# Patient Record
Sex: Male | Born: 1965
Health system: Southern US, Community
[De-identification: ages and names within clinical notes are randomized; demographics above are authoritative.]

## PROBLEM LIST (undated history)

## (undated) DIAGNOSIS — Q249 Congenital malformation of heart, unspecified: Secondary | ICD-10-CM

## (undated) DIAGNOSIS — I10 Essential (primary) hypertension: Secondary | ICD-10-CM

## (undated) DIAGNOSIS — I2699 Other pulmonary embolism without acute cor pulmonale: Secondary | ICD-10-CM

## (undated) DIAGNOSIS — F1010 Alcohol abuse, uncomplicated: Secondary | ICD-10-CM

## (undated) DIAGNOSIS — R079 Chest pain, unspecified: Secondary | ICD-10-CM

## (undated) DIAGNOSIS — G8929 Other chronic pain: Secondary | ICD-10-CM

## (undated) DIAGNOSIS — F329 Major depressive disorder, single episode, unspecified: Secondary | ICD-10-CM

## (undated) DIAGNOSIS — D369 Benign neoplasm, unspecified site: Secondary | ICD-10-CM

## (undated) DIAGNOSIS — Q231 Congenital insufficiency of aortic valve: Secondary | ICD-10-CM

## (undated) DIAGNOSIS — I4510 Unspecified right bundle-branch block: Secondary | ICD-10-CM

## (undated) DIAGNOSIS — Z720 Tobacco use: Secondary | ICD-10-CM

## (undated) DIAGNOSIS — F32A Depression, unspecified: Secondary | ICD-10-CM

## (undated) DIAGNOSIS — I4891 Unspecified atrial fibrillation: Secondary | ICD-10-CM

## (undated) DIAGNOSIS — Q2381 Bicuspid aortic valve: Secondary | ICD-10-CM

## (undated) DIAGNOSIS — F1910 Other psychoactive substance abuse, uncomplicated: Secondary | ICD-10-CM

## (undated) DIAGNOSIS — F1410 Cocaine abuse, uncomplicated: Secondary | ICD-10-CM

## (undated) DIAGNOSIS — F419 Anxiety disorder, unspecified: Secondary | ICD-10-CM

## (undated) DIAGNOSIS — I2510 Atherosclerotic heart disease of native coronary artery without angina pectoris: Secondary | ICD-10-CM

## (undated) DIAGNOSIS — J449 Chronic obstructive pulmonary disease, unspecified: Secondary | ICD-10-CM

## (undated) HISTORY — DX: Unspecified atrial fibrillation: I48.91

## (undated) HISTORY — DX: Other chronic pain: G89.29

## (undated) HISTORY — DX: Bicuspid aortic valve: Q23.81

## (undated) HISTORY — DX: Cocaine abuse, uncomplicated: F14.10

## (undated) HISTORY — PX: CARDIAC SURGERY: SHX584

## (undated) HISTORY — DX: Unspecified right bundle-branch block: I45.10

## (undated) HISTORY — DX: Atherosclerotic heart disease of native coronary artery without angina pectoris: I25.10

## (undated) HISTORY — DX: Depression, unspecified: F32.A

## (undated) HISTORY — DX: Other pulmonary embolism without acute cor pulmonale: I26.99

## (undated) HISTORY — DX: Major depressive disorder, single episode, unspecified: F32.9

## (undated) HISTORY — DX: Benign neoplasm, unspecified site: D36.9

## (undated) HISTORY — DX: Anxiety disorder, unspecified: F41.9

## (undated) HISTORY — DX: Chest pain, unspecified: R07.9

## (undated) HISTORY — PX: TONSILLECTOMY: SUR1361

## (undated) HISTORY — DX: Congenital insufficiency of aortic valve: Q23.1

## (undated) HISTORY — PX: APPENDECTOMY: SHX54

---

## 2002-11-11 ENCOUNTER — Emergency Department (HOSPITAL_COMMUNITY): Admission: EM | Admit: 2002-11-11 | Discharge: 2002-11-11 | Payer: Self-pay | Admitting: Emergency Medicine

## 2002-11-30 ENCOUNTER — Emergency Department (HOSPITAL_COMMUNITY): Admission: EM | Admit: 2002-11-30 | Discharge: 2002-11-30 | Payer: Self-pay | Admitting: Emergency Medicine

## 2010-03-18 ENCOUNTER — Emergency Department (HOSPITAL_BASED_OUTPATIENT_CLINIC_OR_DEPARTMENT_OTHER): Admission: EM | Admit: 2010-03-18 | Discharge: 2010-03-18 | Payer: Self-pay | Admitting: Emergency Medicine

## 2010-03-18 ENCOUNTER — Ambulatory Visit: Payer: Self-pay | Admitting: Diagnostic Radiology

## 2010-07-23 LAB — POCT CARDIAC MARKERS: CKMB, poc: <1 ng/mL — ABNORMAL LOW (ref 1.0–8.0)

## 2010-07-23 LAB — DIFFERENTIAL
Eosinophils Absolute: 0.1 10*3/uL (ref 0.0–0.7)
Eosinophils Relative: 2 % (ref 0–5)
Lymphocytes Relative: 36 % (ref 12–46)
Monocytes Absolute: 0.9 10*3/uL (ref 0.1–1.0)
Neutrophils Relative %: 46 % (ref 43–77)

## 2010-07-23 LAB — BASIC METABOLIC PANEL
CO2: 27 mEq/L (ref 19–32)
Calcium: 8.8 mg/dL (ref 8.4–10.5)
Creatinine, Ser: 1.1 mg/dL (ref 0.4–1.5)
GFR calc Af Amer: >60 mL/min (ref >60)
GFR calc non Af Amer: >60 mL/min (ref >60)
Sodium: 147 mEq/L — ABNORMAL HIGH (ref 135–145)

## 2010-07-23 LAB — POCT TOXICOLOGY PANEL: Cocaine: POSITIVE

## 2010-07-23 LAB — CBC
Hemoglobin: 14.3 g/dL (ref 13.0–17.0)
MCH: 31.8 pg (ref 26.0–34.0)
Platelets: 307 10*3/uL (ref 150–400)
RBC: 4.51 MIL/uL (ref 4.22–5.81)

## 2010-07-23 LAB — POCT B-TYPE NATRIURETIC PEPTIDE (BNP): B Natriuretic Peptide, POC: <5 pg/mL (ref 0–100)

## 2012-10-16 DIAGNOSIS — K219 Gastro-esophageal reflux disease without esophagitis: Secondary | ICD-10-CM | POA: Insufficient documentation

## 2014-07-02 ENCOUNTER — Observation Stay (HOSPITAL_COMMUNITY): Payer: Self-pay

## 2014-07-02 ENCOUNTER — Inpatient Hospital Stay (HOSPITAL_COMMUNITY)
Admission: EM | Admit: 2014-07-02 | Discharge: 2014-07-04 | DRG: 176 | Disposition: A | Discharge status: Home / Self Care | Payer: Self-pay | Attending: Internal Medicine | Admitting: Internal Medicine

## 2014-07-02 ENCOUNTER — Emergency Department (HOSPITAL_COMMUNITY): Payer: Self-pay

## 2014-07-02 ENCOUNTER — Encounter (HOSPITAL_COMMUNITY): Payer: Self-pay | Admitting: Adult Health

## 2014-07-02 DIAGNOSIS — E785 Hyperlipidemia, unspecified: Secondary | ICD-10-CM | POA: Diagnosis present

## 2014-07-02 DIAGNOSIS — F1911 Other psychoactive substance abuse, in remission: Secondary | ICD-10-CM | POA: Diagnosis present

## 2014-07-02 DIAGNOSIS — Z8249 Family history of ischemic heart disease and other diseases of the circulatory system: Secondary | ICD-10-CM

## 2014-07-02 DIAGNOSIS — I4510 Unspecified right bundle-branch block: Secondary | ICD-10-CM | POA: Diagnosis present

## 2014-07-02 DIAGNOSIS — F17210 Nicotine dependence, cigarettes, uncomplicated: Secondary | ICD-10-CM | POA: Diagnosis present

## 2014-07-02 DIAGNOSIS — R079 Chest pain, unspecified: Secondary | ICD-10-CM

## 2014-07-02 DIAGNOSIS — F1010 Alcohol abuse, uncomplicated: Secondary | ICD-10-CM | POA: Diagnosis present

## 2014-07-02 DIAGNOSIS — R0789 Other chest pain: Secondary | ICD-10-CM

## 2014-07-02 DIAGNOSIS — Z720 Tobacco use: Secondary | ICD-10-CM

## 2014-07-02 DIAGNOSIS — I2699 Other pulmonary embolism without acute cor pulmonale: Principal | ICD-10-CM | POA: Diagnosis present

## 2014-07-02 DIAGNOSIS — Z87891 Personal history of nicotine dependence: Secondary | ICD-10-CM | POA: Diagnosis present

## 2014-07-02 DIAGNOSIS — J449 Chronic obstructive pulmonary disease, unspecified: Secondary | ICD-10-CM | POA: Diagnosis present

## 2014-07-02 DIAGNOSIS — Z951 Presence of aortocoronary bypass graft: Secondary | ICD-10-CM

## 2014-07-02 DIAGNOSIS — Z888 Allergy status to other drugs, medicaments and biological substances status: Secondary | ICD-10-CM

## 2014-07-02 DIAGNOSIS — F17200 Nicotine dependence, unspecified, uncomplicated: Secondary | ICD-10-CM

## 2014-07-02 DIAGNOSIS — I452 Bifascicular block: Secondary | ICD-10-CM

## 2014-07-02 DIAGNOSIS — J438 Other emphysema: Secondary | ICD-10-CM

## 2014-07-02 DIAGNOSIS — F1910 Other psychoactive substance abuse, uncomplicated: Secondary | ICD-10-CM

## 2014-07-02 DIAGNOSIS — Q249 Congenital malformation of heart, unspecified: Secondary | ICD-10-CM

## 2014-07-02 DIAGNOSIS — I10 Essential (primary) hypertension: Secondary | ICD-10-CM | POA: Diagnosis present

## 2014-07-02 DIAGNOSIS — Z7982 Long term (current) use of aspirin: Secondary | ICD-10-CM

## 2014-07-02 HISTORY — DX: Other psychoactive substance abuse, uncomplicated: F19.10

## 2014-07-02 HISTORY — DX: Congenital malformation of heart, unspecified: Q24.9

## 2014-07-02 HISTORY — DX: Chronic obstructive pulmonary disease, unspecified: J44.9

## 2014-07-02 HISTORY — DX: Tobacco use: Z72.0

## 2014-07-02 HISTORY — DX: Essential (primary) hypertension: I10

## 2014-07-02 HISTORY — DX: Chest pain, unspecified: R07.9

## 2014-07-02 HISTORY — DX: Alcohol abuse, uncomplicated: F10.10

## 2014-07-02 LAB — BASIC METABOLIC PANEL
Anion gap: 7 (ref 5–15)
BUN: 10 mg/dL (ref 6–23)
CALCIUM: 9.2 mg/dL (ref 8.4–10.5)
CO2: 25 mmol/L (ref 19–32)
CREATININE: 0.95 mg/dL (ref 0.50–1.35)
Chloride: 104 mmol/L (ref 96–112)
GLUCOSE: 91 mg/dL (ref 70–99)
POTASSIUM: 3.5 mmol/L (ref 3.5–5.1)
Sodium: 136 mmol/L (ref 135–145)

## 2014-07-02 LAB — BRAIN NATRIURETIC PEPTIDE: B NATRIURETIC PEPTIDE 5: 21.6 pg/mL (ref 0.0–100.0)

## 2014-07-02 LAB — CBC
HEMATOCRIT: 42.2 % (ref 39.0–52.0)
Hemoglobin: 14.2 g/dL (ref 13.0–17.0)
MCH: 31.5 pg (ref 26.0–34.0)
MCHC: 33.6 g/dL (ref 30.0–36.0)
MCV: 93.6 fL (ref 78.0–100.0)
PLATELETS: 275 10*3/uL (ref 150–400)
RBC: 4.51 MIL/uL (ref 4.22–5.81)
RDW: 14.1 % (ref 11.5–15.5)
WBC: 6.7 10*3/uL (ref 4.0–10.5)

## 2014-07-02 LAB — I-STAT TROPONIN, ED: TROPONIN I, POC: 0.01 ng/mL (ref 0.00–0.08)

## 2014-07-02 LAB — RAPID URINE DRUG SCREEN, HOSP PERFORMED
AMPHETAMINES: NOT DETECTED
BENZODIAZEPINES: NOT DETECTED
Barbiturates: NOT DETECTED
COCAINE: NOT DETECTED
Opiates: POSITIVE — AB
TETRAHYDROCANNABINOL: NOT DETECTED

## 2014-07-02 LAB — TROPONIN I: Troponin I: <0.03 ng/mL (ref <0.031)

## 2014-07-02 LAB — D-DIMER, QUANTITATIVE (NOT AT ARMC): D DIMER QUANT: 0.28 ug{FEU}/mL (ref 0.00–0.48)

## 2014-07-02 LAB — HEPARIN LEVEL (UNFRACTIONATED): Heparin Unfractionated: 0.71 IU/mL — ABNORMAL HIGH (ref 0.30–0.70)

## 2014-07-02 MED ORDER — PANTOPRAZOLE SODIUM 40 MG PO TBEC
40.0000 mg | DELAYED_RELEASE_TABLET | Freq: Every day | ORAL | Status: DC
Start: 2014-07-02 — End: 2014-07-03
  Administered 2014-07-02 – 2014-07-03 (×2): 40 mg via ORAL
  Filled 2014-07-02 (×2): qty 1

## 2014-07-02 MED ORDER — COLCHICINE 0.6 MG PO TABS
0.6000 mg | ORAL_TABLET | Freq: Two times a day (BID) | ORAL | Status: DC
  Administered 2014-07-02 – 2014-07-03 (×3): 0.6 mg via ORAL
  Filled 2014-07-02 (×3): qty 1

## 2014-07-02 MED ORDER — NITROGLYCERIN 0.4 MG SL SUBL
0.4000 mg | SUBLINGUAL_TABLET | SUBLINGUAL | Status: DC | PRN
  Administered 2014-07-02: 0.4 mg via SUBLINGUAL
  Filled 2014-07-02: qty 1

## 2014-07-02 MED ORDER — FENTANYL CITRATE 0.05 MG/ML IJ SOLN
12.5000 ug | INTRAMUSCULAR | Status: DC | PRN
Start: 2014-07-02 — End: 2014-07-03
  Administered 2014-07-02 (×2): 12.5 ug via INTRAVENOUS
  Filled 2014-07-02 (×3): qty 2

## 2014-07-02 MED ORDER — HEPARIN BOLUS VIA INFUSION
5000.0000 [IU] | Freq: Once | INTRAVENOUS | Status: AC
  Administered 2014-07-02: 5000 [IU] via INTRAVENOUS
  Filled 2014-07-02: qty 5000

## 2014-07-02 MED ORDER — IBUPROFEN 200 MG PO TABS
400.0000 mg | ORAL_TABLET | Freq: Three times a day (TID) | ORAL | Status: DC
  Administered 2014-07-02 – 2014-07-03 (×3): 400 mg via ORAL
  Filled 2014-07-02 (×3): qty 2

## 2014-07-02 MED ORDER — HYDROMORPHONE HCL 1 MG/ML IJ SOLN
1.0000 mg | Freq: Once | INTRAMUSCULAR | Status: AC
Start: 2014-07-02 — End: 2014-07-02
  Administered 2014-07-02: 1 mg via INTRAVENOUS
  Filled 2014-07-02: qty 1

## 2014-07-02 MED ORDER — HEPARIN (PORCINE) IN NACL 100-0.45 UNIT/ML-% IJ SOLN
1150.0000 [IU]/h | INTRAMUSCULAR | Status: DC
  Administered 2014-07-02 (×2): 1400 [IU]/h via INTRAVENOUS
  Filled 2014-07-02 (×2): qty 250

## 2014-07-02 MED ORDER — MORPHINE SULFATE 2 MG/ML IJ SOLN
2.0000 mg | INTRAMUSCULAR | Status: DC | PRN
  Filled 2014-07-02: qty 2

## 2014-07-02 MED ORDER — ASPIRIN 325 MG PO TABS
325.0000 mg | ORAL_TABLET | ORAL | Status: AC
  Administered 2014-07-02: 325 mg via ORAL
  Filled 2014-07-02: qty 1

## 2014-07-02 MED ORDER — IOHEXOL 350 MG/ML SOLN
100.0000 mL | Freq: Once | INTRAVENOUS | Status: AC | PRN
  Administered 2014-07-02: 100 mL via INTRAVENOUS

## 2014-07-02 MED ORDER — ONDANSETRON HCL 4 MG/2ML IJ SOLN
4.0000 mg | Freq: Four times a day (QID) | INTRAMUSCULAR | Status: DC | PRN
Start: 2014-07-02 — End: 2014-07-04

## 2014-07-02 MED ORDER — ACETAMINOPHEN 325 MG PO TABS: 650.0000 mg | ORAL_TABLET | ORAL | Status: DC | PRN

## 2014-07-02 MED ORDER — HEPARIN SODIUM (PORCINE) 5000 UNIT/ML IJ SOLN: 5000.0000 [IU] | Freq: Three times a day (TID) | INTRAMUSCULAR | Status: DC

## 2014-07-02 MED ORDER — DIPHENHYDRAMINE HCL 50 MG/ML IJ SOLN
12.5000 mg | Freq: Once | INTRAMUSCULAR | Status: AC
  Administered 2014-07-02: 12.5 mg via INTRAVENOUS
  Filled 2014-07-02: qty 1

## 2014-07-02 MED ORDER — MORPHINE SULFATE 4 MG/ML IJ SOLN
4.0000 mg | Freq: Once | INTRAMUSCULAR | Status: AC
  Administered 2014-07-02: 4 mg via INTRAVENOUS
  Filled 2014-07-02: qty 1

## 2014-07-02 MED ORDER — FENTANYL CITRATE 0.05 MG/ML IJ SOLN
12.5000 ug | INTRAMUSCULAR | Status: DC | PRN
  Administered 2014-07-02 (×4): 12.5 ug via INTRAVENOUS
  Filled 2014-07-02 (×4): qty 2

## 2014-07-02 NOTE — ED Notes (Signed)
Pt c/o intermittent squeezing chest pain radiating into left arm for 2 days. Hx of cardiac surgeries at an infant, sts "all I know is they plug a whole in my heart." Pain associated with increased shortness of breath, diaphoresis, nausea, and dizziness. Reports similar episodes in the past; pain tonight is worse. Pt clammy on assessment

## 2014-07-02 NOTE — Progress Notes (Signed)
PROGRESS NOTE  Jeremy Booker:[id number] DOB: 1965/06/04 DOA: 07/02/2014 PCP: No PCP Per Patient  Brief history 49 year old male with a history of hypertension, tobacco use, COPD presents with 3 day history of chest discomfort. Initially, his chest discomfort was off and on and dull in nature. However after church on the day of admission, he began having constant left-sided and substernal chest discomfort that was sharp and constant with some shortness of breath. He denied any syncope. Patient states that he has a history of cardiac surgery to fix a "hole in his heart"when he was a child. He denies any fevers, chills, coughing, hemoptysis. He continues to smoke approximately 5 cigarettes per day, but has approximately 30-pack-year history. The patient states the only Dilaudid relieved his pain yesterday. The patient previously used cocaine, last used 3 months ago. Urine drug screen at the time of admission was negative. BMP and CBC were unremarkable. Chest x-ray was negative. Initial troponins are negative. EKG showed right bundle branch block.since hospitalization, the patient continues to complain of persistent sharp chest discomfort.  Assessment/Plan: Atypical chest pain -Continue to cycle troponins -Echo -CTA chest -consulted cardiology in setting of abnormal EKG and abnormal heart exam (+S3?)--spoke with Dr. Bronson Ing -CXR--neg -continue ASA Tobacco use -Tobacco cessation discussed RBBB -Echocardiogram History of cocaine use  -repeat urine drug screen negative   Family Communication:   Pt at beside Disposition Plan:   Home when medically stable       Procedures/Studies: Dg Chest Port 1 View  07/02/2014   CLINICAL DATA:  Left-sided chest pain, duration 3 days.  EXAM: PORTABLE CHEST - 1 VIEW  COMPARISON:  03/18/2010  FINDINGS: A single AP portable view of the chest demonstrates no focal airspace consolidation or alveolar edema. The lungs are grossly clear. There is  no large effusion or pneumothorax. There is unchanged cardiomegaly. Cardiac and mediastinal contours are otherwise unremarkable.  IMPRESSION: No active disease.   Electronically Signed   By: Jeremy Booker M.D.   On: 07/02/2014 03:43         Subjective: Patient continues to complain of chest pain. He has some mild shortness of breath. Denies any headache, nausea, vomiting, diarrhea, abdominal pain, dysuria, hematuria. No rashes.   Objective: Filed Vitals:   07/02/14 0515 07/02/14 0530 07/02/14 0545 07/02/14 0624  BP: 144/77 136/74 136/80 135/96  Pulse: 70 58 63 57  Temp:    97.8 F (36.6 C)  TempSrc:    Oral  Resp: 17 24 18 18   Height:    5\' 11"  (1.803 m)  Weight:    78.563 kg (173 lb 3.2 oz)  SpO2: 100% 100% 99% 99%    Intake/Output Summary (Last 24 hours) at 07/02/14 0756 Last data filed at 07/02/14 0532  Gross per 24 hour  Intake      0 ml  Output    325 ml  Net   -325 ml   Weight change:  Exam:   General:  Pt is alert, follows commands appropriately, not in acute distress  HEENT: No icterus, No thrush,  [place]/AT  Cardiovascular: RRR, S1/S2, no rubs, no gallops  Respiratory: CTA bilaterally, no wheezing, no crackles, no rhonchi  Abdomen: Soft/+BS, non tender, non distended, no guarding  Extremities: No edema, No lymphangitis, No petechiae, No rashes, no synovitis  Data Reviewed: Basic Metabolic Panel:  Recent Labs Lab 07/02/14 0259  NA 136  K 3.5  CL 104  CO2 25  GLUCOSE  91  BUN 10  CREATININE 0.95  CALCIUM 9.2   Liver Function Tests: No results for input(s): AST, ALT, ALKPHOS, BILITOT, PROT, ALBUMIN in the last 168 hours. No results for input(s): LIPASE, AMYLASE in the last 168 hours. No results for input(s): AMMONIA in the last 168 hours. CBC:  Recent Labs Lab 07/02/14 0259  WBC 6.7  HGB 14.2  HCT 42.2  MCV 93.6  PLT 275   Cardiac Enzymes:  Recent Labs Lab 07/02/14 0551  TROPONINI <0.03   BNP: Invalid input(s):  POCBNP CBG: No results for input(s): GLUCAP in the last 168 hours.  No results found for this or any previous visit (from the past 240 hour(s)).   Scheduled Meds: . heparin  5,000 Units Subcutaneous 3 times per day   Continuous Infusions:    Jeremy Freundlich, DO  [facility] Pager [phone]  If 7PM-7AM, please contact night-coverage [email or web address] Password [facility] 07/02/2014, 7:56 AM

## 2014-07-02 NOTE — Progress Notes (Signed)
Pt had reaction with Morphine as per report from HS  RN . Pt was given Benadryl IV to counteract.

## 2014-07-02 NOTE — Progress Notes (Signed)
ANTICOAGULATION CONSULT NOTE - Follow Up Consult  Pharmacy Consult for heparin Indication: pulmonary embolus  Allergies  Allergen Reactions  . Naprosyn [Naproxen] Rash    Patient has taken ibuprofen and tolerates that just fine    Patient Measurements: Height: 5\' 11"  (180.3 cm) Weight: 173 lb 3.2 oz (78.563 kg) IBW/kg (Calculated) : 75.3 Heparin Dosing Weight: 78.6 kg  Vital Signs: Temp: 97.9 F (36.6 C) (02/21 1500) Temp Source: Oral (02/21 1500) BP: 140/89 mmHg (02/21 1500) Pulse Rate: 66 (02/21 1500)  Labs:  Recent Labs  07/02/14 0259 07/02/14 0551 07/02/14 0840 07/02/14 1320 07/02/14 1837  HGB 14.2  --   --   --   --   HCT 42.2  --   --   --   --   PLT 275  --   --   --   --   HEPARINUNFRC  --   --   --   --  0.71*  CREATININE 0.95  --   --   --   --   TROPONINI  --  <0.03 <0.03 <0.03  --     Estimated Creatinine Clearance: 102.4 mL/min (by C-G formula based on Cr of 0.95).   Medications:  Scheduled:  . colchicine  0.6 mg Oral BID  . heparin  5,000 Units Subcutaneous 3 times per day  . ibuprofen  400 mg Oral TID  . pantoprazole  40 mg Oral Q0600   Infusions:  . heparin 1,400 Units/hr (07/02/14 1359)    Assessment: 49 yo male with PE is currently on supratherapeutic heparin.  Heparin level is 0.71 Goal of Therapy:  Heparin level 0.3-0.7 units/ml Monitor platelets by anticoagulation protocol: Yes   Plan:  - Reduce heparin to 1300 units/hr. - 6hr heparin level after rate is changed  Sequoyah Ramone, Tsz-Yin 07/02/2014,7:49 PM

## 2014-07-02 NOTE — ED Notes (Signed)
Presents with 3 days of left sided sharp pain associated with SOB,being still makespain and SOB worse.pain is constant and described as sharp. HX of 2 CABG

## 2014-07-02 NOTE — ED Provider Notes (Signed)
This chart was scribed for [place], DO by Peyton Bottoms, ED Scribe. This patient was seen in room B19C/B19C and the patient's care was started at 3:09 AM.   TIME SEEN: 3:09 AM   CHIEF COMPLAINT: Chest Pain  HPI: Jeremy Booker is a 49 y.o. male with a PMHx of history of heart surgery as a child to fix a "hole in my heart", hypertension, COPD, hyperlipidemia, tobacco use, family history of CAD who presents to the emergency department with intermittent episodes of left-sided sharp and pressure-like chest pain with radiation into his left arm for the past 2-3 days that is worse with exertion. He does have shortness of breath, diaphoresis, nausea and dizziness. Has had similar symptoms in the past and has had a cardiac catheterization in 2010 in [place] which he reports is normal. States he has not had this pain "in a while". No lower extremity swelling or pain.  No prior history of PE or DVT, recent prolonged immobilization such as long flight or hospitalization, fracture, surgery, trauma. No recent stress test.  States he does not have a primary care physician here.  Patient has not had a CABG despite nursing notes.   EKG Interpretation  Date/Time:  Sunday July 02 2014 02:55:10 EST Ventricular Rate:  62 PR Interval:  205 QRS Duration: 168 QT Interval:  440 QTC Calculation: 447 R Axis:   -52 Text Interpretation:  Sinus rhythm Borderline prolonged PR interval Left atrial enlargement RBBB and LAFB Lateral leads are also involved Baseline wander in lead(s) II III aVF V5 No change since 2011 Confirmed by Li Fragoso,  DO, Ladaysha Soutar ([id number]) on 07/02/2014 2:57:56 AM       ROS: See HPI Constitutional: no fever  Eyes: no drainage  ENT: no runny nose   Cardiovascular:  Chest pain  Resp: SOB  GI: no vomiting GU: no dysuria Integumentary: no rash  Allergy: no hives  Musculoskeletal: no leg swelling  Neurological: no slurred speech ROS otherwise negative  PAST MEDICAL HISTORY/PAST  SURGICAL HISTORY:  Past Medical History  Diagnosis Date  . COPD (chronic obstructive pulmonary disease)   . Hypertension     MEDICATIONS:  Prior to Admission medications   Not on File    ALLERGIES:  Allergies  Allergen Reactions  . Naprosyn [Naproxen]     SOCIAL HISTORY:  History  Substance Use Topics  . Smoking status: Current Some Day Smoker  . Smokeless tobacco: Not on file  . Alcohol Use: No    FAMILY HISTORY: History reviewed. No pertinent family history.  EXAM: Triage Vitals: BP 132/74 mmHg  Pulse 56  Temp(Src) 97.3 F (36.3 C) (Oral)  Resp 22  SpO2 100%  CONSTITUTIONAL: Alert and oriented and responds appropriately to questions. Well-appearing; well-nourished, appears uncomfortable but is nontoxic HEAD: Normocephalic EYES: Conjunctivae clear, PERRL ENT: normal nose; no rhinorrhea; moist mucous membranes; pharynx without lesions noted NECK: Supple, no meningismus, no LAD  CARD: RRR; S1 and S2 appreciated; no murmurs, no clicks, no rubs, no gallops; No calf tenderness or swelling RESP: Normal chest excursion without splinting or tachypnea; breath sounds clear and equal bilaterally; no wheezes, no rhonchi, no rales, no hypoxia or respiratory distress, chest wall is nontender to palpation ABD/GI: Normal bowel sounds; non-distended; soft, non-tender, no rebound, no guarding BACK:  The back appears normal and is non-tender to palpation, there is no CVA tenderness EXT: Normal ROM in all joints; non-tender to palpation; no edema; normal capillary refill; no cyanosis, no lower extremity swelling or pain  SKIN: Normal color for age and race; warm NEURO: Moves all extremities equally PSYCH: The patient's mood and manner are appropriate. Grooming and personal hygiene are appropriate.  MEDICAL DECISION MAKING: Patient here with multiple resector's for ACS he presents with chest pain with shortness of breath, nausea, diaphoresis and dizziness. EKG shows bifascicular  block that is unchanged compared to prior. We'll obtain cardiac labs, chest x-ray, d-dimer. Anticipate admission.  ED PROGRESS:   Patient given aspirin. Reports some improvement in pain with nitroglycerin but states nitroglycerin made his shortness of breath worse. His labs are unremarkable including a negative troponin, normal BNP and a negative d-dimer. Chest x-ray clear. We'll give morphine for pain control.   Discussed with Dr. Alcario Drought with hospitalist service for admission to telemetry, observation for chest pain rule out. Updated patient and family.     EKG Interpretation  Date/Time:  Sunday July 02 2014 04:03:34 EST Ventricular Rate:  64 PR Interval:  207 QRS Duration: 171 QT Interval:  469 QTC Calculation: 484 R Axis:   -46 Text Interpretation:  Sinus rhythm Ventricular premature complex Borderline prolonged PR interval Probable left atrial enlargement RBBB and LAFB No significant change since last tracing Confirmed by Sunday Klos,  DO, Audrick Lamoureaux ([id number]) on 07/02/2014 4:10:13 AM         I personally performed the services described in this documentation, which was scribed in my presence. The recorded information has been reviewed and is accurate.     Nelson, DO 07/02/14 [phone]

## 2014-07-02 NOTE — Progress Notes (Signed)
ANTICOAGULATION CONSULT NOTE - Initial Consult  Pharmacy Consult for Heparin Indication: pulmonary embolus  Allergies  Allergen Reactions  . Naprosyn [Naproxen] Rash    Patient Measurements: Height: 5\' 11"  (180.3 cm) Weight: 173 lb 3.2 oz (78.563 kg) IBW/kg (Calculated) : 75.3  Vital Signs: Temp: 97.8 F (36.6 C) (02/21 0624) Temp Source: Oral (02/21 0624) BP: 135/96 mmHg (02/21 0624) Pulse Rate: 57 (02/21 0624)  Labs:  Recent Labs  07/02/14 0259 07/02/14 0551 07/02/14 0840  HGB 14.2  --   --   HCT 42.2  --   --   PLT 275  --   --   CREATININE 0.95  --   --   TROPONINI  --  <0.03 <0.03    Estimated Creatinine Clearance: 102.4 mL/min (by C-G formula based on Cr of 0.95).   Medical History: Past Medical History  Diagnosis Date  . COPD (chronic obstructive pulmonary disease)   . Hypertension     a. Dx 6 yrs ago - untreated.  . Tobacco abuse     a. 30 pack years.  Marland Kitchen ETOH abuse     a. 06/2014 currently a few bottles of wine/week.  . Drug abuse     a. 06/2014 Cocaine/Marijuana - last used a few months ago.  . Congenital heart disease     a. thinks he had "a hole in my heart" s/p surgical correction @ age 54 and then age 17.  . Chest pain     a. 2010 s/p cath in [place], [place] - reportedly nl.    Medications:  No prescriptions prior to admission    Assessment: 49 yo M admitted 07/02/2014  With CP.  Pharmacy consulted to dose heparin for a PE confirmed on CT.  Coag: PE, CT positive, CBC wnl  Goal of Therapy:  Heparin level 0.3-0.7 units/ml Monitor platelets by anticoagulation protocol: Yes   Plan:  Give 5000 units bolus x 1 Start heparin infusion at 1400 units/hr Check anti-Xa level in 6 hours and daily while on heparin Continue to monitor H&H and platelets  Thank you for allowing pharmacy to be a part of this patients care team.  Rowe Robert Pharm.D., BCPS, AQ-Cardiology Clinical Pharmacist 07/02/2014 11:58 AM Pager: [phone] Phone: [phone]

## 2014-07-02 NOTE — ED Notes (Signed)
Dr. Gardner at bedside 

## 2014-07-02 NOTE — ED Notes (Signed)
Repeat EKG given to Dr.Ward; pt becomes more short of breath with wide pvc's; placed on 2L oxygen; Pt sats remained 100%

## 2014-07-02 NOTE — Progress Notes (Signed)
Utilization Review Completed.   Sutter Ahlgren, RN, BSN Nurse Case Manager  

## 2014-07-02 NOTE — ED Notes (Signed)
Pt c/o squeezing, pressure in left side of chest associated with shortness of breath. MD aware; medicated per [facility]

## 2014-07-02 NOTE — Consult Note (Signed)
CARDIOLOGY CONSULT NOTE   Patient ID: Jeremy Booker MRN: [id number], DOB/AGE: 48-Jul-1967   Admit date: 07/02/2014 Date of Consult: 07/02/2014  Primary Physician: No PCP Per Patient Primary Cardiologist: new - seen by Court Joy, MD   Pt. Profile  49 y/o male with a h/o congenital heart disease s/p surgical correction in his youth, who presented to [facility] yesterday 2/2 a 3 day h/o intermittent chest pain.  Problem List  Past Medical History  Diagnosis Date  . COPD (chronic obstructive pulmonary disease)   . Hypertension     a. Dx 6 yrs ago - untreated.  . Tobacco abuse     a. 30 pack years.  Marland Kitchen ETOH abuse     a. 06/2014 currently a few bottles of wine/week.  . Drug abuse     a. 06/2014 Cocaine/Marijuana - last used a few months ago.  . Congenital heart disease     a. thinks he had "a hole in my heart" s/p surgical correction @ age 17 and then age 76.  . Chest pain     a. 2010 s/p cath in [place], [place] - reportedly nl.    History reviewed. No pertinent past surgical history.   Allergies  Allergies  Allergen Reactions  . Naprosyn [Naproxen] Rash    HPI   49 y/o male with the above problem list.  He has a h/o congenital heart disease and underwent corrective surgery @ [facility] @ age 44 and then again @ age 82.  He believes he had a "hole in [his] heart."  He says that all of his life, he has had issues with occasional chest pain.  In 2010, he was evaluated in [place] for c/p and underwent cath reportedly revealing nl cors (per pt).  He has a h/o heavy tobacco, etoh, and drug abuse.  Though he has cut back significantly, he continues to smoke a few cigarettes/wk, binges one or two times/wk on several bottles of wine, and uses cocaine and marijuana, though he says he last used drugs "a couple months ago."  ~ 2 wks ago, he drank 1-2 bottles of wine in one night and the following day he developed severe chest pain, which lasted several hours and then eventually eased off.  He drank  heavily again on 2/17 and awoke 2/18 with severe midsternal chest pain.  This came and went throughout Thursday and Friday but then became more constant and severe on Saturday.  He also developed left arm numbness and dyspnea.  Chest pain has been worse with position changes, palpation, and deep breathing.  He presented to the ED just before 3am this morning where troponin, CXR, and d dimer were normal.  ECG is notable for RBBB (? Chronicity). Chest pain has been constant since admission.  He denies palpitations, pnd, orthopnea, n, v, syncope, edema, weight gain, or early satiety.   Inpatient Medications  . heparin  5,000 Units Subcutaneous 3 times per day    Family History Family History  Problem Relation Age of Onset  . Hypertension Mother     alive  . Other Father     died in late 45's - ? cause     Social History History   Social History  . Marital Status: Married    Spouse Name: N/A  . Number of Children: N/A  . Years of Education: N/A   Occupational History  . Not on file.   Social History Main Topics  . Smoking status: Current Some Day Smoker  .  Smokeless tobacco: Not on file     Comment: ~ 30 pack year hx - currently smoking 5 cigarettes/wk (06/2014).  . Alcohol Use: 0.0 oz/week    0 Standard drinks or equivalent per week     Comment: Previously drank heavily on a daily basis.  Now drinks a few bottles of wine 1-2 x /wk (06/2014)  . Drug Use: Yes     Comment: uses marijuana and cocaine - last used a few mos ago (06/2014)  . Sexual Activity: Not on file   Other Topics Concern  . Not on file   Social History Narrative   Was living in [place].  Now living with girlfriend in [place].  Unemployed and possibly pending disability.     Review of Systems  General:  Has had chills this AM.  No fever, night sweats or weight changes.  Cardiovascular:  +++ chest pain, dyspnea on exertion, no edema, orthopnea, palpitations, paroxysmal nocturnal dyspnea. Dermatological: No  rash, lesions/masses Respiratory: No cough, +++ dyspnea Urologic: No hematuria, dysuria Abdominal:   +++brbpr - says he has a h/o hemorrhoids.  No nausea, vomiting, diarrhea, melena, or hematemesis Neurologic:  No visual changes, wkns, changes in mental status. All other systems reviewed and are otherwise negative except as noted above.  Physical Exam  Blood pressure 135/96, pulse 57, temperature 97.8 F (36.6 C), temperature source Oral, resp. rate 18, height 5\' 11"  (1.803 m), weight 173 lb 3.2 oz (78.563 kg), SpO2 99 %.  General: Pleasant, NAD Psych: Normal affect. Neuro: Alert and oriented X 3. Moves all extremities spontaneously. HEENT: Normal  Neck: Supple without bruits or JVD. Lungs:  Resp regular and unlabored, CTA. Heart: RRR no s3, s4, or murmurs. Chest Wall:  Chest wall is very tender to touch over llsb ~ 5th ICS extending out to [facility]. Abdomen: Soft, non-tender, non-distended, BS + x 4.  Extremities: No clubbing, cyanosis or edema. DP/PT/Radials 2+ and equal bilaterally.  Labs   Recent Labs  07/02/14 0551  TROPONINI <0.03   Lab Results  Component Value Date   WBC 6.7 07/02/2014   HGB 14.2 07/02/2014   HCT 42.2 07/02/2014   MCV 93.6 07/02/2014   PLT 275 07/02/2014     Recent Labs Lab 07/02/14 0259  NA 136  K 3.5  CL 104  CO2 25  BUN 10  CREATININE 0.95  CALCIUM 9.2  GLUCOSE 91    Lab Results  Component Value Date   DDIMER 0.28 07/02/2014    Radiology/Studies  Dg Chest Port 1 View  07/02/2014   CLINICAL DATA:  Left-sided chest pain, duration 3 days.  EXAM: PORTABLE CHEST - 1 VIEW  COMPARISON:  03/18/2010  FINDINGS: A single AP portable view of the chest demonstrates no focal airspace consolidation or alveolar edema. The lungs are grossly clear. There is no large effusion or pneumothorax. There is unchanged cardiomegaly. Cardiac and mediastinal contours are otherwise unremarkable.  IMPRESSION: No active disease.   Electronically Signed   By: [name] M.D.   On: 07/02/2014 03:43    ECG  RSR, 64, PVC, RBBB, LAE, LAD, LAFB.  ASSESSMENT AND PLAN  1.  Atypical Chest Pain:  Pt presented to [facility] overnight with a 3 day h/o progressive, mostly constant, left sided chest pain that is reproducible with palpation, deep breathing, and position changes.  ECG is non-ischemic.  Troponin and D dimer have been normal.  He reports a long history of this type of chest pain with prior evaluation by  cath in 2010 @ [facility] in [place], which he believes was nl.  Symptoms seem to be provoked by binge drinking.  He denies any recent trauma.  Provided that troponin remains normal, would not pursue additional ischemic evaluation.  Agree with echo to re-evalute congenital heart disease.  CTA pending per medicine.  Add PPI.  ? GI eval given etoh hx.  2.  Congenital heart disease:  S/p corrective surgeries @ age 25 and 32.  Echo pending.  3.  HTN:  Was prev on meds but none in several years.  Follow bp trend and consider chlorthalidone if pressures trending up.  So far he's been 130's to low 140's.  4.  Polysubstance abuse:  Long h/o tobacco, etoh, and cocaine/marijuana abuse.  Has cut back on all agents but continues to smoke a few cigarettes/week and binges on 1-2 bottles/wine at least once/wk.  Hasn't used drugs in a few months.  Complete cessation advised.  5.  COPD:  Not actively wheezing.  Signed, Murray Hodgkins, NP 07/02/2014, 8:44 AM   The patient was seen and examined, and I agree with the assessment and plan as documented above, with modifications as noted below. Pt admitted with chest pain very atypical for anginal pain. Troponins normal. Pain worse with deep inspiration and no correlation with exertion. History of heart surgery as a child. Physical exam notable for fixed split S2. Does have RBBB (QRS duration 171 ms). ECG demonstrates sinus rhythm with borderline PR prolongation with RBBB and LAFB.  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 Will obtain echocardiogram for further clarification.  This does not represent anginal pain. No rub to suggest pericarditis with minimal (if any) precordial PR depression. I will try colchicine and NSAIDS. Will f/u chest CT.

## 2014-07-02 NOTE — H&P (Signed)
[facility] History and Physical  DUTCH ING TDD:[id number] DOB: Mar 31, 1966 DOA: 07/02/2014  Referring physician: EDP PCP: No PCP Per Patient   Chief Complaint: Chest pain   HPI: Jeremy Booker is a 49 y.o. male with history of heart surgery as a child, PE in the past.  Patient presents to the ED with 3 day history of worsening chest pain and SOB.  There is associated L arm numbness.  Pain is pressure like sensation, pain has been more constant in the past 3 days than it has over past few years.  He states this isnt the first time that he has had this pain although its worse this time than it has been in the past.  He is diaphoretic at baseline he states.  No associated nausea or vomiting.  Cardiac cath in 2010 in [place], [place].  Review of Systems: Systems reviewed.  As above, otherwise negative  Past Medical History  Diagnosis Date  . COPD (chronic obstructive pulmonary disease)   . Hypertension    Past Surgical History  Procedure Laterality Date  . Coronary artery bypass graft     Social History:  reports that he has been smoking.  He does not have any smokeless tobacco history on file. He reports that he does not drink alcohol or use illicit drugs.  Allergies  Allergen Reactions  . Naprosyn [Naproxen] Rash    History reviewed. No pertinent family history.   Prior to Admission medications   Not on File   Physical Exam: Filed Vitals:   07/02/14 0530  BP: 136/74  Pulse: 58  Temp:   Resp: 24    BP 136/74 mmHg  Pulse 58  Temp(Src) 97.3 F (36.3 C) (Oral)  Resp 24  SpO2 100%  General Appearance:    Sleeping but arrousable after pain meds given, oriented, no distress, appears stated age  Head:    Normocephalic, atraumatic  Eyes:    PERRL, EOMI, sclera non-icteric        Nose:   Nares without drainage or epistaxis. Mucosa, turbinates normal  Throat:   Moist mucous membranes. Oropharynx without erythema or exudate.  Neck:   Supple. No carotid bruits.  No  thyromegaly.  No lymphadenopathy.   Back:     No CVA tenderness, no spinal tenderness  Lungs:     Clear to auscultation bilaterally, without wheezes, rhonchi or rales  Chest wall:    No tenderness to palpitation  Heart:    Regular rate and rhythm without murmurs, gallops, rubs  Abdomen:     Soft, non-tender, nondistended, normal bowel sounds, no organomegaly  Genitalia:    deferred  Rectal:    deferred  Extremities:   No clubbing, cyanosis or edema.  Pulses:   2+ and symmetric all extremities  Skin:   Skin color, texture, turgor normal, no rashes or lesions  Lymph nodes:   Cervical, supraclavicular, and axillary nodes normal  Neurologic:   CNII-XII intact. Normal strength, sensation and reflexes      throughout    Labs on Admission:  Basic Metabolic Panel:  Recent Labs Lab 07/02/14 0259  NA 136  K 3.5  CL 104  CO2 25  GLUCOSE 91  BUN 10  CREATININE 0.95  CALCIUM 9.2   Liver Function Tests: No results for input(s): AST, ALT, ALKPHOS, BILITOT, PROT, ALBUMIN in the last 168 hours. No results for input(s): LIPASE, AMYLASE in the last 168 hours. No results for input(s): AMMONIA in the last 168 hours. CBC:  Recent Labs Lab 07/02/14 0259  WBC 6.7  HGB 14.2  HCT 42.2  MCV 93.6  PLT 275   Cardiac Enzymes: No results for input(s): CKTOTAL, CKMB, CKMBINDEX, TROPONINI in the last 168 hours.  BNP (last 3 results) No results for input(s): PROBNP in the last 8760 hours. CBG: No results for input(s): GLUCAP in the last 168 hours.  Radiological Exams on Admission: Dg Chest Port 1 View  07/02/2014   CLINICAL DATA:  Left-sided chest pain, duration 3 days.  EXAM: PORTABLE CHEST - 1 VIEW  COMPARISON:  03/18/2010  FINDINGS: A single AP portable view of the chest demonstrates no focal airspace consolidation or alveolar edema. The lungs are grossly clear. There is no large effusion or pneumothorax. There is unchanged cardiomegaly. Cardiac and mediastinal contours are otherwise  unremarkable.  IMPRESSION: No active disease.   Electronically Signed   By: [name] M.D.   On: 07/02/2014 03:43    EKG: Independently reviewed. RBBB and LAFB, appears unchanged since 2011.  Assessment/Plan Active Problems:   Chest pain   1. Chest pain - unclear cause, D.Dimer negative, so PE unlikely.  Dissection also unlikely with negative D.Dimer, negative Troponin, and the history of identical symptoms in the past. 1. Chest pain obs pathway 2. Tele monitor 3. Serial troponins 4. NPO in case a stress test is desired 5. 2d echo ordered    Code Status: Full  Family Communication: Family at bedside Disposition Plan: Admit to obs   Time spent: 50 min  GARDNER, JARED M. [facility] Pager [phone]  If 7AM-7PM, please contact the day team taking care of the patient [email or web address] Password [facility] 07/02/2014, 5:42 AM

## 2014-07-03 DIAGNOSIS — R079 Chest pain, unspecified: Secondary | ICD-10-CM

## 2014-07-03 DIAGNOSIS — R0789 Other chest pain: Secondary | ICD-10-CM

## 2014-07-03 DIAGNOSIS — I2699 Other pulmonary embolism without acute cor pulmonale: Secondary | ICD-10-CM | POA: Diagnosis present

## 2014-07-03 LAB — CBC
HCT: 42.5 % (ref 39.0–52.0)
Hemoglobin: 14.2 g/dL (ref 13.0–17.0)
MCH: 31.3 pg (ref 26.0–34.0)
MCHC: 33.4 g/dL (ref 30.0–36.0)
MCV: 93.8 fL (ref 78.0–100.0)
Platelets: 288 10*3/uL (ref 150–400)
RBC: 4.53 MIL/uL (ref 4.22–5.81)
RDW: 13.9 % (ref 11.5–15.5)
WBC: 5.2 10*3/uL (ref 4.0–10.5)

## 2014-07-03 LAB — BASIC METABOLIC PANEL
ANION GAP: 6 (ref 5–15)
BUN: 7 mg/dL (ref 6–23)
CALCIUM: 9 mg/dL (ref 8.4–10.5)
CO2: 28 mmol/L (ref 19–32)
CREATININE: 1.03 mg/dL (ref 0.50–1.35)
Chloride: 105 mmol/L (ref 96–112)
GFR calc Af Amer: >90 mL/min (ref >90)
GFR calc non Af Amer: 85 mL/min — ABNORMAL LOW (ref >90)
GLUCOSE: 99 mg/dL (ref 70–99)
Potassium: 3.7 mmol/L (ref 3.5–5.1)
Sodium: 139 mmol/L (ref 135–145)

## 2014-07-03 LAB — LIPID PANEL
CHOL/HDL RATIO: 3.8 ratio
Cholesterol: 192 mg/dL (ref 0–200)
HDL: 51 mg/dL (ref >39)
LDL Cholesterol: 111 mg/dL — ABNORMAL HIGH (ref 0–99)
Triglycerides: 149 mg/dL (ref <150)
VLDL: 30 mg/dL (ref 0–40)

## 2014-07-03 LAB — HEPARIN LEVEL (UNFRACTIONATED)
HEPARIN UNFRACTIONATED: 0.62 [IU]/mL (ref 0.30–0.70)
Heparin Unfractionated: 0.83 IU/mL — ABNORMAL HIGH (ref 0.30–0.70)

## 2014-07-03 MED ORDER — RIVAROXABAN 15 MG PO TABS
15.0000 mg | ORAL_TABLET | Freq: Two times a day (BID) | ORAL | Status: DC
  Administered 2014-07-03 – 2014-07-04 (×2): 15 mg via ORAL
  Filled 2014-07-03 (×2): qty 1

## 2014-07-03 MED ORDER — HYDROCODONE-ACETAMINOPHEN 5-325 MG PO TABS
1.0000 | ORAL_TABLET | ORAL | Status: DC | PRN
  Administered 2014-07-03 – 2014-07-04 (×6): 2 via ORAL
  Filled 2014-07-03 (×6): qty 2

## 2014-07-03 MED ORDER — FENTANYL CITRATE 0.05 MG/ML IJ SOLN
12.5000 ug | INTRAMUSCULAR | Status: DC | PRN
  Administered 2014-07-03: 12.5 ug via INTRAVENOUS

## 2014-07-03 NOTE — Progress Notes (Signed)
     SUBJECTIVE: Still with mild SOB and chest pain with inspiration.   BP 113/80 mmHg  Pulse 64  Temp(Src) 97.7 F (36.5 C) (Oral)  Resp 16  Ht 5\' 11"  (1.803 m)  Wt 173 lb 3.2 oz (78.563 kg)  BMI 24.17 kg/m2  SpO2 100%  Intake/Output Summary (Last 24 hours) at 07/03/14 3299 Last data filed at 07/03/14 0600  Gross per 24 hour  Intake    623 ml  Output    300 ml  Net    323 ml    PHYSICAL EXAM General: Well developed, well nourished, in no acute distress. Alert and oriented x 3.  Psych:  Good affect, responds appropriately Neck: No JVD. No masses noted.  Lungs: Clear bilaterally with no wheezes or rhonci noted.  Heart: RRR with no murmurs noted. Abdomen: Bowel sounds are present. Soft, non-tender.  Extremities: No lower extremity edema.   LABS: Basic Metabolic Panel:  Recent Labs  07/02/14 0259 07/03/14 0130  NA 136 139  K 3.5 3.7  CL 104 105  CO2 25 28  GLUCOSE 91 99  BUN 10 7  CREATININE 0.95 1.03  CALCIUM 9.2 9.0   CBC:  Recent Labs  07/02/14 0259 07/03/14 0130  WBC 6.7 5.2  HGB 14.2 14.2  HCT 42.2 42.5  MCV 93.6 93.8  PLT 275 288   Cardiac Enzymes:  Recent Labs  07/02/14 0551 07/02/14 0840 07/02/14 1320  TROPONINI <0.03 <0.03 <0.03   Fasting Lipid Panel:  Recent Labs  07/03/14 0130  CHOL 192  HDL 51  LDLCALC 111*  TRIG 149  CHOLHDL 3.8    Current Meds: . colchicine  0.6 mg Oral BID  . ibuprofen  400 mg Oral TID  . pantoprazole  40 mg Oral Q0600   Principal Problem:   Atypical chest pain Active Problems:   RBBB (right bundle branch block with left anterior fascicular block)   Tobacco use disorder   Drug abuse   ETOH abuse   Hypertension   COPD (chronic obstructive pulmonary disease)   Congenital heart disease  ASSESSMENT AND PLAN:  1. Chest pain: Atypical for angina. Troponin negative. Reported normal coronaries by cath 2010. CTA chest with evidence of PE. The primary team is treating him with heparin. Echo is  pending today to assess LV function. No ischemic evaluation at this time. Will follow up on results on echo.   2. History of congenital heart disease with corrective surgery at age 18:   3. PE: Continue heparin with plans for long term anticoagulation per primary team.      MCALHANY,CHRISTOPHER  2/22/20167:28 AM

## 2014-07-03 NOTE — Care Management Note (Addendum)
    Page 1 of 1   07/04/2014     10:41:00 AM CARE MANAGEMENT NOTE 07/04/2014  Patient:  Jeremy Booker, Jeremy Booker   Account Number:  [id number]  Date Initiated:  07/03/2014  Documentation initiated by:  GRAVES-BIGELOW,Sincere Berlanga  Subjective/Objective Assessment:   Pt admitted for chest pain. Positive for PE. Pt is without insurance and PCP. CM did call to the [facility] for hospital f/u and appointment made for Wed 07-05-14. Pt is aware and placed on AVS. Pharmacy on site at [facility].     Action/Plan:   FC did speak with pt in regards to billing and possible disability. Unsure of po anticoagulant therapy at this time. CM will provide a 30 day free card. Will f/u.   Anticipated DC Date:  07/04/2014   Anticipated DC Plan:  HOME/SELF CARE  In-house referral  Financial Counselor      DC Planning Services  CM consult  Medication [facility]  Follow-up appt scheduled      Choice offered to / List presented to:             Status of service:  Completed, signed off Medicare Important Message given?  NO (If response is "NO", the following Medicare IM given date fields will be blank) Date Medicare IM given:   Medicare IM given by:   Date Additional Medicare IM given:   Additional Medicare IM given by:    Discharge Disposition:  HOME/SELF CARE  Per UR Regulation:  Reviewed for med. necessity/level of care/duration of stay  If discussed at [place] of Stay Meetings, dates discussed:    Comments:  07-04-14 0955 Jacqlyn Krauss, RN,BSN [phone] CM did  provide pt with the 30 day free xarelto card and the patient assistance form. Pt has f/u @ the  [facility]- they can assist with the patient assistance forms. No further needs from CM at this time.

## 2014-07-03 NOTE — Progress Notes (Signed)
ANTICOAGULATION CONSULT NOTE - Follow Up Consult  Pharmacy Consult for heparin Indication: pulmonary embolus   Medications:  Scheduled:  . colchicine  0.6 mg Oral BID  . ibuprofen  400 mg Oral TID  . pantoprazole  40 mg Oral Q0600   Infusions:  . heparin 1,300 Units/hr (07/02/14 2006)    Assessment: 49 yo male with PE is currently therapeutic on heparin.  Heparin level is 0.66 Goal of Therapy:  Heparin level 0.3-0.7 units/ml Monitor platelets by anticoagulation protocol: Yes   Plan:  - Cont heparin at 1300 units/hr. - 6hr heparin level to confirm  Thanks for allowing pharmacy to be a part of this patient's care.  Excell Seltzer, PharmD Clinical Pharmacist, [phone] 07/03/2014,2:44 AM

## 2014-07-03 NOTE — Progress Notes (Signed)
PROGRESS NOTE  Jeremy Booker DPO:[id number] DOB: 07-20-1965 DOA: 07/02/2014 PCP: No PCP Per Patient  Brief history 49 year old male with a history of hypertension, tobacco use, COPD presents with 3 day history of chest discomfort. Initially, his chest discomfort was off and on and dull in nature. However after church on the day of admission, he began having constant left-sided and substernal chest discomfort that was sharp and constant with some shortness of breath. He denied any syncope. Patient states that he has a history of cardiac surgery to fix a "hole in his heart"when he was a child. He denies any fevers, chills, coughing, hemoptysis. He continues to smoke approximately 5 cigarettes per day, but has approximately 30-pack-year history. The patient states the only Dilaudid relieved his pain yesterday. The patient previously used cocaine, last used 3 months ago. Urine drug screen at the time of admission was negative. BMP and CBC were unremarkable. Chest x-ray was negative. Initial troponins are negative. EKG showed right bundle branch block.since hospitalization, the patient continues to complain of persistent sharp chest discomfort.  Assessment/Plan: RLL Pulmonary Embolus -heparin drip started at the time of admission -I discussed the risks, benefits, and alternatives of warfarin versus factor Xa inhibitors-->pt chose rivaroxaban -echo--EF 55-60%, no WMA, normal RV -Discontinue heparin drip--> start rivaroxaban Atypical chest pain -troponins--neg x 3 -Echo--results pending -CTA chest--+RLL PE -appreciate cardiology input -continue ASA Tobacco use -Tobacco cessation discussed RBBB -Echocardiogram--as discussed above History of cocaine use  -repeat urine drug screen negative  Family Communication:   Pt at beside Disposition Plan:   Home 07/04/14 if stable       Procedures/Studies: Ct Angio Chest Pe W/cm &/or Wo Cm  07/02/2014   CLINICAL DATA:  Chest pain and  shortness of breath since 06/29/2014. Chest pressure. Smoker.  EXAM: CT ANGIOGRAPHY CHEST WITH CONTRAST  TECHNIQUE: Multidetector CT imaging of the chest was performed using the standard protocol prior to and during during bolus administration of intravenous contrast. Multiplanar CT image reconstructions and MIPs were obtained to evaluate the vascular anatomy.  CONTRAST:  163mL OMNIPAQUE IOHEXOL 350 MG/ML SOLN  COMPARISON:  Portable chest obtained earlier today.  FINDINGS: There is a filling defect and a right lower lobe pulmonary artery, occluding the distal portion of the artery. On axial image number 76, there is a suggestion of a small filling defect within a left lower lobe pulmonary artery. However, this appears to be artifactual due to 8 be and in the artery, best seen on the sagittal images. No other pulmonary arterial filling defects are seen.  There is a 1.3 x 0.7 cm irregular density in the right upper lobe on image number 33, measuring 1.3 cm in length on sagittal image number 54. Also noted are bullous changes and areas of hyper expansion in both lungs. There is some linear atelectasis or scarring in the right lower lobe and, to a lesser degree, in the anterior portions of the left upper lobe.  No enlarged lymph nodes. The examination was tailored for evaluation of the pulmonary arteries, with an adequate opacification of the aorta to evaluate for dissection. Mild thoracic spine degenerative changes and mild scoliosis. Median sternotomy wires. Unremarkable upper abdomen.  Review of the MIP images confirms the above findings.  IMPRESSION: 1. Right lower lobe pulmonary embolism. 2. 1.3 x 1.3 x 0.7 cm probable irregular scar in the right upper lobe. A small lung carcinoma cannot be excluded. Therefore, a followup chest CT is  recommended in 6 months. 3. COPD. Critical Value/emergent results were called by telephone at the time of interpretation on 07/02/2014 at 11:42 am to [place], the patient's nurse, who  verbally acknowledged these results. He stated that he would inform Dr. Carles Collet.   Electronically Signed   By: Claudie Revering M.D.   On: 07/02/2014 11:52   Dg Chest Port 1 View  07/02/2014   CLINICAL DATA:  Left-sided chest pain, duration 3 days.  EXAM: PORTABLE CHEST - 1 VIEW  COMPARISON:  03/18/2010  FINDINGS: A single AP portable view of the chest demonstrates no focal airspace consolidation or alveolar edema. The lungs are grossly clear. There is no large effusion or pneumothorax. There is unchanged cardiomegaly. Cardiac and mediastinal contours are otherwise unremarkable.  IMPRESSION: No active disease.   Electronically Signed   By: [name] M.D.   On: 07/02/2014 03:43         Subjective: Patient denies fevers, chills, headache,  dyspnea, nausea, vomiting, diarrhea, abdominal pain, dysuria, hematuria. He still has occasional chest discomfort but it is better than it was at the time of admission.   Objective: Filed Vitals:   07/02/14 1500 07/02/14 2100 07/03/14 0500 07/03/14 1300  BP: 140/89 129/74 113/80 114/60  Pulse: 66 65 64 65  Temp: 97.9 F (36.6 C) 97.7 F (36.5 C) 97.7 F (36.5 C) 98.8 F (37.1 C)  TempSrc: Oral   Oral  Resp: 18 18 16 20   Height:      Weight:      SpO2: 98% 100% 100% 99%    Intake/Output Summary (Last 24 hours) at 07/03/14 1407 Last data filed at 07/03/14 0931  Gross per 24 hour  Intake    923 ml  Output    200 ml  Net    723 ml   Weight change:  Exam:   General:  Pt is alert, follows commands appropriately, not in acute distress  HEENT: No icterus, No thrush,  [place]/AT  Cardiovascular: RRR, S1/S2, no rubs, no gallops  Respiratory: CTA bilaterally, no wheezing, no crackles, no rhonchi  Abdomen: Soft/+BS, non tender, non distended, no guarding  Extremities: No edema, No lymphangitis, No petechiae, No rashes, no synovitis  Data Reviewed: Basic Metabolic Panel:  Recent Labs Lab 07/02/14 0259 07/03/14 0130  NA 136 139  K 3.5 3.7    CL 104 105  CO2 25 28  GLUCOSE 91 99  BUN 10 7  CREATININE 0.95 1.03  CALCIUM 9.2 9.0   Liver Function Tests: No results for input(s): AST, ALT, ALKPHOS, BILITOT, PROT, ALBUMIN in the last 168 hours. No results for input(s): LIPASE, AMYLASE in the last 168 hours. No results for input(s): AMMONIA in the last 168 hours. CBC:  Recent Labs Lab 07/02/14 0259 07/03/14 0130  WBC 6.7 5.2  HGB 14.2 14.2  HCT 42.2 42.5  MCV 93.6 93.8  PLT 275 288   Cardiac Enzymes:  Recent Labs Lab 07/02/14 0551 07/02/14 0840 07/02/14 1320  TROPONINI <0.03 <0.03 <0.03   BNP: Invalid input(s): POCBNP CBG: No results for input(s): GLUCAP in the last 168 hours.  No results found for this or any previous visit (from the past 240 hour(s)).   Scheduled Meds: . pantoprazole  40 mg Oral Q0600   Continuous Infusions: . heparin 1,150 Units/hr (07/03/14 1006)     Erleen Egner, DO  [facility] Pager [phone]  If 7PM-7AM, please contact night-coverage [email or web address] Password [facility] 07/03/2014, 2:07 PM

## 2014-07-03 NOTE — Progress Notes (Signed)
Echocardiogram 2D Echocardiogram has been performed.  Jeremy Booker 07/03/2014, 12:31 PM

## 2014-07-03 NOTE — Progress Notes (Signed)
UR completed 

## 2014-07-03 NOTE — Progress Notes (Signed)
ANTICOAGULATION CONSULT NOTE - Follow Up Consult  Pharmacy Consult for Heparin Indication: pulmonary embolus  Allergies  Allergen Reactions  . Dilaudid [Hydromorphone Hcl] Itching  . Naprosyn [Naproxen] Rash    Patient has taken ibuprofen and tolerates that just fine    Patient Measurements: Height: 5\' 11"  (180.3 cm) Weight: 173 lb 3.2 oz (78.563 kg) IBW/kg (Calculated) : 75.3 Heparin Dosing Weight: 78 kg  Vital Signs: Temp: 97.7 F (36.5 C) (02/22 0500) BP: 113/80 mmHg (02/22 0500) Pulse Rate: 64 (02/22 0500)  Labs:  Recent Labs  07/02/14 0259 07/02/14 0551 07/02/14 0840 07/02/14 1320 07/02/14 1837 07/03/14 0130  HGB 14.2  --   --   --   --  14.2  HCT 42.2  --   --   --   --  42.5  PLT 275  --   --   --   --  288  HEPARINUNFRC  --   --   --   --  0.71* 0.62  CREATININE 0.95  --   --   --   --  1.03  TROPONINI  --  <0.03 <0.03 <0.03  --   --     Estimated Creatinine Clearance: 94.4 mL/min (by C-G formula based on Cr of 1.03).   Medications:  Heparin @ 1300 units/hr  Assessment: 32 YOM admitted on 2/21 with CP and found via CTA to have new RLL PE (also with hx of prior PE). Heparin level this AM is SUPRAtherapeutic (HL 0.83 << 0.62, goal of 0.3-0.7). CBC wnl. No overt s/sx of bleeding noted.   Goal of Therapy:  Heparin level 0.3-0.7 units/ml Monitor platelets by anticoagulation protocol: Yes   Plan:  1. Reduce heparin drip rate to 1150 units/hr (11.5 ml/hr) 2. Will continue to monitor for any signs/symptoms of bleeding and will follow up with heparin level in 6 hours   Alycia Rossetti, PharmD, BCPS Clinical Pharmacist Pager: [phone] 07/03/2014 8:24 AM

## 2014-07-04 LAB — CBC
HCT: 41.2 % (ref 39.0–52.0)
Hemoglobin: 13.7 g/dL (ref 13.0–17.0)
MCH: 31.4 pg (ref 26.0–34.0)
MCHC: 33.3 g/dL (ref 30.0–36.0)
MCV: 94.3 fL (ref 78.0–100.0)
PLATELETS: 277 10*3/uL (ref 150–400)
RBC: 4.37 MIL/uL (ref 4.22–5.81)
RDW: 13.7 % (ref 11.5–15.5)
WBC: 4.8 10*3/uL (ref 4.0–10.5)

## 2014-07-04 MED ORDER — RIVAROXABAN 15 MG PO TABS
15.0000 mg | ORAL_TABLET | Freq: Two times a day (BID) | ORAL | Status: DC
Start: 2014-07-04 — End: 2014-10-12

## 2014-07-04 MED ORDER — RIVAROXABAN 20 MG PO TABS: 20.0000 mg | ORAL_TABLET | Freq: Every day | ORAL | Status: DC

## 2014-07-04 MED ORDER — HYDROCODONE-ACETAMINOPHEN 5-325 MG PO TABS: 1.0000 | ORAL_TABLET | ORAL | Status: DC | PRN

## 2014-07-04 NOTE — Discharge Instructions (Addendum)
Information on my medicine - XARELTO (rivaroxaban)  This medication education was reviewed with me or my healthcare representative as part of my discharge preparation.  The pharmacist that spoke with me during my hospital stay was:  [employer], Pharm.D.  WHY WAS XARELTO PRESCRIBED FOR YOU? Xarelto was prescribed to treat blood clots that may have been found in the veins of your legs (deep vein thrombosis) or in your lungs (pulmonary embolism) and to reduce the risk of them occurring again.  What do you need to know about Xarelto? The starting dose is one 15 mg tablet taken TWICE daily with food for the FIRST 21 DAYS then on (enter date)  07/25/14  the dose is changed to one 20 mg tablet taken ONCE A DAY with your evening meal.  DO NOT stop taking Xarelto without talking to the health care provider who prescribed the medication.  Refill your prescription for 20 mg tablets before you run out.  After discharge, you should have regular check-up appointments with your healthcare provider that is prescribing your Xarelto.  In the future your dose may need to be changed if your kidney function changes by a significant amount.  What do you do if you miss a dose? If you are taking Xarelto TWICE DAILY and you miss a dose, take it as soon as you remember. You may take two 15 mg tablets (total 30 mg) at the same time then resume your regularly scheduled 15 mg twice daily the next day.  If you are taking Xarelto ONCE DAILY and you miss a dose, take it as soon as you remember on the same day then continue your regularly scheduled once daily regimen the next day. Do not take two doses of Xarelto at the same time.   Important Safety Information Xarelto is a blood thinner medicine that can cause bleeding. You should call your healthcare provider right away if you experience any of the following: ? Bleeding from an injury or your nose that does not stop. ? Unusual colored urine (red or dark brown)  or unusual colored stools (red or black). ? Unusual bruising for unknown reasons. ? A serious fall or if you hit your head (even if there is no bleeding).  Some medicines may interact with Xarelto and might increase your risk of bleeding while on Xarelto. To help avoid this, consult your healthcare provider or pharmacist prior to using any new prescription or non-prescription medications, including herbals, vitamins, non-steroidal anti-inflammatory drugs (NSAIDs) and supplements.  This website has more information on Xarelto: [email or web address]

## 2014-07-04 NOTE — Progress Notes (Signed)
Patient Profile: 49 y/o male with a h/o congenital heart disease s/p surgical correction in his youth, who was admitted for CP. Cardiac enzymes negative x 3. CT of chest positive for RLL PE. Now on Xarelto. 2D echo shows normal EF with mildly sclerotic aortic valve with trace AI and a mildly dilated aortic root;   Subjective:  Feels better. Still with slight dyspnea but improving.  Objective: Vital signs in last 24 hours: Temp:  [98.2 F (36.8 C)-98.8 F (37.1 C)] 98.2 F (36.8 C) (02/23 0500) Pulse Rate:  [51-66] 66 (02/23 0500) Resp:  [16-20] 16 (02/23 0500) BP: (114-138)/(60-85) 125/76 mmHg (02/23 0500) SpO2:  [97 %-99 %] 98 % (02/23 0500) Weight:  [173 lb 3.2 oz (78.563 kg)] 173 lb 3.2 oz (78.563 kg) (02/23 0500) Last BM Date: 07/03/14  Intake/Output from previous day: 02/22 0701 - 02/23 0700 In: 2330 [P.O.:2330] Out: 200 [Urine:200] Intake/Output this shift:    Medications Current Facility-Administered Medications  Medication Dose Route Frequency Provider Last Rate Last Dose  . fentaNYL (SUBLIMAZE) injection 12.5 mcg  12.5 mcg Intravenous Q3H PRN Rhetta Mura Schorr, NP   12.5 mcg at 07/03/14 6144  . HYDROcodone-acetaminophen (NORCO/VICODIN) 5-325 MG per tablet 1-2 tablet  1-2 tablet Oral Q4H PRN Orson Eva, MD   2 tablet at 07/04/14 1038  . nitroGLYCERIN (NITROSTAT) SL tablet 0.4 mg  0.4 mg Sublingual Q5 min PRN Kristen N Ward, DO   0.4 mg at 07/02/14 0316  . ondansetron (ZOFRAN) injection 4 mg  4 mg Intravenous Q6H PRN Etta Quill, DO      . Rivaroxaban (XARELTO) tablet 15 mg  15 mg Oral BID WC Orson Eva, MD   15 mg at 07/04/14 0831    PE: General appearance: alert, cooperative and no distress Neck: no carotid bruit and no JVD Lungs: clear to auscultation bilaterally Heart: regular rate and rhythm, S1, S2 normal, no murmur, click, rub or gallop Extremities: no LEE Pulses: 2+ and symmetric Skin: warm and dry Neurologic: Grossly normal  Lab Results:   Recent  Labs  07/02/14 0259 07/03/14 0130 07/04/14 0338  WBC 6.7 5.2 4.8  HGB 14.2 14.2 13.7  HCT 42.2 42.5 41.2  PLT 275 288 277   BMET  Recent Labs  07/02/14 0259 07/03/14 0130  NA 136 139  K 3.5 3.7  CL 104 105  CO2 25 28  GLUCOSE 91 99  BUN 10 7  CREATININE 0.95 1.03  CALCIUM 9.2 9.0   PT/INR No results for input(s): LABPROT, INR in the last 72 hours. Cholesterol  Recent Labs  07/03/14 0130  CHOL 192   Cardiac Enzymes Invalid input(s): TROPONIN,  CKMB  Studies/Results: 2D echo 07/03/14 Study Conclusions  - Left ventricle: The cavity size was normal. Wall thickness was increased in a pattern of mild LVH. Systolic function was normal. The estimated ejection fraction was in the range of 55% to 60%. Wall motion was normal; there were no regional wall motion abnormalities. Left ventricular diastolic function parameters were normal. - Aortic valve: There was trivial regurgitation. - Aortic root: The aortic root was mildly dilated.  Impressions:  - Normal LV function; mildly sclerotic aortic valve with trace AI; mildly dilated aortic root; suggest CTA to further assess.  Assessment/Plan  Principal Problem:   Atypical chest pain Active Problems:   RBBB (right bundle branch block with left anterior fascicular block)   Tobacco use disorder   Drug abuse   ETOH abuse   Tobacco abuse   Hypertension  COPD (chronic obstructive pulmonary disease)   Congenital heart disease   Pulmonary embolus  1. PE: RLL. On Xarelto. Management per IM  2. Mildly Dilated Aortic Root with Trace AI: noticed on 2D echo. CTA recommended for further assessment. I've reviewed radiology report on CT Angio obtained 07/02/14 to assess for PE. Per report "The examination was tailored for evaluationof the pulmonary arteries, with an adequate opacification of the aorta to evaluate for dissection". There was no mention of root dilatation.  MD to review echo findings and will provide  recs.       LOS: 1 day    Brittainy M. Ladoris Gene 07/04/2014 10:39 AM  I have personally seen and examined this patient with Lyda Jester, PA-C I agree with the assessment and plan as outlined above. Echo is overall normal. No evidence of dilated aortic root on CTA chest. LV function is normal. Chest pain and dyspnea likely due to PE. Continue Xarelto per primary team. No further cardiac workup. No cardiac follow necessary. Will sign off. Please call with questions.   MCALHANY,CHRISTOPHER 07/04/2014 12:18 PM

## 2014-07-05 ENCOUNTER — Ambulatory Visit: Payer: Self-pay | Attending: Internal Medicine | Admitting: Internal Medicine

## 2014-07-05 ENCOUNTER — Encounter: Payer: Self-pay | Admitting: Internal Medicine

## 2014-07-05 VITALS — BP 150/97 | HR 61 | Temp 98.1°F | Resp 15 | Wt 179.0 lb

## 2014-07-05 DIAGNOSIS — J449 Chronic obstructive pulmonary disease, unspecified: Secondary | ICD-10-CM | POA: Insufficient documentation

## 2014-07-05 DIAGNOSIS — J984 Other disorders of lung: Secondary | ICD-10-CM

## 2014-07-05 DIAGNOSIS — Z720 Tobacco use: Secondary | ICD-10-CM

## 2014-07-05 DIAGNOSIS — F1490 Cocaine use, unspecified, uncomplicated: Secondary | ICD-10-CM | POA: Insufficient documentation

## 2014-07-05 DIAGNOSIS — J439 Emphysema, unspecified: Secondary | ICD-10-CM | POA: Insufficient documentation

## 2014-07-05 DIAGNOSIS — F1099 Alcohol use, unspecified with unspecified alcohol-induced disorder: Secondary | ICD-10-CM | POA: Insufficient documentation

## 2014-07-05 DIAGNOSIS — F17210 Nicotine dependence, cigarettes, uncomplicated: Secondary | ICD-10-CM | POA: Insufficient documentation

## 2014-07-05 DIAGNOSIS — G8929 Other chronic pain: Secondary | ICD-10-CM

## 2014-07-05 DIAGNOSIS — F1290 Cannabis use, unspecified, uncomplicated: Secondary | ICD-10-CM | POA: Insufficient documentation

## 2014-07-05 DIAGNOSIS — F17200 Nicotine dependence, unspecified, uncomplicated: Secondary | ICD-10-CM

## 2014-07-05 DIAGNOSIS — J438 Other emphysema: Secondary | ICD-10-CM

## 2014-07-05 DIAGNOSIS — M545 Low back pain: Secondary | ICD-10-CM | POA: Insufficient documentation

## 2014-07-05 DIAGNOSIS — I10 Essential (primary) hypertension: Secondary | ICD-10-CM

## 2014-07-05 DIAGNOSIS — Z7901 Long term (current) use of anticoagulants: Secondary | ICD-10-CM | POA: Insufficient documentation

## 2014-07-05 DIAGNOSIS — Z139 Encounter for screening, unspecified: Secondary | ICD-10-CM

## 2014-07-05 DIAGNOSIS — I2699 Other pulmonary embolism without acute cor pulmonale: Secondary | ICD-10-CM

## 2014-07-05 LAB — TSH: TSH: 0.636 u[IU]/mL (ref 0.350–4.500)

## 2014-07-05 LAB — COMPLETE METABOLIC PANEL WITH GFR
ALK PHOS: 82 U/L (ref 39–117)
ALT: 37 U/L (ref 0–53)
AST: 43 U/L — ABNORMAL HIGH (ref 0–37)
Albumin: 4.5 g/dL (ref 3.5–5.2)
BUN: 10 mg/dL (ref 6–23)
CO2: 27 mEq/L (ref 19–32)
CREATININE: 1.06 mg/dL (ref 0.50–1.35)
Calcium: 9.8 mg/dL (ref 8.4–10.5)
Chloride: 102 mEq/L (ref 96–112)
GFR, EST NON AFRICAN AMERICAN: 83 mL/min
GFR, Est African American: >89 mL/min
Glucose, Bld: 72 mg/dL (ref 70–99)
Potassium: 4.6 mEq/L (ref 3.5–5.3)
SODIUM: 139 meq/L (ref 135–145)
TOTAL PROTEIN: 8.1 g/dL (ref 6.0–8.3)
Total Bilirubin: 0.5 mg/dL (ref 0.2–1.2)

## 2014-07-05 MED ORDER — LISINOPRIL 5 MG PO TABS: 5.0000 mg | ORAL_TABLET | Freq: Every day | ORAL | Status: DC

## 2014-07-05 MED ORDER — ALBUTEROL SULFATE HFA 108 (90 BASE) MCG/ACT IN AERS: 2.0000 | INHALATION_SPRAY | Freq: Four times a day (QID) | RESPIRATORY_TRACT | Status: DC | PRN

## 2014-07-05 MED ORDER — GABAPENTIN 300 MG PO CAPS: 300.0000 mg | ORAL_CAPSULE | Freq: Three times a day (TID) | ORAL | Status: DC

## 2014-07-05 NOTE — Progress Notes (Signed)
Patient here for follow up from hospital Patient has a history of COPD, HTN,DDD Patient still having SOB Complains of feeling dizzy and having discomfort to his lungs

## 2014-07-05 NOTE — Patient Instructions (Signed)
DASH Eating Plan °DASH stands for "Dietary Approaches to Stop Hypertension." The DASH eating plan is a healthy eating plan that has been shown to reduce high blood pressure (hypertension). Additional health benefits may include reducing the risk of type 2 diabetes mellitus, heart disease, and stroke. The DASH eating plan may also help with weight loss. °WHAT DO I NEED TO KNOW ABOUT THE DASH EATING PLAN? °For the DASH eating plan, you will follow these general guidelines: °· Choose foods with a percent daily value for sodium of less than 5% (as listed on the food label). °· Use salt-free seasonings or herbs instead of table salt or sea salt. °· Check with your health care provider or pharmacist before using salt substitutes. °· Eat lower-sodium products, often labeled as "lower sodium" or "no salt added." °· Eat fresh foods. °· Eat more vegetables, fruits, and low-fat dairy products. °· Choose whole grains. Look for the word "whole" as the first word in the ingredient list. °· Choose fish and skinless chicken or turkey more often than red meat. Limit fish, poultry, and meat to 6 oz (170 g) each day. °· Limit sweets, desserts, sugars, and sugary drinks. °· Choose heart-healthy fats. °· Limit cheese to 1 oz (28 g) per day. °· Eat more home-cooked food and less restaurant, buffet, and fast food. °· Limit fried foods. °· Cook foods using methods other than frying. °· Limit canned vegetables. If you do use them, rinse them well to decrease the sodium. °· When eating at a restaurant, ask that your food be prepared with less salt, or no salt if possible. °WHAT FOODS CAN I EAT? °Seek help from a dietitian for individual calorie needs. °Grains °Whole grain or whole wheat bread. Brown rice. Whole grain or whole wheat pasta. Quinoa, bulgur, and whole grain cereals. Low-sodium cereals. Corn or whole wheat flour tortillas. Whole grain cornbread. Whole grain crackers. Low-sodium crackers. °Vegetables °Fresh or frozen vegetables  (raw, steamed, roasted, or grilled). Low-sodium or reduced-sodium tomato and vegetable juices. Low-sodium or reduced-sodium tomato sauce and paste. Low-sodium or reduced-sodium canned vegetables.  °Fruits °All fresh, canned (in natural juice), or frozen fruits. °Meat and Other Protein Products °Ground beef (85% or leaner), grass-fed beef, or beef trimmed of fat. Skinless chicken or turkey. Ground chicken or turkey. Pork trimmed of fat. All fish and seafood. Eggs. Dried beans, peas, or lentils. Unsalted nuts and seeds. Unsalted canned beans. °Dairy °Low-fat dairy products, such as skim or 1% milk, 2% or reduced-fat cheeses, low-fat ricotta or cottage cheese, or plain low-fat yogurt. Low-sodium or reduced-sodium cheeses. °Fats and Oils °Tub margarines without trans fats. Light or reduced-fat mayonnaise and salad dressings (reduced sodium). Avocado. Safflower, olive, or canola oils. Natural peanut or almond butter. °Other °Unsalted popcorn and pretzels. °The items listed above may not be a complete list of recommended foods or beverages. Contact your dietitian for more options. °WHAT FOODS ARE NOT RECOMMENDED? °Grains °White bread. White pasta. White rice. Refined cornbread. Bagels and croissants. Crackers that contain trans fat. °Vegetables °Creamed or fried vegetables. Vegetables in a cheese sauce. Regular canned vegetables. Regular canned tomato sauce and paste. Regular tomato and vegetable juices. °Fruits °Dried fruits. Canned fruit in light or heavy syrup. Fruit juice. °Meat and Other Protein Products °Fatty cuts of meat. Ribs, chicken wings, bacon, sausage, bologna, salami, chitterlings, fatback, hot dogs, bratwurst, and packaged luncheon meats. Salted nuts and seeds. Canned beans with salt. °Dairy °Whole or 2% milk, cream, half-and-half, and cream cheese. Whole-fat or sweetened yogurt. Full-fat   cheeses or blue cheese. Nondairy creamers and whipped toppings. Processed cheese, cheese spreads, or cheese  curds. °Condiments °Onion and garlic salt, seasoned salt, table salt, and sea salt. Canned and packaged gravies. Worcestershire sauce. Tartar sauce. Barbecue sauce. Teriyaki sauce. Soy sauce, including reduced sodium. Steak sauce. Fish sauce. Oyster sauce. Cocktail sauce. Horseradish. Ketchup and mustard. Meat flavorings and tenderizers. Bouillon cubes. Hot sauce. Tabasco sauce. Marinades. Taco seasonings. Relishes. °Fats and Oils °Butter, stick margarine, lard, shortening, ghee, and bacon fat. Coconut, palm kernel, or palm oils. Regular salad dressings. °Other °Pickles and olives. Salted popcorn and pretzels. °The items listed above may not be a complete list of foods and beverages to avoid. Contact your dietitian for more information. °WHERE CAN I FIND MORE INFORMATION? °National [employer], [employer], [employer] Institute: www.nhlbi.nih.gov/health/health-topics/topics/dash/ °Document Released: 04/17/2011 Document Revised: 09/12/2013 Document Reviewed: 03/02/2013 °ExitCare® Patient Information ©2015 [employer]. This information is not intended to replace advice given to you by your health care provider. Make sure you discuss any questions you have with your health care provider. ° °

## 2014-07-05 NOTE — Progress Notes (Signed)
Patient Demographics  Jeremy Booker, is a 49 y.o. male  UGQ:[id number]  UEK:[id number]  DOB - May 06, 1966  CC:  Chief Complaint  Patient presents with  . Hospitalization Follow-up       HPI: Jeremy Booker is a 49 y.o. male here today to establish medical care.patient has history of hypertension, congenital heart disease as per patient he had a cardiac surgery done when he was child, history of COPD, polysubstance abuse, tobacco abuse, recently hospitalized with symptoms of chest pain shortness of breath, EMR reviewed patient had a CT angiogram done reported pulmonary embolism, COPD and lung scarring, patient was started on Xarelto as per patient is taking the medication,as per patient he was told her to continue with this medication at least for 6 months. Patient denies any family history of blood clots, currently denies any more smoking cigarettes after he got discharge, patient also complains of occasional chest pain and chronic lower back pain and is requesting some pain medication as per patient he used to see an eye specialist in the past and was told he has degenerative disc disease. Patient has No headache, No chest pain, No abdominal pain - No Nausea, No new weakness tingling or numbness, No Cough - SOB.  Allergies  Allergen Reactions  . Morphine And Related Shortness Of Breath  . Dilaudid [Hydromorphone Hcl] Itching  . Naprosyn [Naproxen] Rash    Patient has taken ibuprofen and tolerates that just fine   Past Medical History  Diagnosis Date  . COPD (chronic obstructive pulmonary disease)   . Hypertension     a. Dx 6 yrs ago - untreated.  . Tobacco abuse     a. 30 pack years.  Marland Kitchen ETOH abuse     a. 06/2014 currently a few bottles of wine/week.  . Drug abuse     a. 06/2014 Cocaine/Marijuana - last used a few months ago.  . Congenital heart disease     a. thinks he had "a hole in my heart" s/p surgical correction @ age 7 and then age 2.  . Chest pain     a. 2010 s/p cath  in [place], [place] - reportedly nl.   Current Outpatient Prescriptions on File Prior to Visit  Medication Sig Dispense Refill  . HYDROcodone-acetaminophen (NORCO/VICODIN) 5-325 MG per tablet Take 1-2 tablets by mouth every 4 (four) hours as needed for moderate pain. 30 tablet 0  . Rivaroxaban (XARELTO) 15 MG TABS tablet Take 1 tablet (15 mg total) by mouth 2 (two) times daily with a meal. 40 tablet 0  . rivaroxaban (XARELTO) 20 MG TABS tablet Take 1 tablet (20 mg total) by mouth daily with supper. Start AFTER finished with 15mg  pills 30 tablet 4   No current facility-administered medications on file prior to visit.   Family History  Problem Relation Age of Onset  . Hypertension Mother     alive  . Other Father     died in late 26's - ? cause  . Hypertension Maternal Uncle   . Heart disease Maternal Uncle    History   Social History  . Marital Status: Single    Spouse Name: N/A  . Number of Children: N/A  . Years of Education: N/A   Occupational History  . Not on file.   Social History Main Topics  . Smoking status: Current Some Day Smoker -- 30 years  . Smokeless tobacco: Not on file     Comment: ~ 30 pack year hx - currently  smoking 5 cigarettes/wk (06/2014).  . Alcohol Use: 0.0 oz/week    0 Standard drinks or equivalent per week     Comment: Previously drank heavily on a daily basis.  Now drinks a few bottles of wine 1-2 x /wk (06/2014)  . Drug Use: Yes     Comment: uses marijuana and cocaine - last used a few mos ago (06/2014)  . Sexual Activity: Not on file   Other Topics Concern  . Not on file   Social History Narrative   Was living in [place].  Now living with girlfriend in [place].  Unemployed and possibly pending disability.    Review of Systems: Constitutional: Negative for fever, chills, diaphoresis, activity change, appetite change and fatigue. HENT: Negative for ear pain, nosebleeds, congestion, facial swelling, rhinorrhea, neck pain, neck stiffness and ear  discharge.  Eyes: Negative for pain, discharge, redness, itching and visual disturbance. Respiratory: Negative for cough, choking, chest tightness, shortness of breath, wheezing and stridor.  Cardiovascular: Negative for chest pain, palpitations and leg swelling. Gastrointestinal: Negative for abdominal distention. Genitourinary: Negative for dysuria, urgency, frequency, hematuria, flank pain, decreased urine volume, difficulty urinating and dyspareunia.  Musculoskeletal: Negative for back pain, joint swelling, arthralgia and gait problem. Neurological: Negative for dizziness, tremors, seizures, syncope, facial asymmetry, speech difficulty, weakness, light-headedness, numbness and headaches.  Hematological: Negative for adenopathy. Does not bruise/bleed easily. Psychiatric/Behavioral: Negative for hallucinations, behavioral problems, confusion, dysphoric mood, decreased concentration and agitation.    Objective:   Filed Vitals:   07/05/14 1042  BP: 150/97  Pulse: 61  Temp: 98.1 F (36.7 C)  Resp: 15    Physical Exam: Constitutional: Patient appears well-developed and well-nourished. No distress. HENT: Normocephalic, atraumatic, External right and left ear normal. Oropharynx is clear and moist.  Eyes: Conjunctivae and EOM are normal. PERRLA, no scleral icterus. Neck: Normal ROM. Neck supple. No JVD. No tracheal deviation. No thyromegaly. CVS: RRR, S1/S2 +, no murmurs, no gallops, no carotid bruit.  Pulmonary: Effort and breath sounds normal, no stridor, rhonchi, wheezes, rales.  Abdominal: Soft. BS +, no distension, tenderness, rebound or guarding.  Musculoskeletal: Normal range of motion. No edema and no tenderness.  Neuro: Alert. Normal reflexes, muscle tone coordination. No cranial nerve deficit. Skin: Skin is warm and dry. No rash noted. Not diaphoretic. No erythema. No pallor. Psychiatric: Normal mood and affect. Behavior, judgment, thought content normal.  Lab Results    Component Value Date   WBC 4.8 07/04/2014   HGB 13.7 07/04/2014   HCT 41.2 07/04/2014   MCV 94.3 07/04/2014   PLT 277 07/04/2014   Lab Results  Component Value Date   CREATININE 1.03 07/03/2014   BUN 7 07/03/2014   NA 139 07/03/2014   K 3.7 07/03/2014   CL 105 07/03/2014   CO2 28 07/03/2014    No results found for: HGBA1C Lipid Panel     Component Value Date/Time   CHOL 192 07/03/2014 0130   TRIG 149 07/03/2014 0130   HDL 51 07/03/2014 0130   CHOLHDL 3.8 07/03/2014 0130   VLDL 30 07/03/2014 0130   LDLCALC 111* 07/03/2014 0130       Assessment and plan:   1. Pulmonary embolus/2. Other emphysema/5. Apical lung   - Ambulatory referral to Pulmonology - albuterol (PROVENTIL HFA;VENTOLIN HFA) 108 (90 BASE) MCG/ACT inhaler; Inhale 2 puffs into the lungs every 6 (six) hours as needed for wheezing or shortness of breath.  Dispense: 1 Inhaler; Refill: 0  I have started patient on albuterol, patient  has quit smoking, also referred to pulmonology for further evaluation currently patient is on Xarelto.  3. Essential hypertension Advised patient for DASH diet, also started on lisinopril. - lisinopril (PRINIVIL,ZESTRIL) 5 MG tablet; Take 1 tablet (5 mg total) by mouth daily.  Dispense: 90 tablet; Refill: 3 - COMPLETE METABOLIC PANEL WITH GFR  4. Tobacco use disorder As per patient since the discharge from the hospital he has quit smoking.  6. Chronic lower back pain  - DG Lumbar Spine Complete; Future - gabapentin (NEURONTIN) 300 MG capsule; Take 1 capsule (300 mg total) by mouth 3 (three) times daily.  Dispense: 90 capsule; Refill: 3  7. Screening  - Hemoglobin A1c - Vit D  25 hydroxy (rtn osteoporosis monitoring) - TSH      Health Maintenance  -Vaccinations:  Patient declined flu shot, Pneumovax, offered HIV test patient declines.  No Follow-up on file.    The patient was given clear instructions to go to ER or return to medical center if symptoms don't  improve, worsen or new problems develop. The patient verbalized understanding. The patient was told to call to get lab results if they haven't heard anything in the next week.    This note has been created with Surveyor, quantity. Any transcriptional errors are unintentional.   Lorayne Marek, MD

## 2014-07-06 ENCOUNTER — Telehealth: Payer: Self-pay

## 2014-07-06 LAB — HEMOGLOBIN A1C
Hgb A1c MFr Bld: 5.3 % (ref <5.7)
Mean Plasma Glucose: 105 mg/dL (ref <117)

## 2014-07-06 LAB — VITAMIN D 25 HYDROXY (VIT D DEFICIENCY, FRACTURES): Vit D, 25-Hydroxy: 8 ng/mL — ABNORMAL LOW (ref 30–100)

## 2014-07-06 MED ORDER — VITAMIN D (ERGOCALCIFEROL) 1.25 MG (50000 UNIT) PO CAPS: 50000.0000 [IU] | ORAL_CAPSULE | ORAL | Status: DC

## 2014-07-06 NOTE — Discharge Summary (Signed)
Physician Discharge Summary  Jeremy Booker HWE:[id number] DOB: December 23, 1965 DOA: 07/02/2014  PCP: No PCP Per Patient  Admit date: 07/02/2014 Discharge date: 07/04/2014 Recommendations for Outpatient Follow-up:  1. Pt will need to follow up with PCP in 2 weeks post discharge   Discharge Diagnoses:  RLL Pulmonary Embolus -heparin drip started at the time of admission -I discussed the risks, benefits, and alternatives of warfarin versus factor Xa inhibitors-->pt chose rivaroxaban -echo--EF 55-60%, no WMA, normal RV -Discontinue heparin drip--> start rivaroxaban 07/03/14 -pt tolerated Xarelto without complications--continue 15mg  bid x 20 days then 20mg  daily -followup appt set up at [facility] and [place] Atypical chest pain -troponins--neg x 3 -Echo--EF 55-60%, no RV strain, no WMA -CTA chest--+RLL PE, no dissection -appreciate cardiology input -Norco 5/325, #30 given at time of d/c Tobacco use -Tobacco cessation discussed RBBB -Echocardiogram--as discussed above History of cocaine use  -repeat urine drug screen negative  Discharge Condition: stable  Disposition:home  Follow-up Information    Follow up with [facility]     On 07/05/2014.   Why:  Hospital follow up  @ 9:00am. Co pay 20.00   Contact information:   [address] [place] [address] [phone]      Diet:regular Wt Readings from Last 3 Encounters:  07/05/14 81.194 kg (179 lb)  07/04/14 78.563 kg (173 lb 3.2 oz)    History of present illness:  49 year old male with a history of hypertension, tobacco use, COPD presents with 3 day history of chest discomfort. Initially, his chest discomfort was off and on and dull in nature. However after church on the day of admission, he began having constant left-sided and substernal chest discomfort that was sharp and constant with some shortness of breath. He denied any syncope. Patient states that he has  a history of cardiac surgery to fix a "hole in his heart"when he was a child. He denies any fevers, chills, coughing, hemoptysis. He continues to smoke approximately 5 cigarettes per day, but has approximately 30-pack-year history. The patient states the only Dilaudid relieved his pain yesterday. The patient previously used cocaine, last used 3 months ago. Urine drug screen at the time of admission was negative. BMP and CBC were unremarkable. Chest x-ray was negative. Initial troponins are negative. EKG showed right bundle branch block.since hospitalization, the patient continues to complain of persistent sharp chest discomfort.     Consultants: cardiology  Discharge Exam: Filed Vitals:   07/04/14 0500  BP: 125/76  Pulse: 66  Temp: 98.2 F (36.8 C)  Resp: 16   Filed Vitals:   07/03/14 1300 07/03/14 2100 07/03/14 2300 07/04/14 0500  BP: 114/60 138/85 126/69 125/76  Pulse: 65 51  66  Temp: 98.8 F (37.1 C) 98.2 F (36.8 C)  98.2 F (36.8 C)  TempSrc: Oral     Resp: 20 18  16   Height:      Weight:    78.563 kg (173 lb 3.2 oz)  SpO2: 99% 97%  98%   General: A&O x 3, NAD, pleasant, cooperative Cardiovascular: RRR, no rub, no gallop, no S3 Respiratory: CTAB, no wheeze, no rhonchi Abdomen:soft, nontender, nondistended, positive bowel sounds Extremities: No edema, No lymphangitis, no petechiae  Discharge Instructions  Discharge Instructions    Diet - low sodium heart healthy    Complete by:  As directed      Discharge instructions    Complete by:  As directed   Take Xarelto 15mg  two times a  day x 20 days.  On day #21, take Xarelto 20mg  once daily     Increase activity slowly    Complete by:  As directed             Medication List    TAKE these medications        HYDROcodone-acetaminophen 5-325 MG per tablet  Commonly known as:  NORCO/VICODIN  Take 1-2 tablets by mouth every 4 (four) hours as needed for moderate pain.     Rivaroxaban 15 MG Tabs tablet  Commonly  known as:  XARELTO  Take 1 tablet (15 mg total) by mouth 2 (two) times daily with a meal.     rivaroxaban 20 MG Tabs tablet  Commonly known as:  XARELTO  Take 1 tablet (20 mg total) by mouth daily with supper. Start AFTER finished with 15mg  pills  Notes to Patient:  +++ DO NOT TAKE UNTIL YOU HAVE FINISHED TAKING ALL OF THE 15MG  TABLETS +++         The results of significant diagnostics from this hospitalization (including imaging, microbiology, ancillary and laboratory) are listed below for reference.    Significant Diagnostic Studies: Ct Angio Chest Pe W/cm &/or Wo Cm  07/02/2014   CLINICAL DATA:  Chest pain and shortness of breath since 06/29/2014. Chest pressure. Smoker.  EXAM: CT ANGIOGRAPHY CHEST WITH CONTRAST  TECHNIQUE: Multidetector CT imaging of the chest was performed using the standard protocol prior to and during during bolus administration of intravenous contrast. Multiplanar CT image reconstructions and MIPs were obtained to evaluate the vascular anatomy.  CONTRAST:  149mL OMNIPAQUE IOHEXOL 350 MG/ML SOLN  COMPARISON:  Portable chest obtained earlier today.  FINDINGS: There is a filling defect and a right lower lobe pulmonary artery, occluding the distal portion of the artery. On axial image number 76, there is a suggestion of a small filling defect within a left lower lobe pulmonary artery. However, this appears to be artifactual due to 8 be and in the artery, best seen on the sagittal images. No other pulmonary arterial filling defects are seen.  There is a 1.3 x 0.7 cm irregular density in the right upper lobe on image number 33, measuring 1.3 cm in length on sagittal image number 54. Also noted are bullous changes and areas of hyper expansion in both lungs. There is some linear atelectasis or scarring in the right lower lobe and, to a lesser degree, in the anterior portions of the left upper lobe.  No enlarged lymph nodes. The examination was tailored for evaluation of the pulmonary  arteries, with an adequate opacification of the aorta to evaluate for dissection. Mild thoracic spine degenerative changes and mild scoliosis. Median sternotomy wires. Unremarkable upper abdomen.  Review of the MIP images confirms the above findings.  IMPRESSION: 1. Right lower lobe pulmonary embolism. 2. 1.3 x 1.3 x 0.7 cm probable irregular scar in the right upper lobe. A small lung carcinoma cannot be excluded. Therefore, a followup chest CT is recommended in 6 months. 3. COPD. Critical Value/emergent results were called by telephone at the time of interpretation on 07/02/2014 at 11:42 am to Murphy, the patient's nurse, who verbally acknowledged these results. He stated that he would inform Dr. Carles Collet.   Electronically Signed   By: Claudie Revering M.D.   On: 07/02/2014 11:52   Dg Chest Port 1 View  07/02/2014   CLINICAL DATA:  Left-sided chest pain, duration 3 days.  EXAM: PORTABLE CHEST - 1 VIEW  COMPARISON:  03/18/2010  FINDINGS: A single AP portable view of the chest demonstrates no focal airspace consolidation or alveolar edema. The lungs are grossly clear. There is no large effusion or pneumothorax. There is unchanged cardiomegaly. Cardiac and mediastinal contours are otherwise unremarkable.  IMPRESSION: No active disease.   Electronically Signed   By: [name] M.D.   On: 07/02/2014 03:43     Microbiology: No results found for this or any previous visit (from the past 240 hour(s)).   Labs: Basic Metabolic Panel:  Recent Labs Lab 07/02/14 0259 07/03/14 0130 07/05/14 1125  NA 136 139 139  K 3.5 3.7 4.6  CL 104 105 102  CO2 25 28 27   GLUCOSE 91 99 72  BUN 10 7 10   CREATININE 0.95 1.03 1.06  CALCIUM 9.2 9.0 9.8   Liver Function Tests:  Recent Labs Lab 07/05/14 1125  AST 43*  ALT 37  ALKPHOS 82  BILITOT 0.5  PROT 8.1  ALBUMIN 4.5   No results for input(s): LIPASE, AMYLASE in the last 168 hours. No results for input(s): AMMONIA in the last 168 hours. CBC:  Recent Labs Lab  07/02/14 0259 07/03/14 0130 07/04/14 0338  WBC 6.7 5.2 4.8  HGB 14.2 14.2 13.7  HCT 42.2 42.5 41.2  MCV 93.6 93.8 94.3  PLT 275 288 277   Cardiac Enzymes:  Recent Labs Lab 07/02/14 0551 07/02/14 0840 07/02/14 1320  TROPONINI <0.03 <0.03 <0.03   BNP: Invalid input(s): POCBNP CBG: No results for input(s): GLUCAP in the last 168 hours.  Time coordinating discharge:  Greater than 30 minutes  Signed:  Naomii Kreger, DO [facility] Pager: [phone] 07/06/2014, 7:33 PM

## 2014-07-06 NOTE — Telephone Encounter (Signed)
-----   Message from Lorayne Marek, MD sent at 07/06/2014  9:16 AM EST ----- Blood work reviewed, noticed low vitamin D, call patient advise to start ergocalciferol 50,000 units once a week for the duration of  12 weeks.

## 2014-07-06 NOTE — Telephone Encounter (Signed)
Patient is aware of his lab results [facility] D [facility]

## 2014-07-10 ENCOUNTER — Ambulatory Visit (HOSPITAL_COMMUNITY)
Admission: RE | Admit: 2014-07-10 | Discharge: 2014-07-10 | Disposition: A | Discharge status: Home / Self Care | Payer: Self-pay | Source: Ambulatory Visit | Attending: Internal Medicine | Admitting: Internal Medicine

## 2014-07-10 DIAGNOSIS — R200 Anesthesia of skin: Secondary | ICD-10-CM | POA: Insufficient documentation

## 2014-07-10 DIAGNOSIS — M545 Low back pain, unspecified: Secondary | ICD-10-CM

## 2014-07-10 DIAGNOSIS — M4800 Spinal stenosis, site unspecified: Secondary | ICD-10-CM | POA: Insufficient documentation

## 2014-07-10 DIAGNOSIS — G8929 Other chronic pain: Secondary | ICD-10-CM | POA: Insufficient documentation

## 2014-07-11 ENCOUNTER — Telehealth: Payer: Self-pay | Admitting: *Deleted

## 2014-07-11 NOTE — Telephone Encounter (Signed)
-----   Message from Minerva Ends, MD sent at 07/10/2014  1:48 PM EST ----- Disc narrowing in low back No significant change from last x-ray in 12/2011 Continue current treatment plan

## 2014-07-11 NOTE — Telephone Encounter (Signed)
Unable to contact pt. No answer.

## 2014-07-20 ENCOUNTER — Encounter: Payer: Self-pay | Admitting: Internal Medicine

## 2014-07-20 ENCOUNTER — Ambulatory Visit (INDEPENDENT_AMBULATORY_CARE_PROVIDER_SITE_OTHER): Payer: Self-pay | Admitting: Internal Medicine

## 2014-07-20 VITALS — BP 134/86 | HR 70 | Ht 71.0 in | Wt 178.8 lb

## 2014-07-20 DIAGNOSIS — I10 Essential (primary) hypertension: Secondary | ICD-10-CM

## 2014-07-20 DIAGNOSIS — I2699 Other pulmonary embolism without acute cor pulmonale: Secondary | ICD-10-CM

## 2014-07-20 DIAGNOSIS — R911 Solitary pulmonary nodule: Secondary | ICD-10-CM

## 2014-07-20 MED ORDER — CLONIDINE HCL 0.1 MG PO TABS: ORAL_TABLET | ORAL | Status: DC

## 2014-07-20 NOTE — Assessment & Plan Note (Signed)
No evidence at all that his ant L cp, which preceded the PE, has anything to do with the pe which is already being addressed and is not a large clot burden, on the R (opposite his pain) with plans to f/u with Dr Annitta Needs.  I would rec at least 6 months to a year then consider referral back here or to hematology for eval before permanently d/cing it.

## 2014-07-20 NOTE — Progress Notes (Signed)
Subjective:    Patient ID: Jeremy Booker, male    DOB: January 31, 1966       MRN: [id number]  HPI  11 yobm quit smoking 06/2014 ? When last doing well A 2006 with  baseline doe x half block stopped by back pain >  breathing now referred 07/20/2014 by Dr Annitta Needs for eval of cp after pe   Admit date: 07/02/2014 Discharge date: 07/04/2014    Discharge Diagnoses:  RLL Pulmonary Embolus -heparin drip started at the time of admission -I discussed the risks, benefits, and alternatives of warfarin versus factor Xa inhibitors-->pt chose rivaroxaban -echo--EF 55-60%, no WMA, normal RV -Discontinue heparin drip--> start rivaroxaban 07/03/14 -pt tolerated Xarelto without complications--continue 15mg  bid x 20 days then 20mg  daily -followup appt set up at [place] Atypical chest pain -troponins--neg x 3 -Echo--EF 55-60%, no RV strain, no WMA -CTA chest--+RLL PE, no dissection -appreciate cardiology input -Norco 5/325, #30 given at time of d/c   07/20/2014 1st [facility] office visit/ Eloy Fehl  / on ACEi  Chief Complaint  Patient presents with  . Pulmonary Consult    Referred by Dr Retta Diones for eval of PE. Pt states that he has had problems with his breathing "for all my life"- worse in Feb 2016 with dx of "scar tissue in lungs" and PE.  Pt states has "constant CP"-worse when he feels SOB.  He states that he is SOB all of the time- with or without any exertion.   cp is acute on chronic (present for years) ant on the left and worse when lies down but no pleuritic. Assoc with chronic raspy dry day > noct cough   No obvious other patterns in day to day or daytime variabilty or assoc  chest tightness, subjective wheeze overt sinus or hb symptoms. No unusual exp hx or h/o childhood pna/ asthma or knowledge of premature birth.  Sleeping ok without nocturnal  or early am exacerbation  of respiratory  c/o's or need for noct saba. Also denies any obvious fluctuation of  symptoms with weather or environmental changes or other aggravating or alleviating factors except as outlined above   Current Medications, Allergies, Complete Past Medical History, Past Surgical History, Family History, and Social History were reviewed in [employer] record.              Review of Systems  Constitutional: Positive for appetite change. Negative for fever, chills, activity change and unexpected weight change.  HENT: Positive for congestion. Negative for dental problem, postnasal drip, rhinorrhea, sneezing, sore throat, trouble swallowing and voice change.   Eyes: Negative for visual disturbance.  Respiratory: Positive for cough and shortness of breath. Negative for choking.   Cardiovascular: Positive for chest pain. Negative for leg swelling.  Gastrointestinal: Positive for abdominal pain. Negative for nausea and vomiting.  Genitourinary: Negative for difficulty urinating.  Musculoskeletal: Positive for arthralgias.  Skin: Positive for rash.  Psychiatric/Behavioral: Negative for behavioral problems and confusion.       Objective:   Physical Exam  Animated black male ? Belle indifference affect/ classic pseudowheeze heard across the room   Wt Readings from Last 3 Encounters:  07/20/14 178 lb 12.8 oz (81.103 kg)  07/05/14 179 lb (81.194 kg)  07/04/14 173 lb 3.2 oz (78.563 kg)    Vital signs reviewed  HEENT: nl dentition, turbinates, and orophanx. Nl external ear canals without cough reflex   NECK :  without JVD/Nodes/TM/ nl carotid upstrokes bilaterally  LUNGS: no acc muscle use, clear to A and P bilaterally without cough on insp or exp maneuvers   CV:  RRR  no s3 or murmur or increase in P2, no edema   ABD:  soft and nontender with nl excursion in the supine position. No bruits or organomegaly, bowel sounds nl  MS:  warm without deformities, calf tenderness, cyanosis or clubbing  SKIN: warm and dry without lesions    NEURO:   alert, approp, no deficits    I personally reviewed images and agree with radiology impression as follows:  CTa Chest  07/02/14  1. Right lower lobe pulmonary embolism. 2. 1.3 x 1.3 x 0.7 cm probable irregular scar in the right upper lobe. A small lung carcinoma cannot be excluded. Therefore, a followup chest CT is recommended in 6 months. 3. COPD.      Assessment & Plan:

## 2014-07-20 NOTE — Assessment & Plan Note (Signed)
D/c lisinopril 07/20/2014 due to very prominent pseudowheeze > rec clonidine  ACE inhibitors are problematic in  pts with airway complaints because  even experienced pulmonologists can't always distinguish ace effects from copd/asthma.  By themselves they don't actually cause a problem, much like oxygen can't by itself start a fire, but they certainly serve as a powerful catalyst or enhancer for any "fire"  or inflammatory process in the upper airway, be it caused by an ET  tube or more commonly reflux (especially in the obese or pts with known GERD or who are on biphoshonates).   In a case like his with all kinds of non specific complaints it just makes sense to avoid ACEI  entirely  For now   Clonidine best choice here given anxiety and probable chronic narc use with some of his pains nothing more than narc w/d which should be eliminated with use of clonidine

## 2014-07-20 NOTE — Patient Instructions (Signed)
Clonidine 0.1 mg three times daily   Stop lisinopril and your breathing should gradually improve to where it was   Congratulations on not smoking -  It's the most important aspect of your care  You will need a CT chest 09/30/2014 and will call to make sure you get it (placed in tickle file for recall)

## 2014-07-20 NOTE — Assessment & Plan Note (Signed)
Quit smoking 06/2014 so moderate risk CT Chest  07/02/14 > RUL nodule > 09/30/14 repeat placed in tickle file   Discussed in detail all the  indications, usual  risks and alternatives  relative to the benefits with patient who agrees to repeat study 09/30/14  - rx of PE has priority now anyway so no intervention contemplated in the next 3 months

## 2014-07-21 ENCOUNTER — Other Ambulatory Visit: Payer: Self-pay | Admitting: Internal Medicine

## 2014-07-21 DIAGNOSIS — I2699 Other pulmonary embolism without acute cor pulmonale: Secondary | ICD-10-CM

## 2014-07-24 ENCOUNTER — Other Ambulatory Visit: Payer: Self-pay

## 2014-07-24 DIAGNOSIS — J438 Other emphysema: Secondary | ICD-10-CM

## 2014-07-24 MED ORDER — ALBUTEROL SULFATE HFA 108 (90 BASE) MCG/ACT IN AERS: 2.0000 | INHALATION_SPRAY | Freq: Four times a day (QID) | RESPIRATORY_TRACT | Status: DC | PRN

## 2014-07-24 MED ORDER — RIVAROXABAN 20 MG PO TABS: 20.0000 mg | ORAL_TABLET | Freq: Every day | ORAL | Status: DC

## 2014-07-25 ENCOUNTER — Encounter (HOSPITAL_COMMUNITY): Payer: Self-pay | Admitting: Emergency Medicine

## 2014-07-25 ENCOUNTER — Emergency Department (HOSPITAL_COMMUNITY)
Admission: EM | Admit: 2014-07-25 | Discharge: 2014-07-26 | Disposition: A | Discharge status: Home / Self Care | Payer: Self-pay | Attending: Emergency Medicine | Admitting: Emergency Medicine

## 2014-07-25 DIAGNOSIS — Z87891 Personal history of nicotine dependence: Secondary | ICD-10-CM | POA: Insufficient documentation

## 2014-07-25 DIAGNOSIS — G5701 Lesion of sciatic nerve, right lower limb: Secondary | ICD-10-CM

## 2014-07-25 DIAGNOSIS — Z7901 Long term (current) use of anticoagulants: Secondary | ICD-10-CM | POA: Insufficient documentation

## 2014-07-25 DIAGNOSIS — G4701 Insomnia due to medical condition: Secondary | ICD-10-CM | POA: Insufficient documentation

## 2014-07-25 DIAGNOSIS — J449 Chronic obstructive pulmonary disease, unspecified: Secondary | ICD-10-CM | POA: Insufficient documentation

## 2014-07-25 DIAGNOSIS — Q249 Congenital malformation of heart, unspecified: Secondary | ICD-10-CM | POA: Insufficient documentation

## 2014-07-25 DIAGNOSIS — Z9889 Other specified postprocedural states: Secondary | ICD-10-CM | POA: Insufficient documentation

## 2014-07-25 DIAGNOSIS — Z79899 Other long term (current) drug therapy: Secondary | ICD-10-CM | POA: Insufficient documentation

## 2014-07-25 DIAGNOSIS — I10 Essential (primary) hypertension: Secondary | ICD-10-CM | POA: Insufficient documentation

## 2014-07-25 DIAGNOSIS — Z86711 Personal history of pulmonary embolism: Secondary | ICD-10-CM | POA: Insufficient documentation

## 2014-07-25 HISTORY — DX: Other pulmonary embolism without acute cor pulmonale: I26.99

## 2014-07-25 MED ORDER — DIAZEPAM 2 MG PO TABS
2.0000 mg | ORAL_TABLET | Freq: Once | ORAL | Status: AC
  Administered 2014-07-25: 2 mg via ORAL
  Filled 2014-07-25: qty 1

## 2014-07-25 MED ORDER — RIVAROXABAN 15 MG PO TABS
15.0000 mg | ORAL_TABLET | Freq: Once | ORAL | Status: AC
  Administered 2014-07-25: 15 mg via ORAL
  Filled 2014-07-25: qty 1

## 2014-07-25 MED ORDER — OXYCODONE-ACETAMINOPHEN 5-325 MG PO TABS
1.0000 | ORAL_TABLET | Freq: Once | ORAL | Status: AC
  Administered 2014-07-25: 1 via ORAL
  Filled 2014-07-25: qty 1

## 2014-07-25 MED ORDER — DIAZEPAM 2 MG PO TABS: 2.0000 mg | ORAL_TABLET | Freq: Two times a day (BID) | ORAL | Status: DC | PRN

## 2014-07-25 MED ORDER — OXYCODONE-ACETAMINOPHEN 5-325 MG PO TABS: 1.0000 | ORAL_TABLET | Freq: Two times a day (BID) | ORAL | Status: DC | PRN

## 2014-07-25 NOTE — ED Notes (Signed)
Pt c/o back pain for "years" worse in past couple days-- states is unable to put weight on both feet. Hx Pulmonary Emboli last month -- admitted to the hospital.
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
## 2014-07-25 NOTE — Discharge Instructions (Signed)
Sciatica Jeremy Booker, follow-up with neurosurgery for continued evaluation of your sciatica. Take medication as prescribed, this medicine does make you drowsy. If any symptoms worsen come back to emergency department immediately. Thank you. Sciatica is pain, weakness, numbness, or tingling along your sciatic nerve. The nerve starts in the lower back and runs down the back of each leg. Nerve damage or certain conditions pinch or put pressure on the sciatic nerve. This causes the pain, weakness, and other discomforts of sciatica. HOME CARE   Only take medicine as told by your doctor.  Apply ice to the affected area for 20 minutes. Do this 3-4 times a day for the first 48-72 hours. Then try heat in the same way.  Exercise, stretch, or do your usual activities if these do not make your pain worse.  Go to physical therapy as told by your doctor.  Keep all doctor visits as told.  Do not wear high heels or shoes that are not supportive.  Get a firm mattress if your mattress is too soft to lessen pain and discomfort. GET HELP RIGHT AWAY IF:   You cannot control when you poop (bowel movement) or pee (urinate).  You have more weakness in your lower back, lower belly (pelvis), butt (buttocks), or legs.  You have redness or puffiness (swelling) of your back.  You have a burning feeling when you pee.  You have pain that gets worse when you lie down.  You have pain that wakes you from your sleep.  Your pain is worse than past pain.  Your pain lasts longer than 4 weeks.  You are suddenly losing weight without reason. MAKE SURE YOU:   Understand these instructions.  Will watch this condition.  Will get help right away if you are not doing well or get worse. Document Released: 02/05/2008 Document Revised: 10/28/2011 Document Reviewed: 09/07/2011 [facility] Patient Information 2015 [place], [place]. This information is not intended to replace advice given to you by your health care provider.  Make sure you discuss any questions you have with your health care provider.

## 2014-07-31 ENCOUNTER — Encounter (HOSPITAL_COMMUNITY): Payer: Self-pay | Admitting: *Deleted

## 2014-07-31 ENCOUNTER — Emergency Department (HOSPITAL_COMMUNITY)
Admission: EM | Admit: 2014-07-31 | Discharge: 2014-07-31 | Disposition: A | Discharge status: Home / Self Care | Payer: Self-pay | Attending: Emergency Medicine | Admitting: Emergency Medicine

## 2014-07-31 DIAGNOSIS — I10 Essential (primary) hypertension: Secondary | ICD-10-CM | POA: Insufficient documentation

## 2014-07-31 DIAGNOSIS — Z8774 Personal history of (corrected) congenital malformations of heart and circulatory system: Secondary | ICD-10-CM | POA: Insufficient documentation

## 2014-07-31 DIAGNOSIS — Z79899 Other long term (current) drug therapy: Secondary | ICD-10-CM | POA: Insufficient documentation

## 2014-07-31 DIAGNOSIS — M545 Low back pain, unspecified: Secondary | ICD-10-CM

## 2014-07-31 DIAGNOSIS — Z86711 Personal history of pulmonary embolism: Secondary | ICD-10-CM | POA: Insufficient documentation

## 2014-07-31 DIAGNOSIS — J449 Chronic obstructive pulmonary disease, unspecified: Secondary | ICD-10-CM | POA: Insufficient documentation

## 2014-07-31 DIAGNOSIS — Z87891 Personal history of nicotine dependence: Secondary | ICD-10-CM | POA: Insufficient documentation

## 2014-07-31 DIAGNOSIS — G8929 Other chronic pain: Secondary | ICD-10-CM | POA: Insufficient documentation

## 2014-07-31 MED ORDER — DEXAMETHASONE SODIUM PHOSPHATE 10 MG/ML IJ SOLN
10.0000 mg | Freq: Once | INTRAMUSCULAR | Status: AC
  Administered 2014-07-31: 10 mg via INTRAMUSCULAR

## 2014-07-31 MED ORDER — HYDROMORPHONE HCL 1 MG/ML IJ SOLN
1.0000 mg | Freq: Once | INTRAMUSCULAR | Status: AC
  Administered 2014-07-31: 1 mg via INTRAMUSCULAR
  Filled 2014-07-31: qty 1

## 2014-07-31 MED ORDER — DIAZEPAM 5 MG PO TABS
5.0000 mg | ORAL_TABLET | Freq: Once | ORAL | Status: AC
  Administered 2014-07-31: 5 mg via ORAL
  Filled 2014-07-31: qty 1

## 2014-07-31 MED ORDER — TRAMADOL HCL 50 MG PO TABS: 50.0000 mg | ORAL_TABLET | Freq: Four times a day (QID) | ORAL | Status: DC | PRN

## 2014-07-31 MED ORDER — DEXAMETHASONE SODIUM PHOSPHATE 10 MG/ML IJ SOLN
10.0000 mg | Freq: Once | INTRAMUSCULAR | Status: DC
  Filled 2014-07-31: qty 1

## 2014-07-31 MED ORDER — METHOCARBAMOL 500 MG PO TABS: 500.0000 mg | ORAL_TABLET | Freq: Two times a day (BID) | ORAL | Status: DC

## 2014-07-31 MED ORDER — OXYCODONE-ACETAMINOPHEN 5-325 MG PO TABS
1.0000 | ORAL_TABLET | Freq: Once | ORAL | Status: AC
  Administered 2014-07-31: 1 via ORAL
  Filled 2014-07-31: qty 1

## 2014-07-31 NOTE — ED Notes (Signed)
Pt's gait unsteady with the use of a cane, provider informed. Pt reports he uses a walker at home to assist with ambulation .

## 2014-07-31 NOTE — Discharge Instructions (Signed)
[facility] [address] [place] [address] [place], [place] [phone]  [facility] [address] [place], [place] [address] [phone]  [facility] [address], [address] [place] [address] [phone]  [facility] [place] [address] [phone]

## 2014-07-31 NOTE — ED Notes (Signed)
Pt made aware to return if symptoms worsen or if any life threatening symptoms occur.   

## 2014-07-31 NOTE — ED Provider Notes (Signed)
CSN: [id number]     Arrival date & time 07/31/14  1810 History  This chart was scribed for a non-physician practitioner, Noland Fordyce, working with Malvin Johns, MD by Martinique Peace, ED Scribe. The patient was seen in TR11C/TR11C. The patient's care was started at 7:32 PM.     Chief Complaint  Patient presents with  . Back Pain      Patient is a 49 y.o. male presenting with back pain. The history is provided by the patient. No language interpreter was used.  Back Pain HPI Comments: Jeremy Booker is a 49 y.o. male who presents to the Emergency Department complaining of chronic lower back pain. Pain worst on the right than left. Pt states he does have a PCP but has not been able to get in contact with them. Pt further states that he was referred to a back surgeon about current pain but explains he has a pending medicaid disability case so he is currently not able to afford it.  Pain has gradually worsened over last several weeks.  Pain is 10/10, worse with movement and ambulation.  States he uses a cane or a walker at home to help him ambulate.  Denies new falls or injuries. No hx of back surgeries.  No complaints of bladder or bowel incontinence. No saddle anesthesia, numbness or weakness in arms or legs, fever, chills, or any other symptoms besides pain.  Pt states he has been incarcerated and was released 7-8 months ago. History of hypertension.    Past Medical History  Diagnosis Date  . COPD (chronic obstructive pulmonary disease)   . Hypertension     a. Dx 6 yrs ago - untreated.  . Tobacco abuse     a. 30 pack years.  Marland Kitchen ETOH abuse     a. 06/2014 currently a few bottles of wine/week.  . Drug abuse     a. 06/2014 Cocaine/Marijuana - last used a few months ago.  . Congenital heart disease     a. thinks he had "a hole in my heart" s/p surgical correction @ age 75 and then age 8.  . Chest pain     a. 2010 s/p cath in [place], [place] - reportedly nl.  . Pulmonary embolism    Past Surgical  History  Procedure Laterality Date  . Cardiac surgery      congenital    Family History  Problem Relation Age of Onset  . Hypertension Mother     alive  . Other Father     died in late 74's - ? cause  . Hypertension Maternal Uncle   . Heart disease Maternal Uncle    History  Substance Use Topics  . Smoking status: Former Smoker -- 0.25 packs/day for 30 years    Types: Cigarettes    Quit date: 06/12/2014  . Smokeless tobacco: Never Used  . Alcohol Use: No     Comment: Previously drank heavily on a daily basis.  Now drinks a few bottles of wine 1-2 x /wk (06/2014)    Review of Systems  Gastrointestinal: Negative for diarrhea and constipation.  Genitourinary: Negative for urgency, frequency, decreased urine volume and difficulty urinating.  Musculoskeletal: Positive for back pain.  All other systems reviewed and are negative.     Allergies  Morphine and related; Dilaudid; and Naprosyn  Home Medications   Prior to Admission medications   Medication Sig Start Date End Date Taking? Authorizing Provider  albuterol (PROVENTIL HFA;VENTOLIN HFA) 108 (90 BASE) MCG/ACT inhaler  Inhale 2 puffs into the lungs every 6 (six) hours as needed for wheezing or shortness of breath. 07/24/14   Lorayne Marek, MD  cloNIDine (CATAPRES) 0.1 MG tablet One three times daily 07/20/14   Tanda Rockers, MD  diazepam (VALIUM) 2 MG tablet Take 1 tablet (2 mg total) by mouth every 12 (twelve) hours as needed for muscle spasms. 07/25/14   Everlene Balls, MD  gabapentin (NEURONTIN) 300 MG capsule Take 1 capsule (300 mg total) by mouth 3 (three) times daily. 07/05/14   Lorayne Marek, MD  HYDROcodone-acetaminophen (NORCO/VICODIN) 5-325 MG per tablet Take 1-2 tablets by mouth every 4 (four) hours as needed for moderate pain. Patient not taking: Reported on 07/20/2014 07/04/14   Orson Eva, MD  methocarbamol (ROBAXIN) 500 MG tablet Take 1 tablet (500 mg total) by mouth 2 (two) times daily. 07/31/14   Noland Fordyce, PA-C   oxyCODONE-acetaminophen (PERCOCET/ROXICET) 5-325 MG per tablet Take 1 tablet by mouth 2 (two) times daily as needed for severe pain. 07/25/14   Everlene Balls, MD  Rivaroxaban (XARELTO) 15 MG TABS tablet Take 1 tablet (15 mg total) by mouth 2 (two) times daily with a meal. 07/04/14   Orson Eva, MD  rivaroxaban (XARELTO) 20 MG TABS tablet Take 1 tablet (20 mg total) by mouth daily with supper. Start AFTER finished with 15mg  pills Patient not taking: Reported on 07/25/2014 07/24/14   Lorayne Marek, MD  traMADol (ULTRAM) 50 MG tablet Take 1 tablet (50 mg total) by mouth every 6 (six) hours as needed. 07/31/14   Noland Fordyce, PA-C  Vitamin D, Ergocalciferol, (DRISDOL) 50000 UNITS CAPS capsule Take 1 capsule (50,000 Units total) by mouth every 7 (seven) days. 07/06/14   Lorayne Marek, MD   BP 127/70 mmHg  Pulse 67  Temp(Src) 98.4 F (36.9 C) (Oral)  Resp 18  Ht 5\' 11"  (1.803 m)  Wt 179 lb (81.194 kg)  BMI 24.98 kg/m2  SpO2 99% Physical Exam  Constitutional: He is oriented to person, place, and time. He appears well-developed and well-nourished.  HENT:  Head: Normocephalic and atraumatic.  Eyes: EOM are normal.  Neck: Normal range of motion.  Cardiovascular: Normal rate.   Pulses:      Dorsalis pedis pulses are 2+ on the right side, and 2+ on the left side.  Pulmonary/Chest: Effort normal.  Musculoskeletal: Normal range of motion. He exhibits tenderness.  Back- Tenderness along right lumbar muscles. No midline spinal tenderness. Full ROM of UE's. Increased pain with flexion of the hip bilaterally. Increased pain with right straight leg raise. Antalgic gait, uses cane for assistance.   Neurological: He is alert and oriented to person, place, and time. No sensory deficit.  Skin: Skin is warm and dry.  Psychiatric: He has a normal mood and affect. His behavior is normal.  Nursing note and vitals reviewed.   ED Course  Procedures (including critical care time) Labs Review Labs Reviewed - No data  to display  Imaging Review No results found.   EKG Interpretation None     Medications  diazepam (VALIUM) tablet 5 mg (5 mg Oral Given 07/31/14 2000)  oxyCODONE-acetaminophen (PERCOCET/ROXICET) 5-325 MG per tablet 1 tablet (1 tablet Oral Given 07/31/14 2000)  HYDROmorphone (DILAUDID) injection 1 mg (1 mg Intramuscular Given 07/31/14 2057)  dexamethasone (DECADRON) injection 10 mg (10 mg Intramuscular Given 07/31/14 2055)    7:38 PM- Treatment plan was discussed with patient who verbalizes understanding and agrees.   MDM   Final diagnoses:  Chronic lower back  pain    Pt is a 49yo male with hx of chronic back pain presenting to ED for second time in 2 weeks for worsening chronic back pain.  Denies new falls or injuries. Denies fever, n/v/d. No change in bowel or bladder habits. No saddle anesthesia.  Not concerned for cauda equina at this time.  Pt given pain medication in ED which did help his gait minimally as pt still had to ambulate with cane due to severe pain.  Discussed pt with Dr. Tamera Punt, although pt has exacerbation of his chronic back pain, emergent MRI is not indicated at this time as pt has no new neurologic symptoms and no recent hx of trauma.  Will discharge pt home with resources for pain management. Advised to use walk-in clinic at [facility] as pt states he has not been able to get a hold of his PCP on the phone. Home care instructions provided. Return precautions provided. Pt verbalized understanding and agreement with tx plan.   I personally performed the services described in this documentation, which was scribed in my presence. The recorded information has been reviewed and is accurate.    Noland Fordyce, PA-C 07/31/14 2141  Malvin Johns, MD 08/01/14 0005

## 2014-07-31 NOTE — ED Notes (Signed)
Pt reports lower back pain. Pt states his sciatica is acting up and is not getting any relief at home.

## 2014-08-14 ENCOUNTER — Encounter: Payer: Self-pay | Admitting: Internal Medicine

## 2014-08-14 ENCOUNTER — Ambulatory Visit: Payer: Self-pay | Attending: Internal Medicine | Admitting: Internal Medicine

## 2014-08-14 VITALS — BP 144/83 | HR 70 | Temp 98.0°F | Resp 16 | Wt 181.4 lb

## 2014-08-14 DIAGNOSIS — M545 Low back pain: Secondary | ICD-10-CM | POA: Insufficient documentation

## 2014-08-14 DIAGNOSIS — F1010 Alcohol abuse, uncomplicated: Secondary | ICD-10-CM | POA: Insufficient documentation

## 2014-08-14 DIAGNOSIS — I10 Essential (primary) hypertension: Secondary | ICD-10-CM | POA: Insufficient documentation

## 2014-08-14 DIAGNOSIS — F1410 Cocaine abuse, uncomplicated: Secondary | ICD-10-CM | POA: Insufficient documentation

## 2014-08-14 DIAGNOSIS — F1210 Cannabis abuse, uncomplicated: Secondary | ICD-10-CM | POA: Insufficient documentation

## 2014-08-14 DIAGNOSIS — G8929 Other chronic pain: Secondary | ICD-10-CM | POA: Insufficient documentation

## 2014-08-14 DIAGNOSIS — I2699 Other pulmonary embolism without acute cor pulmonale: Secondary | ICD-10-CM

## 2014-08-14 DIAGNOSIS — J449 Chronic obstructive pulmonary disease, unspecified: Secondary | ICD-10-CM | POA: Insufficient documentation

## 2014-08-14 DIAGNOSIS — M4806 Spinal stenosis, lumbar region: Secondary | ICD-10-CM | POA: Insufficient documentation

## 2014-08-14 DIAGNOSIS — M9979 Connective tissue and disc stenosis of intervertebral foramina of abdomen and other regions: Secondary | ICD-10-CM

## 2014-08-14 DIAGNOSIS — Z87891 Personal history of nicotine dependence: Secondary | ICD-10-CM | POA: Insufficient documentation

## 2014-08-14 DIAGNOSIS — Z86711 Personal history of pulmonary embolism: Secondary | ICD-10-CM | POA: Insufficient documentation

## 2014-08-14 DIAGNOSIS — Z7951 Long term (current) use of inhaled steroids: Secondary | ICD-10-CM | POA: Insufficient documentation

## 2014-08-14 DIAGNOSIS — Z7901 Long term (current) use of anticoagulants: Secondary | ICD-10-CM | POA: Insufficient documentation

## 2014-08-14 DIAGNOSIS — J438 Other emphysema: Secondary | ICD-10-CM | POA: Insufficient documentation

## 2014-08-14 MED ORDER — RIVAROXABAN 20 MG PO TABS: 20.0000 mg | ORAL_TABLET | Freq: Every day | ORAL | Status: DC

## 2014-08-14 MED ORDER — ALBUTEROL SULFATE HFA 108 (90 BASE) MCG/ACT IN AERS: 2.0000 | INHALATION_SPRAY | Freq: Four times a day (QID) | RESPIRATORY_TRACT | Status: DC | PRN

## 2014-08-14 NOTE — Progress Notes (Signed)
MRN: [id number] Name: Jeremy Booker  Sex: male Age: 49 y.o. DOB: 05-05-1966  Allergies: Morphine and related; Dilaudid; and Naprosyn  Chief Complaint  Patient presents with  . Follow-up    HPI: Patient is 49 y.o. male who history of pulmonary embolism, COPD, chronic low back pain recently went to the emergency room with worsening lower back pain, EMR reviewed patient was given pain medication and was discharged, patient had x-ray done in February which reported to have disc space narrowing at L4/5 with no acute findings, patient denies any recent fall or trauma, is requesting referral to see a specialist, patient also has seen a pulmonologist today is requesting refill on Xarelto and albuterol.  Past Medical History  Diagnosis Date  . COPD (chronic obstructive pulmonary disease)   . Hypertension     a. Dx 6 yrs ago - untreated.  . Tobacco abuse     a. 30 pack years.  Marland Kitchen ETOH abuse     a. 06/2014 currently a few bottles of wine/week.  . Drug abuse     a. 06/2014 Cocaine/Marijuana - last used a few months ago.  . Congenital heart disease     a. thinks he had "a hole in my heart" s/p surgical correction @ age 49 and then age 73.  . Chest pain     a. 2010 s/p cath in [place], [place] - reportedly nl.  . Pulmonary embolism     Past Surgical History  Procedure Laterality Date  . Cardiac surgery      congenital       Medication List       This list is accurate as of: 08/14/14  1:07 PM.  Always use your most recent med list.               albuterol 108 (90 BASE) MCG/ACT inhaler  Commonly known as:  PROVENTIL HFA;VENTOLIN HFA  Inhale 2 puffs into the lungs every 6 (six) hours as needed for wheezing or shortness of breath.     cloNIDine 0.1 MG tablet  Commonly known as:  CATAPRES  One three times daily     diazepam 2 MG tablet  Commonly known as:  VALIUM  Take 1 tablet (2 mg total) by mouth every 12 (twelve) hours as needed for muscle spasms.     gabapentin 300 MG  capsule  Commonly known as:  NEURONTIN  Take 1 capsule (300 mg total) by mouth 3 (three) times daily.     HYDROcodone-acetaminophen 5-325 MG per tablet  Commonly known as:  NORCO/VICODIN  Take 1-2 tablets by mouth every 4 (four) hours as needed for moderate pain.     methocarbamol 500 MG tablet  Commonly known as:  ROBAXIN  Take 1 tablet (500 mg total) by mouth 2 (two) times daily.     oxyCODONE-acetaminophen 5-325 MG per tablet  Commonly known as:  PERCOCET/ROXICET  Take 1 tablet by mouth 2 (two) times daily as needed for severe pain.     Rivaroxaban 15 MG Tabs tablet  Commonly known as:  XARELTO  Take 1 tablet (15 mg total) by mouth 2 (two) times daily with a meal.     rivaroxaban 20 MG Tabs tablet  Commonly known as:  XARELTO  Take 1 tablet (20 mg total) by mouth daily with supper. Start AFTER finished with 15mg  pills     traMADol 50 MG tablet  Commonly known as:  ULTRAM  Take 1 tablet (50 mg total) by mouth every  6 (six) hours as needed.     Vitamin D (Ergocalciferol) 50000 UNITS Caps capsule  Commonly known as:  DRISDOL  Take 1 capsule (50,000 Units total) by mouth every 7 (seven) days.        Meds ordered this encounter  Medications  . rivaroxaban (XARELTO) 20 MG TABS tablet    Sig: Take 1 tablet (20 mg total) by mouth daily with supper. Start AFTER finished with 15mg  pills    Dispense:  90 tablet    Refill:  3  . albuterol (PROVENTIL HFA;VENTOLIN HFA) 108 (90 BASE) MCG/ACT inhaler    Sig: Inhale 2 puffs into the lungs every 6 (six) hours as needed for wheezing or shortness of breath.    Dispense:  3 Inhaler    Refill:  3     There is no immunization history on file for this patient.  Family History  Problem Relation Age of Onset  . Hypertension Mother     alive  . Other Father     died in late 62's - ? cause  . Hypertension Maternal Uncle   . Heart disease Maternal Uncle     History  Substance Use Topics  . Smoking status: Former Smoker -- 0.25  packs/day for 30 years    Types: Cigarettes    Quit date: 06/12/2014  . Smokeless tobacco: Never Used  . Alcohol Use: No     Comment: Previously drank heavily on a daily basis.  Now drinks a few bottles of wine 1-2 x /wk (06/2014)    Review of Systems   As noted in HPI  Filed Vitals:   08/14/14 1218  BP: 144/83  Pulse: 70  Temp: 98 F (36.7 C)  Resp: 16    Physical Exam  Physical Exam  Constitutional: No distress.  Eyes: EOM are normal. Pupils are equal, round, and reactive to light.  Cardiovascular: Normal rate and regular rhythm.   Pulmonary/Chest: Breath sounds normal. No respiratory distress. He has no wheezes. He has no rales.  Musculoskeletal:  Lower lumbar right paraspinal tenderness, equal strength both lower extremities.    CBC    Component Value Date/Time   WBC 4.8 07/04/2014 0338   RBC 4.37 07/04/2014 0338   HGB 13.7 07/04/2014 0338   HCT 41.2 07/04/2014 0338   PLT 277 07/04/2014 0338   MCV 94.3 07/04/2014 0338   LYMPHSABS 2.1 03/18/2010 1358   MONOABS 0.9 03/18/2010 1358   EOSABS 0.1 03/18/2010 1358   BASOSABS 0.1 03/18/2010 1358    CMP     Component Value Date/Time   NA 139 07/05/2014 1125   K 4.6 07/05/2014 1125   CL 102 07/05/2014 1125   CO2 27 07/05/2014 1125   GLUCOSE 72 07/05/2014 1125   BUN 10 07/05/2014 1125   CREATININE 1.06 07/05/2014 1125   CREATININE 1.03 07/03/2014 0130   CALCIUM 9.8 07/05/2014 1125   PROT 8.1 07/05/2014 1125   ALBUMIN 4.5 07/05/2014 1125   AST 43* 07/05/2014 1125   ALT 37 07/05/2014 1125   ALKPHOS 82 07/05/2014 1125   BILITOT 0.5 07/05/2014 1125   GFRNONAA 83 07/05/2014 1125   GFRNONAA 85* 07/03/2014 0130   GFRAA >89 07/05/2014 1125   GFRAA >90 07/03/2014 0130    Lab Results  Component Value Date/Time   CHOL 192 07/03/2014 01:30 AM    No components found for: HGA1C  Lab Results  Component Value Date/Time   AST 43* 07/05/2014 11:25 AM    Assessment and Plan  Other  emphysema - Plan: albuterol  (PROVENTIL HFA;VENTOLIN HFA) 108 (90 BASE) MCG/ACT inhaler  Chronic lower back pain - Plan: pain medications when necessary ,AMB referral to orthopedics, Ambulatory referral to Pain Clinic  Pulmonary embolus - Plan: rivaroxaban (XARELTO) 20 MG TABS tablet  Narrowing of intervertebral disc space - Plan: AMB referral to orthopedics    Return in about 3 months (around 11/13/2014), or if symptoms worsen or fail to improve.   This note has been created with Surveyor, quantity. Any transcriptional errors are unintentional.    Lorayne Marek, MD

## 2014-08-14 NOTE — Progress Notes (Signed)
Patient here for follow up on his back pain Requesting referral to pain management and neuro surgeon Has been having some chest pain and was seen in the ED recently for that Was told it could be caused by the clots in his lungs-stated ekg was normal Patient did not receive his xarelto 20mg  so had been taking double of his 15mg  xarelto and  Started not to feel well

## 2014-09-11 ENCOUNTER — Ambulatory Visit: Payer: Self-pay

## 2014-09-20 ENCOUNTER — Ambulatory Visit: Payer: Self-pay | Attending: Internal Medicine | Admitting: Internal Medicine

## 2014-09-20 ENCOUNTER — Encounter: Payer: Self-pay | Admitting: Internal Medicine

## 2014-09-20 VITALS — BP 132/74 | HR 76 | Temp 98.0°F | Resp 15

## 2014-09-20 DIAGNOSIS — I10 Essential (primary) hypertension: Secondary | ICD-10-CM

## 2014-09-20 DIAGNOSIS — I2699 Other pulmonary embolism without acute cor pulmonale: Secondary | ICD-10-CM | POA: Insufficient documentation

## 2014-09-20 DIAGNOSIS — J438 Other emphysema: Secondary | ICD-10-CM | POA: Insufficient documentation

## 2014-09-20 DIAGNOSIS — M545 Low back pain, unspecified: Secondary | ICD-10-CM

## 2014-09-20 DIAGNOSIS — G8929 Other chronic pain: Secondary | ICD-10-CM | POA: Insufficient documentation

## 2014-09-20 DIAGNOSIS — R21 Rash and other nonspecific skin eruption: Secondary | ICD-10-CM

## 2014-09-20 DIAGNOSIS — M9979 Connective tissue and disc stenosis of intervertebral foramina of abdomen and other regions: Secondary | ICD-10-CM | POA: Insufficient documentation

## 2014-09-20 MED ORDER — METHOCARBAMOL 500 MG PO TABS: 500.0000 mg | ORAL_TABLET | Freq: Two times a day (BID) | ORAL | Status: DC

## 2014-09-20 MED ORDER — HYDROCORTISONE 1 % EX CREA: 1.0000 "application " | TOPICAL_CREAM | Freq: Two times a day (BID) | CUTANEOUS | Status: DC

## 2014-09-20 MED ORDER — GABAPENTIN 400 MG PO CAPS: 400.0000 mg | ORAL_CAPSULE | Freq: Three times a day (TID) | ORAL | Status: DC

## 2014-09-20 NOTE — Patient Instructions (Signed)
DASH Eating Plan °DASH stands for "Dietary Approaches to Stop Hypertension." The DASH eating plan is a healthy eating plan that has been shown to reduce high blood pressure (hypertension). Additional health benefits may include reducing the risk of type 2 diabetes mellitus, heart disease, and stroke. The DASH eating plan may also help with weight loss. °WHAT DO I NEED TO KNOW ABOUT THE DASH EATING PLAN? °For the DASH eating plan, you will follow these general guidelines: °· Choose foods with a percent daily value for sodium of less than 5% (as listed on the food label). °· Use salt-free seasonings or herbs instead of table salt or sea salt. °· Check with your health care provider or pharmacist before using salt substitutes. °· Eat lower-sodium products, often labeled as "lower sodium" or "no salt added." °· Eat fresh foods. °· Eat more vegetables, fruits, and low-fat dairy products. °· Choose whole grains. Look for the word "whole" as the first word in the ingredient list. °· Choose fish and skinless chicken or turkey more often than red meat. Limit fish, poultry, and meat to 6 oz (170 g) each day. °· Limit sweets, desserts, sugars, and sugary drinks. °· Choose heart-healthy fats. °· Limit cheese to 1 oz (28 g) per day. °· Eat more home-cooked food and less restaurant, buffet, and fast food. °· Limit fried foods. °· Cook foods using methods other than frying. °· Limit canned vegetables. If you do use them, rinse them well to decrease the sodium. °· When eating at a restaurant, ask that your food be prepared with less salt, or no salt if possible. °WHAT FOODS CAN I EAT? °Seek help from a dietitian for individual calorie needs. °Grains °Whole grain or whole wheat bread. Brown rice. Whole grain or whole wheat pasta. Quinoa, bulgur, and whole grain cereals. Low-sodium cereals. Corn or whole wheat flour tortillas. Whole grain cornbread. Whole grain crackers. Low-sodium crackers. °Vegetables °Fresh or frozen vegetables  (raw, steamed, roasted, or grilled). Low-sodium or reduced-sodium tomato and vegetable juices. Low-sodium or reduced-sodium tomato sauce and paste. Low-sodium or reduced-sodium canned vegetables.  °Fruits °All fresh, canned (in natural juice), or frozen fruits. °Meat and Other Protein Products °Ground beef (85% or leaner), grass-fed beef, or beef trimmed of fat. Skinless chicken or turkey. Ground chicken or turkey. Pork trimmed of fat. All fish and seafood. Eggs. Dried beans, peas, or lentils. Unsalted nuts and seeds. Unsalted canned beans. °Dairy °Low-fat dairy products, such as skim or 1% milk, 2% or reduced-fat cheeses, low-fat ricotta or cottage cheese, or plain low-fat yogurt. Low-sodium or reduced-sodium cheeses. °Fats and Oils °Tub margarines without trans fats. Light or reduced-fat mayonnaise and salad dressings (reduced sodium). Avocado. Safflower, olive, or canola oils. Natural peanut or almond butter. °Other °Unsalted popcorn and pretzels. °The items listed above may not be a complete list of recommended foods or beverages. Contact your dietitian for more options. °WHAT FOODS ARE NOT RECOMMENDED? °Grains °White bread. White pasta. White rice. Refined cornbread. Bagels and croissants. Crackers that contain trans fat. °Vegetables °Creamed or fried vegetables. Vegetables in a cheese sauce. Regular canned vegetables. Regular canned tomato sauce and paste. Regular tomato and vegetable juices. °Fruits °Dried fruits. Canned fruit in light or heavy syrup. Fruit juice. °Meat and Other Protein Products °Fatty cuts of meat. Ribs, chicken wings, bacon, sausage, bologna, salami, chitterlings, fatback, hot dogs, bratwurst, and packaged luncheon meats. Salted nuts and seeds. Canned beans with salt. °Dairy °Whole or 2% milk, cream, half-and-half, and cream cheese. Whole-fat or sweetened yogurt. Full-fat   cheeses or blue cheese. Nondairy creamers and whipped toppings. Processed cheese, cheese spreads, or cheese  curds. °Condiments °Onion and garlic salt, seasoned salt, table salt, and sea salt. Canned and packaged gravies. Worcestershire sauce. Tartar sauce. Barbecue sauce. Teriyaki sauce. Soy sauce, including reduced sodium. Steak sauce. Fish sauce. Oyster sauce. Cocktail sauce. Horseradish. Ketchup and mustard. Meat flavorings and tenderizers. Bouillon cubes. Hot sauce. Tabasco sauce. Marinades. Taco seasonings. Relishes. °Fats and Oils °Butter, stick margarine, lard, shortening, ghee, and bacon fat. Coconut, palm kernel, or palm oils. Regular salad dressings. °Other °Pickles and olives. Salted popcorn and pretzels. °The items listed above may not be a complete list of foods and beverages to avoid. Contact your dietitian for more information. °WHERE CAN I FIND MORE INFORMATION? °National [employer], [employer], [employer] Institute: www.nhlbi.nih.gov/health/health-topics/topics/dash/ °Document Released: 04/17/2011 Document Revised: 09/12/2013 Document Reviewed: 03/02/2013 °ExitCare® Patient Information ©2015 [employer]. This information is not intended to replace advice given to you by your health care provider. Make sure you discuss any questions you have with your health care provider. ° °

## 2014-09-20 NOTE — Progress Notes (Signed)
MRN: [id number] Name: Jeremy Booker  Sex: male Age: 49 y.o. DOB: 09/07/1965  Allergies: Morphine and related; Dilaudid; and Naprosyn  Chief Complaint  Patient presents with  . Follow-up    HPI: Patient is 49 y.o. male who has history of COPD, pulmonary bowel is a, currently following up with pulmonologist and has scheduled followup CT scan ordered, patient ports chronic lower back pain, as per patient in the past he had MRI done and recently in the he had x-ray done which reported some disc space narrowing at L4 and 5, patient does report worsening of the back pain and leg pain and has difficulty in walking, denies any incontinence, Patient is also complaining of having itchy rash on his neck denies any change in soap detergent any New foods or medications.  Past Medical History  Diagnosis Date  . COPD (chronic obstructive pulmonary disease)   . Hypertension     a. Dx 6 yrs ago - untreated.  . Tobacco abuse     a. 30 pack years.  Marland Kitchen ETOH abuse     a. 06/2014 currently a few bottles of wine/week.  . Drug abuse     a. 06/2014 Cocaine/Marijuana - last used a few months ago.  . Congenital heart disease     a. thinks he had "a hole in my heart" s/p surgical correction @ age 11 and then age 49.  . Chest pain     a. 2010 s/p cath in [place], [place] - reportedly nl.  . Pulmonary embolism     Past Surgical History  Procedure Laterality Date  . Cardiac surgery      congenital       Medication List       This list is accurate as of: 09/20/14  1:05 PM.  Always use your most recent med list.               albuterol 108 (90 BASE) MCG/ACT inhaler  Commonly known as:  PROVENTIL HFA;VENTOLIN HFA  Inhale 2 puffs into the lungs every 6 (six) hours as needed for wheezing or shortness of breath.     cloNIDine 0.1 MG tablet  Commonly known as:  CATAPRES  One three times daily     diazepam 2 MG tablet  Commonly known as:  VALIUM  Take 1 tablet (2 mg total) by mouth every 12  (twelve) hours as needed for muscle spasms.     gabapentin 400 MG capsule  Commonly known as:  NEURONTIN  Take 1 capsule (400 mg total) by mouth 3 (three) times daily.     HYDROcodone-acetaminophen 5-325 MG per tablet  Commonly known as:  NORCO/VICODIN  Take 1-2 tablets by mouth every 4 (four) hours as needed for moderate pain.     hydrocortisone cream 1 %  Apply 1 application topically 2 (two) times daily.     methocarbamol 500 MG tablet  Commonly known as:  ROBAXIN  Take 1 tablet (500 mg total) by mouth 2 (two) times daily.     oxyCODONE-acetaminophen 5-325 MG per tablet  Commonly known as:  PERCOCET/ROXICET  Take 1 tablet by mouth 2 (two) times daily as needed for severe pain.     Rivaroxaban 15 MG Tabs tablet  Commonly known as:  XARELTO  Take 1 tablet (15 mg total) by mouth 2 (two) times daily with a meal.     rivaroxaban 20 MG Tabs tablet  Commonly known as:  XARELTO  Take 1 tablet (20 mg total)  by mouth daily with supper. Start AFTER finished with 15mg  pills     traMADol 50 MG tablet  Commonly known as:  ULTRAM  Take 1 tablet (50 mg total) by mouth every 6 (six) hours as needed.     Vitamin D (Ergocalciferol) 50000 UNITS Caps capsule  Commonly known as:  DRISDOL  Take 1 capsule (50,000 Units total) by mouth every 7 (seven) days.        Meds ordered this encounter  Medications  . gabapentin (NEURONTIN) 400 MG capsule    Sig: Take 1 capsule (400 mg total) by mouth 3 (three) times daily.    Dispense:  90 capsule    Refill:  3  . hydrocortisone cream 1 %    Sig: Apply 1 application topically 2 (two) times daily.    Dispense:  30 g    Refill:  1     There is no immunization history on file for this patient.  Family History  Problem Relation Age of Onset  . Hypertension Mother     alive  . Other Father     died in late 26's - ? cause  . Hypertension Maternal Uncle   . Heart disease Maternal Uncle     History  Substance Use Topics  . Smoking status:  Former Smoker -- 0.25 packs/day for 30 years    Types: Cigarettes    Quit date: 06/12/2014  . Smokeless tobacco: Never Used  . Alcohol Use: No     Comment: Previously drank heavily on a daily basis.  Now drinks a few bottles of wine 1-2 x /wk (06/2014)    Review of Systems   As noted in HPI  Filed Vitals:   09/20/14 1203  BP: 132/74  Pulse: 76  Temp: 98 F (36.7 C)  Resp: 15    Physical Exam  Physical Exam  Constitutional:  Patient is complaining of back pain  Cardiovascular: Normal rate and regular rhythm.   Pulmonary/Chest: Breath sounds normal. No respiratory distress. He has no wheezes. He has no rales.  Musculoskeletal:  Lower lumbar paraspinal tenderness, patient cannot perform SLR test due to the pain.  Skin:  Rash noticed on the neck    CBC    Component Value Date/Time   WBC 4.8 07/04/2014 0338   RBC 4.37 07/04/2014 0338   HGB 13.7 07/04/2014 0338   HCT 41.2 07/04/2014 0338   PLT 277 07/04/2014 0338   MCV 94.3 07/04/2014 0338   LYMPHSABS 2.1 03/18/2010 1358   MONOABS 0.9 03/18/2010 1358   EOSABS 0.1 03/18/2010 1358   BASOSABS 0.1 03/18/2010 1358    CMP     Component Value Date/Time   NA 139 07/05/2014 1125   K 4.6 07/05/2014 1125   CL 102 07/05/2014 1125   CO2 27 07/05/2014 1125   GLUCOSE 72 07/05/2014 1125   BUN 10 07/05/2014 1125   CREATININE 1.06 07/05/2014 1125   CREATININE 1.03 07/03/2014 0130   CALCIUM 9.8 07/05/2014 1125   PROT 8.1 07/05/2014 1125   ALBUMIN 4.5 07/05/2014 1125   AST 43* 07/05/2014 1125   ALT 37 07/05/2014 1125   ALKPHOS 82 07/05/2014 1125   BILITOT 0.5 07/05/2014 1125   GFRNONAA 83 07/05/2014 1125   GFRNONAA 85* 07/03/2014 0130   GFRAA >89 07/05/2014 1125   GFRAA >90 07/03/2014 0130    Lab Results  Component Value Date/Time   CHOL 192 07/03/2014 01:30 AM    Lab Results  Component Value Date/Time   HGBA1C 5.3  07/05/2014 11:25 AM    Lab Results  Component Value Date/Time   AST 43* 07/05/2014 11:25 AM     Assessment and Plan  Essential hypertension Blood pressure is borderline elevated, advise patient for DASH diet continue with current meds  Pulmonary embolus/COPD  Currently patient is on Xarelto., albuterol when necessary following up with pulmonology.  Chronic lower back pain - Plan: MR Lumbar Spine Wo Contrast, Increase the dose of Neurontin gabapentin (NEURONTIN) 400 MG capsule  Rash and nonspecific skin eruption - Plan: hydrocortisone cream 1 %  Return in about 3 months (around 12/21/2014), or if symptoms worsen or fail to improve.   This note has been created with Surveyor, quantity. Any transcriptional errors are unintentional.    Lorayne Marek, MD

## 2014-09-20 NOTE — Progress Notes (Signed)
Patient here for follow up on his lung and breathing issues Patient also has some paperwork that needs to be filled out for disability

## 2014-09-20 NOTE — Addendum Note (Signed)
Addended by: Dorothe Pea on: 09/20/2014 05:06 PM   Modules accepted: Orders

## 2014-10-02 ENCOUNTER — Ambulatory Visit (INDEPENDENT_AMBULATORY_CARE_PROVIDER_SITE_OTHER)
Admission: RE | Admit: 2014-10-02 | Discharge: 2014-10-02 | Disposition: A | Discharge status: Home / Self Care | Payer: Self-pay | Source: Ambulatory Visit | Attending: Internal Medicine | Admitting: Internal Medicine

## 2014-10-02 DIAGNOSIS — I2699 Other pulmonary embolism without acute cor pulmonale: Secondary | ICD-10-CM

## 2014-10-02 MED ORDER — IOHEXOL 350 MG/ML SOLN
80.0000 mL | Freq: Once | INTRAVENOUS | Status: AC | PRN
  Administered 2014-10-02: 80 mL via INTRAVENOUS

## 2014-10-03 NOTE — Progress Notes (Signed)
Quick Note:  Spoke with pt and notified of results per Dr. Melvyn Novas. Pt verbalized understanding and denied any questions. Pt states breathing still not back to normal baseline  I have scheduled ov with Dr Melvyn Novas for 10/12/14  He c/o shaking in his right arm- I advised him to call PCP for appt for eval of this and he verbalized understanding ______

## 2014-10-04 ENCOUNTER — Ambulatory Visit (HOSPITAL_COMMUNITY)
Admission: RE | Admit: 2014-10-04 | Discharge: 2014-10-04 | Disposition: A | Discharge status: Home / Self Care | Payer: Self-pay | Source: Ambulatory Visit | Attending: Internal Medicine | Admitting: Internal Medicine

## 2014-10-04 ENCOUNTER — Encounter (INDEPENDENT_AMBULATORY_CARE_PROVIDER_SITE_OTHER): Payer: Self-pay

## 2014-10-04 DIAGNOSIS — M79606 Pain in leg, unspecified: Secondary | ICD-10-CM | POA: Insufficient documentation

## 2014-10-04 DIAGNOSIS — G8929 Other chronic pain: Secondary | ICD-10-CM

## 2014-10-04 DIAGNOSIS — M5126 Other intervertebral disc displacement, lumbar region: Secondary | ICD-10-CM | POA: Insufficient documentation

## 2014-10-04 DIAGNOSIS — M5136 Other intervertebral disc degeneration, lumbar region: Secondary | ICD-10-CM | POA: Insufficient documentation

## 2014-10-04 DIAGNOSIS — M545 Low back pain: Secondary | ICD-10-CM

## 2014-10-10 ENCOUNTER — Telehealth: Payer: Self-pay | Admitting: Internal Medicine

## 2014-10-10 NOTE — Telephone Encounter (Signed)
Patient called requesting to speak to nurse, please f/u with patient

## 2014-10-12 ENCOUNTER — Emergency Department (HOSPITAL_COMMUNITY): Payer: Self-pay

## 2014-10-12 ENCOUNTER — Ambulatory Visit (INDEPENDENT_AMBULATORY_CARE_PROVIDER_SITE_OTHER): Payer: Self-pay | Admitting: Internal Medicine

## 2014-10-12 ENCOUNTER — Encounter: Payer: Self-pay | Admitting: Internal Medicine

## 2014-10-12 ENCOUNTER — Emergency Department (HOSPITAL_COMMUNITY)
Admission: EM | Admit: 2014-10-12 | Discharge: 2014-10-12 | Disposition: A | Discharge status: Home / Self Care | Payer: Self-pay | Attending: Emergency Medicine | Admitting: Emergency Medicine

## 2014-10-12 ENCOUNTER — Encounter (HOSPITAL_COMMUNITY): Payer: Self-pay | Admitting: *Deleted

## 2014-10-12 VITALS — BP 100/62 | HR 66 | Ht 71.0 in | Wt 181.0 lb

## 2014-10-12 DIAGNOSIS — Z79899 Other long term (current) drug therapy: Secondary | ICD-10-CM | POA: Insufficient documentation

## 2014-10-12 DIAGNOSIS — J441 Chronic obstructive pulmonary disease with (acute) exacerbation: Secondary | ICD-10-CM | POA: Insufficient documentation

## 2014-10-12 DIAGNOSIS — Z9889 Other specified postprocedural states: Secondary | ICD-10-CM | POA: Insufficient documentation

## 2014-10-12 DIAGNOSIS — I2699 Other pulmonary embolism without acute cor pulmonale: Secondary | ICD-10-CM

## 2014-10-12 DIAGNOSIS — I10 Essential (primary) hypertension: Secondary | ICD-10-CM | POA: Insufficient documentation

## 2014-10-12 DIAGNOSIS — Z8774 Personal history of (corrected) congenital malformations of heart and circulatory system: Secondary | ICD-10-CM | POA: Insufficient documentation

## 2014-10-12 DIAGNOSIS — Z7901 Long term (current) use of anticoagulants: Secondary | ICD-10-CM | POA: Insufficient documentation

## 2014-10-12 DIAGNOSIS — R911 Solitary pulmonary nodule: Secondary | ICD-10-CM

## 2014-10-12 DIAGNOSIS — R918 Other nonspecific abnormal finding of lung field: Secondary | ICD-10-CM

## 2014-10-12 DIAGNOSIS — F419 Anxiety disorder, unspecified: Secondary | ICD-10-CM | POA: Insufficient documentation

## 2014-10-12 DIAGNOSIS — M549 Dorsalgia, unspecified: Secondary | ICD-10-CM | POA: Insufficient documentation

## 2014-10-12 DIAGNOSIS — Z86711 Personal history of pulmonary embolism: Secondary | ICD-10-CM | POA: Insufficient documentation

## 2014-10-12 DIAGNOSIS — Z7952 Long term (current) use of systemic steroids: Secondary | ICD-10-CM | POA: Insufficient documentation

## 2014-10-12 DIAGNOSIS — R0789 Other chest pain: Secondary | ICD-10-CM | POA: Insufficient documentation

## 2014-10-12 DIAGNOSIS — Z87891 Personal history of nicotine dependence: Secondary | ICD-10-CM | POA: Insufficient documentation

## 2014-10-12 LAB — BASIC METABOLIC PANEL
Anion gap: 12 (ref 5–15)
BUN: 10 mg/dL (ref 6–20)
CO2: 23 mmol/L (ref 22–32)
CREATININE: 1.23 mg/dL (ref 0.61–1.24)
Calcium: 9.1 mg/dL (ref 8.9–10.3)
Chloride: 103 mmol/L (ref 101–111)
GFR calc Af Amer: >60 mL/min (ref >60)
Glucose, Bld: 83 mg/dL (ref 65–99)
POTASSIUM: 3.7 mmol/L (ref 3.5–5.1)
Sodium: 138 mmol/L (ref 135–145)

## 2014-10-12 LAB — I-STAT TROPONIN, ED
TROPONIN I, POC: 0 ng/mL (ref 0.00–0.08)
Troponin i, poc: 0.01 ng/mL (ref 0.00–0.08)

## 2014-10-12 LAB — CBC
HCT: 42.3 % (ref 39.0–52.0)
HEMOGLOBIN: 14.6 g/dL (ref 13.0–17.0)
MCH: 31.3 pg (ref 26.0–34.0)
MCHC: 34.5 g/dL (ref 30.0–36.0)
MCV: 90.8 fL (ref 78.0–100.0)
PLATELETS: 302 10*3/uL (ref 150–400)
RBC: 4.66 MIL/uL (ref 4.22–5.81)
RDW: 14.1 % (ref 11.5–15.5)
WBC: 7.3 10*3/uL (ref 4.0–10.5)

## 2014-10-12 LAB — BRAIN NATRIURETIC PEPTIDE: B NATRIURETIC PEPTIDE 5: 12.1 pg/mL (ref 0.0–100.0)

## 2014-10-12 MED ORDER — SODIUM CHLORIDE 0.9 % IV BOLUS (SEPSIS)
1000.0000 mL | Freq: Once | INTRAVENOUS | Status: AC
  Administered 2014-10-12: 1000 mL via INTRAVENOUS

## 2014-10-12 MED ORDER — LORAZEPAM 1 MG PO TABS
1.0000 mg | ORAL_TABLET | Freq: Once | ORAL | Status: AC
  Administered 2014-10-12: 1 mg via ORAL
  Filled 2014-10-12: qty 1

## 2014-10-12 NOTE — ED Notes (Signed)
Patient is resting comfortably. 

## 2014-10-12 NOTE — Patient Instructions (Signed)
Dr Annitta Needs will need to look to look into your previous records and consider sending you to a pain clinic but there is no evidence that  Your pain on the L chest wall has anything at all to do with the blood clots on the right  or the nodule or need to referred back to [facility] where your original surgery was done   Return here for cxr in 4 weeks  - if pain noted in other areas of chest would repeat the CT angiogram in meantime  Please see patient coordinator before you leave today  to schedule venous dopplers

## 2014-10-12 NOTE — Discharge Instructions (Signed)

## 2014-10-12 NOTE — ED Notes (Signed)
Dr Yelverton at bedside.  

## 2014-10-12 NOTE — Progress Notes (Signed)
Subjective:    Patient ID: Jeremy Booker, male    DOB: 03-15-1966       MRN: [id number]    Brief patient profile:  48 yobm quit smoking 06/2014 ? When last doing well  2006 with  baseline doe x half block stopped by back pain >  breathing now referred 07/20/2014 by Dr Annitta Needs for eval of cp after pe   Admit date: 07/02/2014 Discharge date: 07/04/2014    Discharge Diagnoses:  RLL Pulmonary Embolus -heparin drip started at the time of admission -I discussed the risks, benefits, and alternatives of warfarin versus factor Xa inhibitors-->pt chose rivaroxaban -echo--EF 55-60%, no WMA, normal RV -Discontinue heparin drip--> start rivaroxaban 07/03/14 -pt tolerated Xarelto without complications--continue 15mg  bid x 20 days then 20mg  daily -followup appt set up at [place] Atypical chest pain -troponins--neg x 3 -Echo--EF 55-60%, no RV strain, no WMA -CTA chest--+RLL PE, no dissection -appreciate cardiology input -Norco 5/325, #30 given at time of d/c   07/20/2014 1st [place] [facility] office visit/ Jeremy Booker  / on ACEi  Chief Complaint  Patient presents with  . Pulmonary Consult    Referred by Dr Retta Diones for eval of PE. Pt states that he has had problems with his breathing "for all my life"- worse in Feb 2016 with dx of "scar tissue in lungs" and PE.  Pt states has "constant CP"-worse when he feels SOB.  He states that he is SOB all of the time- with or without any exertion.   cp is acute on chronic (present for years) ant on the left and worse when lies down but not pleuritic. Assoc with chronic raspy dry day > noct cough  rec Clonidine 0.1 mg three times daily  Stop lisinopril and your breathing should gradually improve back  to where it was  Congratulations on not smoking -  It's the most important aspect of your care       10/12/2014 f/u ov/Jeremy Booker re: new pattern sob/ old pattern cp  Chief Complaint  Patient presents with  . Follow-up    Pt c/o  increased SOB and CP- "for a while"- went to ED this am for these symptoms.     Onset was insidious sob not necessarily related to ex/ "pleuritic" cp on the L ever since PE was dx'd   No obvious day to day or daytime variabilty or assoc chronic cough or cp or chest tightness, subjective wheeze overt sinus or hb symptoms. No unusual exp hx or h/o childhood pna/ asthma or knowledge of premature birth.  Sleeping ok without nocturnal  or early am exacerbation  of respiratory  c/o's or need for noct saba. Also denies any obvious fluctuation of symptoms with weather or environmental changes or other aggravating or alleviating factors except as outlined above   Current Medications, Allergies, Complete Past Medical History, Past Surgical History, Family History, and Social History were reviewed in [employer] record.  ROS  The following are not active complaints unless bolded sore throat, dysphagia, dental problems, itching, sneezing,  nasal congestion or excess/ purulent secretions, ear ache,   fever, chills, sweats, unintended wt loss, pleuritic or exertional cp, hemoptysis,  orthopnea pnd or leg swelling, presyncope, palpitations, heartburn, abdominal pain, anorexia, nausea, vomiting, diarrhea  or change in bowel or urinary habits, change in stools or urine, dysuria,hematuria,  rash, arthralgias, visual complaints, headache, numbness weakness or ataxia or problems with walking or coordination,  change in mood/affect or memory.  Objective:   Physical Exam  amb  black male very unusual affect/ no longer any audible  pseudowheeze  But freq grunting now unrelated to breathing/ grabbing L pect muscle  10/12/14             181  Wt Readings from Last 3 Encounters:  07/20/14 178 lb 12.8 oz (81.103 kg)  07/05/14 179 lb (81.194 kg)  07/04/14 173 lb 3.2 oz (78.563 kg)    Vital signs reviewed  HEENT: nl dentition, turbinates, and orophanx. Nl external ear canals  without cough reflex   NECK :  without JVD/Nodes/TM/ nl carotid upstrokes bilaterally   LUNGS: no acc muscle use, clear to A and P bilaterally without cough on insp or exp maneuvers   CV:  RRR  no s3 or murmur or increase in P2, no edema   ABD:  soft and nontender with nl excursion in the supine position. No bruits or organomegaly, bowel sounds nl  MS:  warm without deformities, calf tenderness, cyanosis or clubbing  SKIN: warm and dry without lesions    NEURO:  alert, approp, no deficits    I personally reviewed images and agree with radiology impression as follows:  CTa Chest 10/02/14 No new foci of pulmonary embolus. Currently there is a focal area of pulmonary embolus involving a portion of the superior segment of the right lower lobe pulmonary artery. Suspect chronic change given prior studies.  Underlying areas of parenchymal lung scarring, stable. Nodular opacities in the right upper lobe remain. Largest nodular opacity measures 1.2 x 0.7 cm. No new opacities are identified. No frank airspace consolidation. No adenopathy.  10/12/14 cxr Vascular congestion noted. Patchy bilateral airspace opacities may reflect multifocal pneumonia or pulmonary edema. My review: not as deep a breath on today's film as comparison cxr from 07/02/14       Assessment & Plan:

## 2014-10-12 NOTE — ED Notes (Signed)
Pt c/o chest pain to left chest x 1 hour. Also c/o SOB.

## 2014-10-12 NOTE — ED Provider Notes (Signed)
CSN: [id number]     Arrival date & time 10/12/14  0126 History  This chart was scribed for Julianne Rice, MD by Chester Holstein, ED Scribe. This patient was seen in room A01C/A01C and the patient's care was started at 2:10 AM.     Chief Complaint  Patient presents with  . Chest Pain     The history is provided by the patient. No language interpreter was used.   HPI Comments: Jeremy Booker is a 49 y.o. male with PMHx of COPD, HTN, congenital heart disease, and PE who presents to the Emergency Department complaining of waxing and waning sharp chest pain with intermittent feeling of heaviness with onset for several months. Pain is worsened in intensity over the last hour. Pt reports chronic SOB and chest pain for 4-5 months. Pt reports deep inspiration aggravates the pain. Pt is currently on Xarelto. Pt reports recent PMHx of PE and tumor. Pt denies h/o MI. He has not taken any medication for relief. Pt has a scar from childhood heart surgery. Pt denies leg swelling.   Past Medical History  Diagnosis Date  . COPD (chronic obstructive pulmonary disease)   . Hypertension     a. Dx 6 yrs ago - untreated.  . Tobacco abuse     a. 30 pack years.  Marland Kitchen ETOH abuse     a. 06/2014 currently a few bottles of wine/week.  . Drug abuse     a. 06/2014 Cocaine/Marijuana - last used a few months ago.  . Congenital heart disease     a. thinks he had "a hole in my heart" s/p surgical correction @ age 74 and then age 50.  . Chest pain     a. 2010 s/p cath in [place], [place] - reportedly nl.  . Pulmonary embolism    Past Surgical History  Procedure Laterality Date  . Cardiac surgery      congenital    Family History  Problem Relation Age of Onset  . Hypertension Mother     alive  . Other Father     died in late 52's - ? cause  . Hypertension Maternal Uncle   . Heart disease Maternal Uncle    History  Substance Use Topics  . Smoking status: Former Smoker -- 0.25 packs/day for 30 years    Types:  Cigarettes    Quit date: 06/12/2014  . Smokeless tobacco: Never Used  . Alcohol Use: No     Comment: Previously drank heavily on a daily basis.  Now drinks a few bottles of wine 1-2 x /wk (06/2014)    Review of Systems  Constitutional: Negative for fever and chills.  Respiratory: Positive for shortness of breath.   Cardiovascular: Positive for chest pain. Negative for leg swelling.  Gastrointestinal: Negative for nausea, vomiting, abdominal pain and diarrhea.  Musculoskeletal: Positive for back pain (baseline). Negative for neck pain and neck stiffness.  Skin: Negative for rash and wound.  Neurological: Negative for dizziness, weakness, light-headedness, numbness and headaches.  All other systems reviewed and are negative.     Allergies  Morphine and related; Dilaudid; and Naprosyn  Home Medications   Prior to Admission medications   Medication Sig Start Date End Date Taking? Authorizing Provider  albuterol (PROVENTIL HFA;VENTOLIN HFA) 108 (90 BASE) MCG/ACT inhaler Inhale 2 puffs into the lungs every 6 (six) hours as needed for wheezing or shortness of breath. 08/14/14  Yes Lorayne Marek, MD  cloNIDine (CATAPRES) 0.1 MG tablet One three times daily 07/20/14  Yes Tanda Rockers, MD  diazepam (VALIUM) 2 MG tablet Take 1 tablet (2 mg total) by mouth every 12 (twelve) hours as needed for muscle spasms. 07/25/14  Yes Everlene Balls, MD  gabapentin (NEURONTIN) 400 MG capsule Take 1 capsule (400 mg total) by mouth 3 (three) times daily. 09/20/14  Yes Lorayne Marek, MD  hydrocortisone cream 1 % Apply 1 application topically 2 (two) times daily. 09/20/14  Yes Lorayne Marek, MD  methocarbamol (ROBAXIN) 500 MG tablet Take 1 tablet (500 mg total) by mouth 2 (two) times daily. 09/20/14  Yes Lorayne Marek, MD  rivaroxaban (XARELTO) 20 MG TABS tablet Take 1 tablet (20 mg total) by mouth daily with supper. Start AFTER finished with 15mg  pills 08/14/14  Yes Lorayne Marek, MD  traMADol (ULTRAM) 50 MG tablet Take  1 tablet (50 mg total) by mouth every 6 (six) hours as needed. 07/31/14  Yes Noland Fordyce, PA-C  HYDROcodone-acetaminophen (NORCO/VICODIN) 5-325 MG per tablet Take 1-2 tablets by mouth every 4 (four) hours as needed for moderate pain. Patient not taking: Reported on 10/12/2014 07/04/14   Orson Eva, MD  oxyCODONE-acetaminophen (PERCOCET/ROXICET) 5-325 MG per tablet Take 1 tablet by mouth 2 (two) times daily as needed for severe pain. Patient not taking: Reported on 10/12/2014 07/25/14   Everlene Balls, MD  Rivaroxaban (XARELTO) 15 MG TABS tablet Take 1 tablet (15 mg total) by mouth 2 (two) times daily with a meal. Patient not taking: Reported on 10/12/2014 07/04/14   Orson Eva, MD  Vitamin D, Ergocalciferol, (DRISDOL) 50000 UNITS CAPS capsule Take 1 capsule (50,000 Units total) by mouth every 7 (seven) days. 07/06/14   Lorayne Marek, MD   BP 126/75 mmHg  Pulse 65  Temp(Src) 97.5 F (36.4 C) (Oral)  Resp 17  SpO2 98% Physical Exam  Constitutional: He is oriented to person, place, and time. He appears well-developed and well-nourished. No distress.  Anxious appearing  HENT:  Head: Normocephalic and atraumatic.  Mouth/Throat: Oropharynx is clear and moist. No oropharyngeal exudate.  Eyes: EOM are normal. Pupils are equal, round, and reactive to light.  Neck: Normal range of motion. Neck supple.  Cardiovascular: Normal rate and regular rhythm.   Pulmonary/Chest: Effort normal and breath sounds normal. No respiratory distress. He has no wheezes. He has no rales. He exhibits tenderness (chest tenderness is reproduced with palpation of the left lower chest. There is no crepitance or deformity.).  Abdominal: Soft. Bowel sounds are normal. He exhibits no distension and no mass. There is no tenderness. There is no rebound and no guarding.  Musculoskeletal: Normal range of motion. He exhibits no edema or tenderness.  No lower extremity swelling or pain.  Neurological: He is alert and oriented to person, place,  and time.  Involuntary facial twitching. Moves all extremities without deficit. Sensation is grossly intact.  Skin: Skin is warm and dry. No rash noted. No erythema.  Psychiatric: His behavior is normal.  Nursing note and vitals reviewed.   ED Course  Procedures (including critical care time) DIAGNOSTIC STUDIES: Oxygen Saturation is 95% on room air, normal by my interpretation.    COORDINATION OF CARE: 2:15 AM Discussed treatment plan with patient at beside, the patient agrees with the plan and has no further questions at this time.   Labs Review Labs Reviewed  CBC  BASIC METABOLIC PANEL  BRAIN NATRIURETIC PEPTIDE  I-STAT [place], ED  Randolm Idol, ED    Imaging Review Dg Chest 2 View  10/12/2014   CLINICAL DATA:  Acute  onset of generalized chest pain and shortness of breath. Initial encounter.  EXAM: CHEST  2 VIEW  COMPARISON:  Chest radiograph performed 07/02/2014, and CTA of the chest performed 10/02/2014  FINDINGS: The lungs are well-aerated. Vascular congestion is noted. Patchy bilateral airspace opacities may reflect multifocal pneumonia or pulmonary edema. No pleural effusion or pneumothorax is seen. There is no evidence of focal opacification, pleural effusion or pneumothorax.  The heart is borderline normal in size. The patient is status post median sternotomy. No acute osseous abnormalities are seen.  IMPRESSION: Vascular congestion noted. Patchy bilateral airspace opacities may reflect multifocal pneumonia or pulmonary edema.   Electronically Signed   By: Garald Balding M.D.   On: 10/12/2014 03:13     EKG Interpretation   Date/Time:  Thursday October 12 2014 01:31:01 EDT Ventricular Rate:  91 PR Interval:  182 QRS Duration: 144 QT Interval:  400 QTC Calculation: 492 R Axis:   -74 Text Interpretation:  Normal sinus rhythm Biatrial enlargement Left axis  deviation Right bundle branch block Anterior infarct , age undetermined  Abnormal ECG No significant change  since last tracing Confirmed by OTTER   MD, OLGA ([id number]) on 10/12/2014 1:33:55 AM      MDM   Final diagnoses:  Atypical chest pain    I personally performed the services described in this documentation, which was scribed in my presence. The recorded information has been reviewed and is accurate.    Patient is resting comfortably. Maintaining high 90s saturation on room air. Low suspicion for pneumonia. Normal white blood cell count and normal BNP. Troponin 2 is normal as well. EKG is unchanged from previous. The patient has a safe to be discharged home to follow-up with his primary physician. Return precautions have been given. Patient has been counseled to avoid cocaine.  Julianne Rice, MD 10/12/14 [phone]

## 2014-10-12 NOTE — ED Notes (Signed)
Dr. Lita Mains shown EKG

## 2014-10-13 ENCOUNTER — Ambulatory Visit: Payer: Self-pay | Attending: Internal Medicine | Admitting: Internal Medicine

## 2014-10-13 ENCOUNTER — Encounter: Payer: Self-pay | Admitting: Internal Medicine

## 2014-10-13 VITALS — BP 141/100 | HR 69 | Temp 98.0°F | Resp 16 | Wt 180.0 lb

## 2014-10-13 DIAGNOSIS — M545 Low back pain: Secondary | ICD-10-CM

## 2014-10-13 DIAGNOSIS — G8929 Other chronic pain: Secondary | ICD-10-CM

## 2014-10-13 DIAGNOSIS — M5126 Other intervertebral disc displacement, lumbar region: Secondary | ICD-10-CM

## 2014-10-13 DIAGNOSIS — I2699 Other pulmonary embolism without acute cor pulmonale: Secondary | ICD-10-CM

## 2014-10-13 DIAGNOSIS — I10 Essential (primary) hypertension: Secondary | ICD-10-CM

## 2014-10-13 NOTE — Progress Notes (Signed)
Patient here for follow up from the ED Was seen in the ED for chest pain Patient has a history of HTN and COPD

## 2014-10-13 NOTE — Progress Notes (Signed)
MRN: [id number] Name: Jeremy Booker  Sex: male Age: 49 y.o. DOB: 08-18-1965  Allergies: Morphine and related; Dilaudid; and Naprosyn  Chief Complaint  Patient presents with  . Follow-up    HPI: Patient is 49 y.o. male who Patient has history of hypertension COPD, congenital heart disease, PE, recently went to the emergency room with symptoms of on and off chest pain for several months, EMR reviewed patient had her EKG done which was not significantly change patient had a negative troponins. Patient has already followed up with pulmonology and is rescheduled to get chest x-ray done in 4 weeks, he is also complaining of chronic lower back pain he had an MRI done which reported disc protrusion.  Past Medical History  Diagnosis Date  . COPD (chronic obstructive pulmonary disease)   . Hypertension     a. Dx 6 yrs ago - untreated.  . Tobacco abuse     a. 30 pack years.  Marland Kitchen ETOH abuse     a. 06/2014 currently a few bottles of wine/week.  . Drug abuse     a. 06/2014 Cocaine/Marijuana - last used a few months ago.  . Congenital heart disease     a. thinks he had "a hole in my heart" s/p surgical correction @ age 22 and then age 73.  . Chest pain     a. 2010 s/p cath in [place], [place] - reportedly nl.  . Pulmonary embolism     Past Surgical History  Procedure Laterality Date  . Cardiac surgery      congenital       Medication List       This list is accurate as of: 10/13/14  6:11 PM.  Always use your most recent med list.               albuterol 108 (90 BASE) MCG/ACT inhaler  Commonly known as:  PROVENTIL HFA;VENTOLIN HFA  Inhale 2 puffs into the lungs every 6 (six) hours as needed for wheezing or shortness of breath.     cloNIDine 0.1 MG tablet  Commonly known as:  CATAPRES  One three times daily     diazepam 2 MG tablet  Commonly known as:  VALIUM  Take 1 tablet (2 mg total) by mouth every 12 (twelve) hours as needed for muscle spasms.     gabapentin 400 MG capsule    Commonly known as:  NEURONTIN  Take 1 capsule (400 mg total) by mouth 3 (three) times daily.     HYDROcodone-acetaminophen 5-325 MG per tablet  Commonly known as:  NORCO/VICODIN  Take 1-2 tablets by mouth every 4 (four) hours as needed for moderate pain.     hydrocortisone cream 1 %  Apply 1 application topically 2 (two) times daily.     methocarbamol 500 MG tablet  Commonly known as:  ROBAXIN  Take 1 tablet (500 mg total) by mouth 2 (two) times daily.     oxyCODONE-acetaminophen 5-325 MG per tablet  Commonly known as:  PERCOCET/ROXICET  Take 1 tablet by mouth 2 (two) times daily as needed for severe pain.     rivaroxaban 20 MG Tabs tablet  Commonly known as:  XARELTO  Take 1 tablet (20 mg total) by mouth daily with supper. Start AFTER finished with 15mg  pills     traMADol 50 MG tablet  Commonly known as:  ULTRAM  Take 1 tablet (50 mg total) by mouth every 6 (six) hours as needed.     Vitamin  D (Ergocalciferol) 50000 UNITS Caps capsule  Commonly known as:  DRISDOL  Take 1 capsule (50,000 Units total) by mouth every 7 (seven) days.        No orders of the defined types were placed in this encounter.     There is no immunization history on file for this patient.  Family History  Problem Relation Age of Onset  . Hypertension Mother     alive  . Other Father     died in late 81's - ? cause  . Hypertension Maternal Uncle   . Heart disease Maternal Uncle     History  Substance Use Topics  . Smoking status: Former Smoker -- 0.25 packs/day for 30 years    Types: Cigarettes    Quit date: 06/12/2014  . Smokeless tobacco: Never Used  . Alcohol Use: No     Comment: Previously drank heavily on a daily basis.  Now drinks a few bottles of wine 1-2 x /wk (06/2014)    Review of Systems   As noted in HPI  Filed Vitals:   10/13/14 1700  BP: 141/100  Pulse: 69  Temp: 98 F (36.7 C)  Resp: 16    Physical Exam  Physical Exam  Eyes: EOM are normal. Pupils are  equal, round, and reactive to light.  Cardiovascular: Normal rate and regular rhythm.   Pulmonary/Chest: No respiratory distress. He has no wheezes. He has no rales.  Musculoskeletal:  Lumbar paraspinal tenderness    CBC    Component Value Date/Time   WBC 7.3 10/12/2014 0134   RBC 4.66 10/12/2014 0134   HGB 14.6 10/12/2014 0134   HCT 42.3 10/12/2014 0134   PLT 302 10/12/2014 0134   MCV 90.8 10/12/2014 0134   LYMPHSABS 2.1 03/18/2010 1358   MONOABS 0.9 03/18/2010 1358   EOSABS 0.1 03/18/2010 1358   BASOSABS 0.1 03/18/2010 1358    CMP     Component Value Date/Time   NA 138 10/12/2014 0134   K 3.7 10/12/2014 0134   CL 103 10/12/2014 0134   CO2 23 10/12/2014 0134   GLUCOSE 83 10/12/2014 0134   BUN 10 10/12/2014 0134   CREATININE 1.23 10/12/2014 0134   CREATININE 1.06 07/05/2014 1125   CALCIUM 9.1 10/12/2014 0134   PROT 8.1 07/05/2014 1125   ALBUMIN 4.5 07/05/2014 1125   AST 43* 07/05/2014 1125   ALT 37 07/05/2014 1125   ALKPHOS 82 07/05/2014 1125   BILITOT 0.5 07/05/2014 1125   GFRNONAA >60 10/12/2014 0134   GFRNONAA 83 07/05/2014 1125   GFRAA >60 10/12/2014 0134   GFRAA >89 07/05/2014 1125    Lab Results  Component Value Date/Time   CHOL 192 07/03/2014 01:30 AM    Lab Results  Component Value Date/Time   HGBA1C 5.3 07/05/2014 11:25 AM    Lab Results  Component Value Date/Time   AST 43* 07/05/2014 11:25 AM    Assessment and Plan  Essential hypertension Pressure is borderline elevated, advise patient for DASH diet, continue with clonidine as prescribed  Pulmonary embolus Currently on Xarelto following up with pulmonology  Protruded lumbar disc/Chronic lower back pain - Plan: Ambulatory referral to Neurosurgery    Return in about 3 months (around 01/13/2015), or if symptoms worsen or fail to improve.   This note has been created with Surveyor, quantity. Any transcriptional errors are unintentional.     Lorayne Marek, MD

## 2014-10-14 ENCOUNTER — Encounter: Payer: Self-pay | Admitting: Internal Medicine

## 2014-10-14 DIAGNOSIS — R918 Other nonspecific abnormal finding of lung field: Secondary | ICD-10-CM | POA: Insufficient documentation

## 2014-10-14 NOTE — Assessment & Plan Note (Signed)
D/c lisinopril 07/20/2014 due to very prominent pseudowheeze > rec clonidine > resolved   Would avoid acei here and since he apparently has drug abuse hx rx increase clonidine if needed as can offset narcotic w/d symptoms due to its effect of the locus cerelius > defer to primary care

## 2014-10-14 NOTE — Assessment & Plan Note (Addendum)
10/13/2014   Walked RA x one lap @ 185 stopped due to  untsteady on feet/ no desats at nl pace, min sob     Note h/o cocaine use strongly suspect this or other elicit drugs contributing to atypical symptoms/ infiltrates  Though pt vehemently denies any h/o drug use (note    pos cocaine and MJ documented in EPIC as recently as 06/2014 ) . rec avoid all street drugs/ f/u as planned

## 2014-10-14 NOTE — Assessment & Plan Note (Signed)
Quit smoking 06/2014 so moderate risk CT Chest  07/02/14 > RUL nodule > 10/02/14 repeat> No new foci of pulmonary embolus. Currently there is a focal area of pulmonary embolus involving a portion of the superior segment of the right lower lobe pulmonary artery. Suspect chronic change given prior studies.  Underlying areas of parenchymal lung scarring, stable. Nodular opacities in the right upper lobe remain. Largest nodular opacity measures 1.2 x 0.7 cm. No new opacities are identified. No frank airspace consolidation. No adenopathy.> rec f/u 6 m (placed in tickle)  Explained this is not the cause of his L chest wall pain and f/u as planned already in computer for recall 03/2015

## 2014-10-16 ENCOUNTER — Ambulatory Visit: Payer: Self-pay | Admitting: Internal Medicine

## 2014-10-16 ENCOUNTER — Ambulatory Visit: Payer: Self-pay

## 2014-10-17 ENCOUNTER — Ambulatory Visit (HOSPITAL_COMMUNITY): Payer: Self-pay | Attending: Internal Medicine

## 2014-10-17 ENCOUNTER — Telehealth: Payer: Self-pay | Admitting: Internal Medicine

## 2014-10-17 DIAGNOSIS — I2699 Other pulmonary embolism without acute cor pulmonale: Secondary | ICD-10-CM | POA: Insufficient documentation

## 2014-10-17 NOTE — Telephone Encounter (Signed)
lmtcb x1 for pt. 

## 2014-10-17 NOTE — Telephone Encounter (Signed)
Nothing else to offer > suggest he return to [facility] (can go to ER there if condition worsens and they will get him plugged back in to appropriate service as he's already been to ER and office here)

## 2014-10-17 NOTE — Telephone Encounter (Signed)
Pt calling requesting an order be sent for him to be started on O2 States that he has been having increased SOB, body shakes and chest tightness.  Pt feels like he is not getting enough O2.  Please advise Dr Melvyn Novas. Thanks.

## 2014-10-18 NOTE — Telephone Encounter (Signed)
Pt informed of Dr Gustavus Bryant recommendations.  Pt verbalized understanding .  Nothing further is needed.

## 2014-11-09 ENCOUNTER — Ambulatory Visit: Payer: Self-pay | Admitting: Internal Medicine

## 2014-11-20 ENCOUNTER — Telehealth: Payer: Self-pay | Admitting: Internal Medicine

## 2014-11-20 NOTE — Telephone Encounter (Signed)
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

## 2014-11-20 NOTE — Telephone Encounter (Signed)
Pt requesting letter for food stamps and Medicaid application stating that he is unable to work due to medical conditions. Pt requests to speak to Dr.

## 2014-11-21 NOTE — Telephone Encounter (Signed)
Patient called to request a letter for his food stamp worker stating that he can not work due to his medical condition. Please f/u with pt.

## 2014-12-01 ENCOUNTER — Ambulatory Visit: Payer: Self-pay | Attending: Internal Medicine | Admitting: Internal Medicine

## 2014-12-01 ENCOUNTER — Encounter: Payer: Self-pay | Admitting: Internal Medicine

## 2014-12-01 VITALS — BP 140/74 | HR 64 | Temp 98.0°F | Resp 15 | Wt 177.8 lb

## 2014-12-01 DIAGNOSIS — I2699 Other pulmonary embolism without acute cor pulmonale: Secondary | ICD-10-CM | POA: Insufficient documentation

## 2014-12-01 DIAGNOSIS — M545 Low back pain: Secondary | ICD-10-CM | POA: Insufficient documentation

## 2014-12-01 DIAGNOSIS — G8929 Other chronic pain: Secondary | ICD-10-CM | POA: Insufficient documentation

## 2014-12-01 DIAGNOSIS — M5126 Other intervertebral disc displacement, lumbar region: Secondary | ICD-10-CM | POA: Insufficient documentation

## 2014-12-01 DIAGNOSIS — I10 Essential (primary) hypertension: Secondary | ICD-10-CM | POA: Insufficient documentation

## 2014-12-01 NOTE — Patient Instructions (Signed)
DASH Eating Plan °DASH stands for "Dietary Approaches to Stop Hypertension." The DASH eating plan is a healthy eating plan that has been shown to reduce high blood pressure (hypertension). Additional health benefits may include reducing the risk of type 2 diabetes mellitus, heart disease, and stroke. The DASH eating plan may also help with weight loss. °WHAT DO I NEED TO KNOW ABOUT THE DASH EATING PLAN? °For the DASH eating plan, you will follow these general guidelines: °· Choose foods with a percent daily value for sodium of less than 5% (as listed on the food label). °· Use salt-free seasonings or herbs instead of table salt or sea salt. °· Check with your health care provider or pharmacist before using salt substitutes. °· Eat lower-sodium products, often labeled as "lower sodium" or "no salt added." °· Eat fresh foods. °· Eat more vegetables, fruits, and low-fat dairy products. °· Choose whole grains. Look for the word "whole" as the first word in the ingredient list. °· Choose fish and skinless chicken or turkey more often than red meat. Limit fish, poultry, and meat to 6 oz (170 g) each day. °· Limit sweets, desserts, sugars, and sugary drinks. °· Choose heart-healthy fats. °· Limit cheese to 1 oz (28 g) per day. °· Eat more home-cooked food and less restaurant, buffet, and fast food. °· Limit fried foods. °· Cook foods using methods other than frying. °· Limit canned vegetables. If you do use them, rinse them well to decrease the sodium. °· When eating at a restaurant, ask that your food be prepared with less salt, or no salt if possible. °WHAT FOODS CAN I EAT? °Seek help from a dietitian for individual calorie needs. °Grains °Whole grain or whole wheat bread. Brown rice. Whole grain or whole wheat pasta. Quinoa, bulgur, and whole grain cereals. Low-sodium cereals. Corn or whole wheat flour tortillas. Whole grain cornbread. Whole grain crackers. Low-sodium crackers. °Vegetables °Fresh or frozen vegetables  (raw, steamed, roasted, or grilled). Low-sodium or reduced-sodium tomato and vegetable juices. Low-sodium or reduced-sodium tomato sauce and paste. Low-sodium or reduced-sodium canned vegetables.  °Fruits °All fresh, canned (in natural juice), or frozen fruits. °Meat and Other Protein Products °Ground beef (85% or leaner), grass-fed beef, or beef trimmed of fat. Skinless chicken or turkey. Ground chicken or turkey. Pork trimmed of fat. All fish and seafood. Eggs. Dried beans, peas, or lentils. Unsalted nuts and seeds. Unsalted canned beans. °Dairy °Low-fat dairy products, such as skim or 1% milk, 2% or reduced-fat cheeses, low-fat ricotta or cottage cheese, or plain low-fat yogurt. Low-sodium or reduced-sodium cheeses. °Fats and Oils °Tub margarines without trans fats. Light or reduced-fat mayonnaise and salad dressings (reduced sodium). Avocado. Safflower, olive, or canola oils. Natural peanut or almond butter. °Other °Unsalted popcorn and pretzels. °The items listed above may not be a complete list of recommended foods or beverages. Contact your dietitian for more options. °WHAT FOODS ARE NOT RECOMMENDED? °Grains °White bread. White pasta. White rice. Refined cornbread. Bagels and croissants. Crackers that contain trans fat. °Vegetables °Creamed or fried vegetables. Vegetables in a cheese sauce. Regular canned vegetables. Regular canned tomato sauce and paste. Regular tomato and vegetable juices. °Fruits °Dried fruits. Canned fruit in light or heavy syrup. Fruit juice. °Meat and Other Protein Products °Fatty cuts of meat. Ribs, chicken wings, bacon, sausage, bologna, salami, chitterlings, fatback, hot dogs, bratwurst, and packaged luncheon meats. Salted nuts and seeds. Canned beans with salt. °Dairy °Whole or 2% milk, cream, half-and-half, and cream cheese. Whole-fat or sweetened yogurt. Full-fat   cheeses or blue cheese. Nondairy creamers and whipped toppings. Processed cheese, cheese spreads, or cheese  curds. °Condiments °Onion and garlic salt, seasoned salt, table salt, and sea salt. Canned and packaged gravies. Worcestershire sauce. Tartar sauce. Barbecue sauce. Teriyaki sauce. Soy sauce, including reduced sodium. Steak sauce. Fish sauce. Oyster sauce. Cocktail sauce. Horseradish. Ketchup and mustard. Meat flavorings and tenderizers. Bouillon cubes. Hot sauce. Tabasco sauce. Marinades. Taco seasonings. Relishes. °Fats and Oils °Butter, stick margarine, lard, shortening, ghee, and bacon fat. Coconut, palm kernel, or palm oils. Regular salad dressings. °Other °Pickles and olives. Salted popcorn and pretzels. °The items listed above may not be a complete list of foods and beverages to avoid. Contact your dietitian for more information. °WHERE CAN I FIND MORE INFORMATION? °National [employer], [employer], [employer] Institute: www.nhlbi.nih.gov/health/health-topics/topics/dash/ °Document Released: 04/17/2011 Document Revised: 09/12/2013 Document Reviewed: 03/02/2013 °ExitCare® Patient Information ©2015 [employer]. This information is not intended to replace advice given to you by your health care provider. Make sure you discuss any questions you have with your health care provider. ° °

## 2014-12-01 NOTE — Progress Notes (Signed)
Patient here for follow up on his hypertension and chronic back pain Patient is also requesting a letter stating he currently is not in the position to work Due to his disability

## 2014-12-01 NOTE — Progress Notes (Signed)
MRN: [id number] Name: Jeremy Booker  Sex: male Age: 49 y.o. DOB: 1965-12-13  Allergies: Morphine and related; Dilaudid; and Naprosyn  Chief Complaint  Patient presents with  . Follow-up    HPI: Patient is 49 y.o. male who has history of emphysema, hypertension, PE, chronic lower back pain with MRI done previously reported disc protrusion, patient had been referred to neurosurgery for further evaluation, as per patient he needs financial assistance and would like to see the financial counselor, she's also taking Xarelto for PE using albuterol when necessary, already has scheduled appointment with his pulmonologist.today's blood pressure is borderline elevated denies any headache dizziness or chest pain.  Past Medical History  Diagnosis Date  . COPD (chronic obstructive pulmonary disease)   . Hypertension     a. Dx 6 yrs ago - untreated.  . Tobacco abuse     a. 30 pack years.  Marland Kitchen ETOH abuse     a. 06/2014 currently a few bottles of wine/week.  . Drug abuse     a. 06/2014 Cocaine/Marijuana - last used a few months ago.  . Congenital heart disease     a. thinks he had "a hole in my heart" s/p surgical correction @ age 54 and then age 67.  . Chest pain     a. 2010 s/p cath in [place], [place] - reportedly nl.  . Pulmonary embolism     Past Surgical History  Procedure Laterality Date  . Cardiac surgery      congenital       Medication List       This list is accurate as of: 12/01/14 12:00 PM.  Always use your most recent med list.               albuterol 108 (90 BASE) MCG/ACT inhaler  Commonly known as:  PROVENTIL HFA;VENTOLIN HFA  Inhale 2 puffs into the lungs every 6 (six) hours as needed for wheezing or shortness of breath.     cloNIDine 0.1 MG tablet  Commonly known as:  CATAPRES  One three times daily     diazepam 2 MG tablet  Commonly known as:  VALIUM  Take 1 tablet (2 mg total) by mouth every 12 (twelve) hours as needed for muscle spasms.     gabapentin 400  MG capsule  Commonly known as:  NEURONTIN  Take 1 capsule (400 mg total) by mouth 3 (three) times daily.     HYDROcodone-acetaminophen 5-325 MG per tablet  Commonly known as:  NORCO/VICODIN  Take 1-2 tablets by mouth every 4 (four) hours as needed for moderate pain.     hydrocortisone cream 1 %  Apply 1 application topically 2 (two) times daily.     methocarbamol 500 MG tablet  Commonly known as:  ROBAXIN  Take 1 tablet (500 mg total) by mouth 2 (two) times daily.     oxyCODONE-acetaminophen 5-325 MG per tablet  Commonly known as:  PERCOCET/ROXICET  Take 1 tablet by mouth 2 (two) times daily as needed for severe pain.     rivaroxaban 20 MG Tabs tablet  Commonly known as:  XARELTO  Take 1 tablet (20 mg total) by mouth daily with supper. Start AFTER finished with 15mg  pills     traMADol 50 MG tablet  Commonly known as:  ULTRAM  Take 1 tablet (50 mg total) by mouth every 6 (six) hours as needed.     Vitamin D (Ergocalciferol) 50000 UNITS Caps capsule  Commonly known as:  DRISDOL  Take 1 capsule (50,000 Units total) by mouth every 7 (seven) days.        No orders of the defined types were placed in this encounter.     There is no immunization history on file for this patient.  Family History  Problem Relation Age of Onset  . Hypertension Mother     alive  . Other Father     died in late 32's - ? cause  . Hypertension Maternal Uncle   . Heart disease Maternal Uncle     History  Substance Use Topics  . Smoking status: Former Smoker -- 0.25 packs/day for 30 years    Types: Cigarettes    Quit date: 06/12/2014  . Smokeless tobacco: Never Used  . Alcohol Use: No     Comment: Previously drank heavily on a daily basis.  Now drinks a few bottles of wine 1-2 x /wk (06/2014)    Review of Systems   As noted in HPI  Filed Vitals:   12/01/14 1118  BP: 140/74  Pulse: 64  Temp: 98 F (36.7 C)  Resp: 15    Physical Exam  Physical Exam  Constitutional: No  distress.  Eyes: EOM are normal. Pupils are equal, round, and reactive to light.  Cardiovascular: Normal rate and regular rhythm.   Pulmonary/Chest: Breath sounds normal. No respiratory distress. He has no wheezes. He has no rales.  Musculoskeletal:  Low lumbar paraspinal tenderness.    CBC    Component Value Date/Time   WBC 7.3 10/12/2014 0134   RBC 4.66 10/12/2014 0134   HGB 14.6 10/12/2014 0134   HCT 42.3 10/12/2014 0134   PLT 302 10/12/2014 0134   MCV 90.8 10/12/2014 0134   LYMPHSABS 2.1 03/18/2010 1358   MONOABS 0.9 03/18/2010 1358   EOSABS 0.1 03/18/2010 1358   BASOSABS 0.1 03/18/2010 1358    CMP     Component Value Date/Time   NA 138 10/12/2014 0134   K 3.7 10/12/2014 0134   CL 103 10/12/2014 0134   CO2 23 10/12/2014 0134   GLUCOSE 83 10/12/2014 0134   BUN 10 10/12/2014 0134   CREATININE 1.23 10/12/2014 0134   CREATININE 1.06 07/05/2014 1125   CALCIUM 9.1 10/12/2014 0134   PROT 8.1 07/05/2014 1125   ALBUMIN 4.5 07/05/2014 1125   AST 43* 07/05/2014 1125   ALT 37 07/05/2014 1125   ALKPHOS 82 07/05/2014 1125   BILITOT 0.5 07/05/2014 1125   GFRNONAA >60 10/12/2014 0134   GFRNONAA 83 07/05/2014 1125   GFRAA >60 10/12/2014 0134   GFRAA >89 07/05/2014 1125    Lab Results  Component Value Date/Time   CHOL 192 07/03/2014 01:30 AM    Lab Results  Component Value Date/Time   HGBA1C 5.3 07/05/2014 11:25 AM    Lab Results  Component Value Date/Time   AST 43* 07/05/2014 11:25 AM    Assessment and Plan  Essential hypertension Blood pressure is borderline elevated, advise patient for DASH diet continue with clonidine  Protruded lumbar disc/Chronic lower back pain  - Plan: pain medication when necessary, Ambulatory referral to Neurosurgery  Pulmonary embolus/emphysema Continue with albuterol, Xarelto patient to  follow with pulmonology.   Return in about 3 months (around 03/03/2015), or if symptoms worsen or fail to improve.   This note has been  created with Surveyor, quantity. Any transcriptional errors are unintentional.    Lorayne Marek, MD

## 2014-12-04 ENCOUNTER — Ambulatory Visit (INDEPENDENT_AMBULATORY_CARE_PROVIDER_SITE_OTHER): Payer: Self-pay | Admitting: Internal Medicine

## 2014-12-04 ENCOUNTER — Encounter: Payer: Self-pay | Admitting: Internal Medicine

## 2014-12-04 VITALS — BP 112/64 | HR 68 | Ht 71.0 in | Wt 180.0 lb

## 2014-12-04 DIAGNOSIS — R0600 Dyspnea, unspecified: Secondary | ICD-10-CM | POA: Insufficient documentation

## 2014-12-04 DIAGNOSIS — R911 Solitary pulmonary nodule: Secondary | ICD-10-CM

## 2014-12-04 DIAGNOSIS — R0789 Other chest pain: Secondary | ICD-10-CM

## 2014-12-04 NOTE — Progress Notes (Signed)
Subjective:    Patient ID: Jeremy Booker, male    DOB: 02-05-1966       MRN: [id number]    Brief patient profile:  48 yobm quit smoking 09/2014  When last doing well  2006 with  baseline doe x half block stopped by back pain >  breathing now referred 07/20/2014 by Dr Jeremy Booker for eval of cp after pe  Admit date: 07/02/2014 Discharge date: 07/04/2014    Discharge Diagnoses:  RLL Pulmonary Embolus -heparin drip started at the time of admission -I discussed the risks, benefits, and alternatives of warfarin versus factor Xa inhibitors-->pt chose rivaroxaban -echo--EF 55-60%, no WMA, normal RV -Discontinue heparin drip--> start rivaroxaban 07/03/14 -pt tolerated Xarelto without complications--continue 15mg  bid x 20 days then 20mg  daily -followup appt set up at [facility] Atypical chest pain -troponins--neg x 3 -Echo--EF 55-60%, no RV strain, no WMA -CTA chest--+RLL PE, no dissection -appreciate cardiology input -Norco 5/325, #30 given at time of d/c   07/20/2014 1st Jeremy Booker Pulmonary office visit/ Jeremy Booker  / on ACEi  Chief Complaint  Patient presents with  . Pulmonary Consult    Referred by Dr Jeremy Booker for eval of PE. Pt states that he has had problems with his breathing "for all my life"- worse in Feb 2016 with dx of "scar tissue in lungs" and PE.  Pt states has "constant CP"-worse when he feels SOB.  He states that he is SOB all of the time- with or without any exertion.   cp is acute on chronic (present for years) ant on the left and worse when lies down but not pleuritic. Assoc with chronic raspy dry day > noct cough  rec Clonidine 0.1 mg three times daily  Stop lisinopril and your breathing should gradually improve back  to where it was  Congratulations on not smoking -  It's the most important aspect of your care    10/12/2014 f/u ov/Jeremy Booker re: new pattern sob/ old pattern cp  Chief Complaint  Patient presents with  . Follow-up    Pt c/o increased  SOB and CP- "for a while"- went to ED this am for these symptoms.   Onset was insidious sob not necessarily related to ex/ "pleuritic" cp on the L ever since PE was dx'd  rec Refer to Pain clinic   12/04/2014 f/u ov/Jeremy Booker re:  Sob/ chronic pain syndrome  Chief Complaint  Patient presents with  . Follow-up    Pt states his breathing is unchanged since the last visit. He is asking for refills on pain meds.   sob x one or two aisles so uses cart, can't get across a parking lot s giving out due to sob/ back  Feels urge to clear his throat day > noct but no mucus production Pain left of sternum x > 5 y but a lot worse since admit with PE feb 2016   No obvious day to day or daytime variability or assoc chronic cough or   chest tightness, subjective wheeze or overt sinus or hb symptoms. No unusual exp hx or h/o childhood pna/ asthma or knowledge of premature birth.  Sleeping ok without nocturnal  or early am exacerbation  of respiratory  c/o's or need for noct saba. Also denies any obvious fluctuation of symptoms with weather or environmental changes or other aggravating or alleviating factors except as outlined above   Current Medications, Allergies, Complete Past Medical History, Past Surgical History, Family History, and Social History  were reviewed in [facility] record.  ROS  The following are not active complaints unless bolded sore throat, dysphagia, dental problems, itching, sneezing,  nasal congestion or excess/ purulent secretions, ear ache,   fever, chills, sweats, unintended wt loss, classically pleuritic or exertional cp, hemoptysis,  orthopnea pnd or leg swelling, presyncope, palpitations, abdominal pain, anorexia, nausea, vomiting, diarrhea  or change in bowel or bladder habits, change in stools or urine, dysuria,hematuria,  rash, arthralgias, visual complaints, headache, numbness, weakness or ataxia or problems with walking or coordination,  change in mood/affect  or memory.                   Objective:   Physical Exam  amb  black male very unusual affect / harsh throat clearing/ mild pseudowheeze   10/12/14             181 >  12/04/14  183  Wt Readings from Last 3 Encounters:  07/20/14 178 lb 12.8 oz (81.103 kg)  07/05/14 179 lb (81.194 kg)  07/04/14 173 lb 3.2 oz (78.563 kg)    Vital signs reviewed  HEENT: nl dentition, turbinates, and orophanx. Nl external ear canals without cough reflex   NECK :  without JVD/Nodes/TM/ nl carotid upstrokes bilaterally   LUNGS: no acc muscle use, clear to A and P bilaterally without cough on insp or exp maneuvers   CV:  RRR  no s3 or murmur or increase in P2, no edema   ABD:  soft and nontender with nl excursion in the supine position. No bruits or organomegaly, bowel sounds nl  MS:  warm without deformities, calf tenderness, cyanosis or clubbing  SKIN: warm and dry without lesions    NEURO:  alert, approp, no deficits    I personally reviewed images and agree with radiology impression as follows:  CTa Chest 10/02/14 No new foci of pulmonary embolus. Currently there is a focal area of pulmonary embolus involving a portion of the superior segment of the right lower lobe pulmonary artery. Suspect chronic change given prior studies.  Underlying areas of parenchymal lung scarring, stable. Nodular opacities in the right upper lobe remain. Largest nodular opacity measures 1.2 x 0.7 cm. No new opacities are identified. No frank airspace consolidation. No adenopathy.  10/12/14 cxr Vascular congestion noted. Patchy bilateral airspace opacities may reflect multifocal pneumonia or pulmonary edema. My review: not as deep a breath on today's film as comparison cxr from 07/02/14       Assessment & Plan:

## 2014-12-04 NOTE — Patient Instructions (Signed)
For breathing: Only use your albuterol as a rescue medication to be used if you can't catch your breath by resting or doing a relaxed purse lip breathing pattern.  - The less you use it, the better it will work when you need it. - Ok to use up to 2 puffs  every 4 hours if you must but call for immediate appointment if use goes up over your usual need - Don't leave home without it !!  (think of it like the spare tire for your car)   No pain medications through this office  We will contact you in August for follow up CT chest

## 2014-12-06 ENCOUNTER — Other Ambulatory Visit: Payer: Self-pay | Admitting: Internal Medicine

## 2014-12-10 ENCOUNTER — Encounter: Payer: Self-pay | Admitting: Internal Medicine

## 2014-12-10 NOTE — Assessment & Plan Note (Signed)
Quit smoking 06/2014 so moderate risk CT Chest  07/02/14 > RUL nodule > 10/02/14 repeat> No new foci of pulmonary embolus. Currently there is a focal area of pulmonary embolus involving a portion of the superior segment of the right lower lobe pulmonary artery. Suspect chronic change given prior studies.  Underlying areas of parenchymal lung scarring, stable. Nodular opacities in the right upper lobe remain. Largest nodular opacity measures 1.2 x 0.7 cm. No new opacities are identified. No frank airspace consolidation. No adenopathy.> rec f/u 6 m = 12/31/14   Obviously this has nothing at all to do with his chronic L Ant cp > rec keep f/u CT

## 2014-12-10 NOTE — Assessment & Plan Note (Signed)
No-12/04/2014   Walked RA x 2  laps @ 185 ft each/ stopped due to  Dizzy/ fatigue, no sob or desats  - spirometry 12/04/2014 no obstruction/ nl in effort independent portion but very poor effort early part of the f/v loop   No evidence of asthma > he either has severe deconditioning or his is malingering. Nothing else to offer in this clinic

## 2014-12-10 NOTE — Assessment & Plan Note (Signed)
Chronic / non-specific with no correlation with CT findings > no further pulmonary w/u needed nor any narcs via this office.

## 2014-12-11 ENCOUNTER — Other Ambulatory Visit: Payer: Self-pay

## 2014-12-11 ENCOUNTER — Telehealth: Payer: Self-pay | Admitting: Internal Medicine

## 2014-12-11 NOTE — Telephone Encounter (Signed)
Did this patient specify why he need to see cardiologist?

## 2014-12-11 NOTE — Telephone Encounter (Signed)
Pt requests referral to Cardiologist.  I emailed Alinda Sierras to see if a referral was in and she recommended I send a note to the provider to request a referral.

## 2014-12-21 ENCOUNTER — Emergency Department (HOSPITAL_COMMUNITY): Payer: Self-pay

## 2014-12-21 ENCOUNTER — Emergency Department (HOSPITAL_COMMUNITY)
Admission: EM | Admit: 2014-12-21 | Discharge: 2014-12-21 | Disposition: A | Discharge status: Home / Self Care | Payer: Self-pay | Attending: Emergency Medicine | Admitting: Emergency Medicine

## 2014-12-21 ENCOUNTER — Encounter (HOSPITAL_COMMUNITY): Payer: Self-pay | Admitting: Emergency Medicine

## 2014-12-21 DIAGNOSIS — Z86711 Personal history of pulmonary embolism: Secondary | ICD-10-CM | POA: Insufficient documentation

## 2014-12-21 DIAGNOSIS — R079 Chest pain, unspecified: Secondary | ICD-10-CM | POA: Insufficient documentation

## 2014-12-21 DIAGNOSIS — Z87891 Personal history of nicotine dependence: Secondary | ICD-10-CM | POA: Insufficient documentation

## 2014-12-21 DIAGNOSIS — I10 Essential (primary) hypertension: Secondary | ICD-10-CM | POA: Insufficient documentation

## 2014-12-21 DIAGNOSIS — Z79899 Other long term (current) drug therapy: Secondary | ICD-10-CM | POA: Insufficient documentation

## 2014-12-21 DIAGNOSIS — Z8774 Personal history of (corrected) congenital malformations of heart and circulatory system: Secondary | ICD-10-CM | POA: Insufficient documentation

## 2014-12-21 DIAGNOSIS — J441 Chronic obstructive pulmonary disease with (acute) exacerbation: Secondary | ICD-10-CM | POA: Insufficient documentation

## 2014-12-21 DIAGNOSIS — M7981 Nontraumatic hematoma of soft tissue: Secondary | ICD-10-CM | POA: Insufficient documentation

## 2014-12-21 LAB — CBC WITH DIFFERENTIAL/PLATELET
BASOS PCT: 0 % (ref 0–1)
Basophils Absolute: 0 10*3/uL (ref 0.0–0.1)
EOS PCT: 2 % (ref 0–5)
Eosinophils Absolute: 0.1 10*3/uL (ref 0.0–0.7)
HEMATOCRIT: 43.7 % (ref 39.0–52.0)
HEMOGLOBIN: 14.8 g/dL (ref 13.0–17.0)
Lymphocytes Relative: 31 % (ref 12–46)
Lymphs Abs: 1.5 10*3/uL (ref 0.7–4.0)
MCH: 31.4 pg (ref 26.0–34.0)
MCHC: 33.9 g/dL (ref 30.0–36.0)
MCV: 92.6 fL (ref 78.0–100.0)
MONO ABS: 0.5 10*3/uL (ref 0.1–1.0)
Monocytes Relative: 11 % (ref 3–12)
Neutro Abs: 2.7 10*3/uL (ref 1.7–7.7)
Neutrophils Relative %: 56 % (ref 43–77)
Platelets: 268 10*3/uL (ref 150–400)
RBC: 4.72 MIL/uL (ref 4.22–5.81)
RDW: 13.6 % (ref 11.5–15.5)
WBC: 4.8 10*3/uL (ref 4.0–10.5)

## 2014-12-21 LAB — BASIC METABOLIC PANEL
ANION GAP: 7 (ref 5–15)
BUN: 6 mg/dL (ref 6–20)
CALCIUM: 9.3 mg/dL (ref 8.9–10.3)
CO2: 25 mmol/L (ref 22–32)
CREATININE: 1.02 mg/dL (ref 0.61–1.24)
Chloride: 107 mmol/L (ref 101–111)
GFR calc Af Amer: >60 mL/min (ref >60)
GFR calc non Af Amer: >60 mL/min (ref >60)
GLUCOSE: 100 mg/dL — AB (ref 65–99)
Potassium: 3.7 mmol/L (ref 3.5–5.1)
Sodium: 139 mmol/L (ref 135–145)

## 2014-12-21 LAB — I-STAT TROPONIN, ED: TROPONIN I, POC: 0 ng/mL (ref 0.00–0.08)

## 2014-12-21 LAB — BRAIN NATRIURETIC PEPTIDE: B NATRIURETIC PEPTIDE 5: 65.3 pg/mL (ref 0.0–100.0)

## 2014-12-21 MED ORDER — ONDANSETRON HCL 4 MG/2ML IJ SOLN
4.0000 mg | Freq: Once | INTRAMUSCULAR | Status: AC
  Administered 2014-12-21: 4 mg via INTRAVENOUS
  Filled 2014-12-21: qty 2

## 2014-12-21 MED ORDER — IOHEXOL 350 MG/ML SOLN
80.0000 mL | Freq: Once | INTRAVENOUS | Status: AC | PRN
  Administered 2014-12-21: 80 mL via INTRAVENOUS

## 2014-12-21 MED ORDER — SODIUM CHLORIDE 0.9 % IV SOLN: INTRAVENOUS | Status: DC

## 2014-12-21 MED ORDER — SODIUM CHLORIDE 0.9 % IV BOLUS (SEPSIS)
1000.0000 mL | Freq: Once | INTRAVENOUS | Status: AC
  Administered 2014-12-21: 1000 mL via INTRAVENOUS

## 2014-12-21 MED ORDER — HYDROCODONE-ACETAMINOPHEN 5-325 MG PO TABS
1.0000 | ORAL_TABLET | Freq: Four times a day (QID) | ORAL | Status: DC | PRN
Start: 2014-12-21 — End: 2015-01-16

## 2014-12-21 MED ORDER — HYDROMORPHONE HCL 1 MG/ML IJ SOLN
1.0000 mg | Freq: Once | INTRAMUSCULAR | Status: AC
  Administered 2014-12-21: 1 mg via INTRAVENOUS
  Filled 2014-12-21: qty 1

## 2014-12-21 NOTE — ED Notes (Signed)
Pt arrived to room

## 2014-12-21 NOTE — ED Provider Notes (Signed)
CSN: [id number]     Arrival date & time 12/21/14  1004 History   First MD Initiated Contact with Patient 12/21/14 1141     Chief Complaint  Patient presents with  . Chest Pain     (Consider location/radiation/quality/duration/timing/severity/associated sxs/prior Treatment) Patient is a 49 y.o. male presenting with chest pain. The history is provided by the patient.  Chest Pain Associated symptoms: shortness of breath   Associated symptoms: no abdominal pain, no back pain, no fever, no headache, no nausea and not vomiting    patient with complaint of left-sided chest pain since Monday has been constant currently 10 out of 10 radiates to left arm left neck. Patient followed by wellness clinic also followed by pulmonary medicine. Patient has a history of pulmonary embolus is on blood thinners. Patient has had chest pain in the past without any specific findings. Patient also had admission for the chest pain in February. Patient recently had CT of his chest and may without any acute findings. Patient does use albuterol. Chest pain going on this week associated with shortness of breath no nausea no vomiting no diaphoresis.  Past Medical History  Diagnosis Date  . COPD (chronic obstructive pulmonary disease)   . Hypertension     a. Dx 6 yrs ago - untreated.  . Tobacco abuse     a. 30 pack years.  Marland Kitchen ETOH abuse     a. 06/2014 currently a few bottles of wine/week.  . Drug abuse     a. 06/2014 Cocaine/Marijuana - last used a few months ago.  . Congenital heart disease     a. thinks he had "a hole in my heart" s/p surgical correction @ age 4 and then age 3.  . Chest pain     a. 2010 s/p cath in [place], [place] - reportedly nl.  . Pulmonary embolism    Past Surgical History  Procedure Laterality Date  . Cardiac surgery      congenital    Family History  Problem Relation Age of Onset  . Hypertension Mother     alive  . Other Father     died in late 64's - ? cause  . Hypertension Maternal  Uncle   . Heart disease Maternal Uncle    Social History  Substance Use Topics  . Smoking status: Former Smoker -- 0.25 packs/day for 30 years    Types: Cigarettes    Quit date: 06/12/2014  . Smokeless tobacco: Never Used  . Alcohol Use: No     Comment: Previously drank heavily on a daily basis.  Now drinks a few bottles of wine 1-2 x /wk (06/2014)    Review of Systems  Constitutional: Negative for fever.  HENT: Negative for congestion.   Eyes: Negative for visual disturbance.  Respiratory: Positive for shortness of breath.   Cardiovascular: Positive for chest pain.  Gastrointestinal: Negative for nausea, vomiting and abdominal pain.  Genitourinary: Negative for dysuria and hematuria.  Musculoskeletal: Negative for back pain.  Skin: Negative for rash.  Neurological: Negative for headaches.  Hematological: Bruises/bleeds easily.  Psychiatric/Behavioral: Negative for confusion.      Allergies  Morphine and related; Dilaudid; and Naprosyn  Home Medications   Prior to Admission medications   Medication Sig Start Date End Date Taking? Authorizing Provider  albuterol (PROVENTIL HFA;VENTOLIN HFA) 108 (90 BASE) MCG/ACT inhaler Inhale 2 puffs into the lungs every 6 (six) hours as needed for wheezing or shortness of breath. 08/14/14  Yes Lorayne Marek, MD  cloNIDine (  CATAPRES) 0.1 MG tablet One three times daily 07/20/14  Yes Tanda Rockers, MD  gabapentin (NEURONTIN) 400 MG capsule Take 1 capsule (400 mg total) by mouth 3 (three) times daily. 09/20/14  Yes Lorayne Marek, MD  rivaroxaban (XARELTO) 20 MG TABS tablet Take 1 tablet (20 mg total) by mouth daily with supper. Start AFTER finished with 15mg  pills 08/14/14  Yes Deepak Advani, MD  diazepam (VALIUM) 2 MG tablet Take 1 tablet (2 mg total) by mouth every 12 (twelve) hours as needed for muscle spasms. Patient not taking: Reported on 12/04/2014 07/25/14   Everlene Balls, MD  HYDROcodone-acetaminophen (NORCO/VICODIN) 5-325 MG per tablet Take  1-2 tablets by mouth every 4 (four) hours as needed for moderate pain. Patient not taking: Reported on 10/12/2014 07/04/14   Orson Eva, MD  HYDROcodone-acetaminophen (NORCO/VICODIN) 5-325 MG per tablet Take 1-2 tablets by mouth every 6 (six) hours as needed for moderate pain. 12/21/14   Fredia Sorrow, MD  hydrocortisone cream 1 % Apply 1 application topically 2 (two) times daily. Patient not taking: Reported on 12/21/2014 09/20/14   Lorayne Marek, MD  methocarbamol (ROBAXIN) 500 MG tablet Take 1 tablet (500 mg total) by mouth 2 (two) times daily. Patient not taking: Reported on 12/04/2014 09/20/14   Lorayne Marek, MD  oxyCODONE-acetaminophen (PERCOCET/ROXICET) 5-325 MG per tablet Take 1 tablet by mouth 2 (two) times daily as needed for severe pain. Patient not taking: Reported on 10/12/2014 07/25/14   Everlene Balls, MD  traMADol (ULTRAM) 50 MG tablet Take 1 tablet (50 mg total) by mouth every 6 (six) hours as needed. Patient not taking: Reported on 12/21/2014 07/31/14   Noland Fordyce, PA-C  Vitamin D, Ergocalciferol, (DRISDOL) 50000 UNITS CAPS capsule Take 1 capsule (50,000 Units total) by mouth every 7 (seven) days. Patient not taking: Reported on 12/04/2014 07/06/14   Lorayne Marek, MD   BP 112/63 mmHg  Pulse 50  Temp(Src) 97.6 F (36.4 C) (Oral)  Resp 13  SpO2 96% Physical Exam  Constitutional: He is oriented to person, place, and time. He appears well-developed and well-nourished. No distress.  HENT:  Head: Normocephalic and atraumatic.  Eyes: Conjunctivae and EOM are normal. Pupils are equal, round, and reactive to light.  Neck: Normal range of motion. Neck supple.  Cardiovascular: Normal rate, regular rhythm and normal heart sounds.   No murmur heard. Pulmonary/Chest: Effort normal and breath sounds normal. No respiratory distress. He exhibits no tenderness.  Abdominal: Soft. Bowel sounds are normal. There is no tenderness.  Musculoskeletal: Normal range of motion. He exhibits no edema.   Neurological: He is alert and oriented to person, place, and time. No cranial nerve deficit. He exhibits normal muscle tone. Coordination normal.  Skin: Skin is warm. No rash noted.  Nursing note and vitals reviewed.   ED Course  Procedures (including critical care time) Labs Review Labs Reviewed  BASIC METABOLIC PANEL - Abnormal; Notable for the following:    Glucose, Bld 100 (*)    All other components within normal limits  CBC WITH DIFFERENTIAL/PLATELET  BRAIN NATRIURETIC PEPTIDE  I-STAT TROPOININ, ED   Results for orders placed or performed during the hospital encounter of 52/77/82  Basic metabolic panel  Result Value Ref Range   Sodium 139 135 - 145 mmol/L   Potassium 3.7 3.5 - 5.1 mmol/L   Chloride 107 101 - 111 mmol/L   CO2 25 22 - 32 mmol/L   Glucose, Bld 100 (H) 65 - 99 mg/dL   BUN 6 6 - 20  mg/dL   Creatinine, Ser 1.02 0.61 - 1.24 mg/dL   Calcium 9.3 8.9 - 10.3 mg/dL   GFR calc non Af Amer >60 >60 mL/min   GFR calc Af Amer >60 >60 mL/min   Anion gap 7 5 - 15  CBC with Differential  Result Value Ref Range   WBC 4.8 4.0 - 10.5 K/uL   RBC 4.72 4.22 - 5.81 MIL/uL   Hemoglobin 14.8 13.0 - 17.0 g/dL   HCT 43.7 39.0 - 52.0 %   MCV 92.6 78.0 - 100.0 fL   MCH 31.4 26.0 - 34.0 pg   MCHC 33.9 30.0 - 36.0 g/dL   RDW 13.6 11.5 - 15.5 %   Platelets 268 150 - 400 K/uL   Neutrophils Relative % 56 43 - 77 %   Neutro Abs 2.7 1.7 - 7.7 K/uL   Lymphocytes Relative 31 12 - 46 %   Lymphs Abs 1.5 0.7 - 4.0 K/uL   Monocytes Relative 11 3 - 12 %   Monocytes Absolute 0.5 0.1 - 1.0 K/uL   Eosinophils Relative 2 0 - 5 %   Eosinophils Absolute 0.1 0.0 - 0.7 K/uL   Basophils Relative 0 0 - 1 %   Basophils Absolute 0.0 0.0 - 0.1 K/uL  Brain natriuretic peptide  Result Value Ref Range   B Natriuretic Peptide 65.3 0.0 - 100.0 pg/mL  I-stat troponin, ED  Result Value Ref Range   Troponin i, poc 0.00 0.00 - 0.08 ng/mL   Comment 3             Imaging Review Dg Chest 2  View  12/21/2014   CLINICAL DATA:  Left-sided chest pain and shortness of breath for 6 months. Former smoker.  EXAM: CHEST  2 VIEW  COMPARISON:  10/12/2014  FINDINGS: Sequelae of prior median sternotomy are again identified. The cardiac silhouette remains upper limits of normal in size, unchanged. Coarsening of the interstitial markings is slightly less prominent than on the prior study and may reflect underlying COPD. Areas of scarring are again seen in the lung apices and lung bases, with a slightly more nodular focus of opacity in the lateral right upper lobe as seen on prior CT. There is no evidence of acute airspace consolidation, overt pulmonary edema, pleural effusion, or pneumothorax. Mild thoracic levoscoliosis is noted.  IMPRESSION: COPD and scarring.  No definite evidence of acute airspace disease.   Electronically Signed   By: Logan Bores   On: 12/21/2014 10:52   Ct Angio Chest Pe W/cm &/or Wo Cm  12/21/2014   CLINICAL DATA:  Left-sided chest pain radiating into the left upper extremity associated with shortness of breath, acute onset. Prior right lower lobe pulmonary emboli diagnosed in February, 2016. Prior history of congenital heart disease with surgical correction as a child.  EXAM: CT ANGIOGRAPHY CHEST WITH CONTRAST  TECHNIQUE: Multidetector CT imaging of the chest was performed using the standard protocol during bolus administration of intravenous contrast. Multiplanar CT image reconstructions and MIPs were obtained to evaluate the vascular anatomy.  CONTRAST:  33mL OMNIPAQUE IOHEXOL 350 MG/ML IV.  COMPARISON:  CTA chest 10/02/2014, 07/20/2014, 07/02/2014.  FINDINGS: Contrast opacification of the pulmonary artery is is good. Respiratory motion blurred many of the images of the mid and lower lungs. Overall, the study is of moderate diagnostic quality.  Interval partial recanalization of the right lower lobe pulmonary artery and its branches which were involved by the prior emboli, though  chronic filling defects persist. Interval recanalization  of the solitary segmental left lower lobe pulmonary artery which was involved by a prior embolus. No new filling defects within either main pulmonary artery or their branches in either lung to suggest acute pulmonary emboli.  Marked enlargement of the right ventricle with evidence of marked right ventricular hypertrophy, unchanged. Marked dilation of the anterior and posterior leaflets of the pulmonic valve and mild dilation of the medial leaflet, unchanged. No evidence of dilation of the pulmonary arteries. Marked enlargement of the right atrium with reflux of contrast into the IVC and hepatic veins as noted previously. Moderate to marked left ventricular hypertrophy, unchanged. No visible atherosclerosis involving the thoracic or upper abdominal aorta or their visualized branches.  Emphysematous changes throughout both lungs with peripheral blebs in the apices as noted previously. Scattered areas of hyperlucency throughout both lungs. Stable scarring in the deep posterior right lower lobe. No confluent airspace consolidation. No evidence of interstitial lung disease. No pleural effusions. Central airways patent with mild bronchial wall thickening, unchanged.  No significant mediastinal, hilar or axillary lymphadenopathy. Visualized thyroid gland unremarkable.  Visualized upper abdomen unremarkable. Bone window images unremarkable. Prior sternotomy.  Review of the MIP images confirms the above findings.  IMPRESSION: 1. No evidence of acute pulmonary emboli. 2. Interval partial recanalization of the right lower lobe pulmonary artery and the multiple segmental branches which were involved by the prior emboli. Chronic filling defects persist, however. 3. COPD/emphysema. Scattered areas of hyperlucency in both lungs consistent with localized air trapping mass can be seen in patients with asthma. No acute cardiopulmonary disease otherwise. 4. Marked right  ventricular enlargement and right ventricular hypertrophy, moderate to marked left ventricular hypertrophy, marked right atrial enlargement, and marked dilation of the pulmonic valve leaflets, unchanged since the prior examinations.   Electronically Signed   By: Evangeline Dakin M.D.   On: 12/21/2014 15:45   I, Jaydien Panepinto, personally reviewed and evaluated these images and lab results as part of my medical decision-making.   EKG Interpretation   Date/Time:  Thursday December 21 2014 10:11:00 EDT Ventricular Rate:  51 PR Interval:  218 QRS Duration: 154 QT Interval:  456 QTC Calculation: 420 R Axis:   -75 Text Interpretation:  Sinus bradycardia with 1st degree A-V block Possible  Left atrial enlargement Left axis deviation Right bundle branch block  Anterior infarct , age undetermined Abnormal ECG Confirmed by Agape Hardiman   MD, Myrl Lazarus [phone]) on 12/21/2014 1:42:28 PM      MDM   Final diagnoses:  Chest pain, unspecified chest pain type    Patient with a long-standing history of chest pain. Today pain predominantly left-sided. The onset of the pain this time was on Monday associated with shortness of breath. Pains been constant does go to left arm and left neck pain was 10 out of 10. Patient also has a history of pulmonary embolus and is on blood thinners for that. Patient is on [place].  Workup today troponin was negative so not consistent with acute cardiac event. EKG without any acute changes. Chest x-ray showed evidence consistent with COPD but no other findings. Also did CT angiogram to rule out the possibility of recurrent pulmonary embolus. No evidence of any acute embolus also significant resolution in the previous right lower lobe embolus. Reviewed pulmonary's notes. Have referred patient to cardiology for further evaluation of the chest pain.  Patient does have albuterol inhaler available to use at home and recommended to continue using that. For the COPD and/or asthma  possibilities as a cause of  the discomfort.  Fredia Sorrow, MD 12/21/14 [phone]

## 2014-12-21 NOTE — ED Notes (Addendum)
Pt c/o left sided CP with radiation to arm and SOB; pt sts hx of PE and open heart sx when he was young x 2; pt sts pain x 3 days

## 2014-12-21 NOTE — Discharge Instructions (Signed)
Workup here today show significant improvement from the previous pulmonary embolus and no evidence of any new blood clot in the lungs. Also no evidence of any acute cardiac event. Follow-up with your pulmonologist as scheduled referral to cardiology provided: Make an appointment. Return for any new or worse symptoms. Based on your past history continue usual albuterol inhaler may be helpful for the current symptoms.

## 2014-12-21 NOTE — ED Notes (Signed)
Pt and family verbally upset that no provider has seen pt yet. Pt sts that he needs something for pain. Pt family member sts to Network engineer "I am going to raise hell if someone don't get in here and give him something for pain." EDP [place] aware.

## 2015-01-04 ENCOUNTER — Other Ambulatory Visit: Payer: Self-pay

## 2015-01-04 ENCOUNTER — Ambulatory Visit (HOSPITAL_COMMUNITY)
Admission: RE | Admit: 2015-01-04 | Discharge: 2015-01-04 | Disposition: A | Discharge status: Home / Self Care | Payer: Self-pay | Source: Ambulatory Visit | Attending: Cardiology | Admitting: Cardiology

## 2015-01-04 ENCOUNTER — Ambulatory Visit: Payer: Self-pay | Attending: Family Medicine | Admitting: Physician Assistant

## 2015-01-04 ENCOUNTER — Encounter: Payer: Self-pay | Admitting: Physician Assistant

## 2015-01-04 VITALS — BP 108/52 | HR 51 | Temp 98.7°F | Resp 21 | Ht 71.0 in | Wt 176.8 lb

## 2015-01-04 DIAGNOSIS — J449 Chronic obstructive pulmonary disease, unspecified: Secondary | ICD-10-CM | POA: Insufficient documentation

## 2015-01-04 DIAGNOSIS — T300 Burn of unspecified body region, unspecified degree: Secondary | ICD-10-CM

## 2015-01-04 DIAGNOSIS — I2699 Other pulmonary embolism without acute cor pulmonale: Secondary | ICD-10-CM

## 2015-01-04 DIAGNOSIS — Z7901 Long term (current) use of anticoagulants: Secondary | ICD-10-CM | POA: Insufficient documentation

## 2015-01-04 DIAGNOSIS — M545 Low back pain: Secondary | ICD-10-CM | POA: Insufficient documentation

## 2015-01-04 DIAGNOSIS — Z79899 Other long term (current) drug therapy: Secondary | ICD-10-CM | POA: Insufficient documentation

## 2015-01-04 DIAGNOSIS — G8929 Other chronic pain: Secondary | ICD-10-CM | POA: Insufficient documentation

## 2015-01-04 DIAGNOSIS — T25121A Burn of first degree of right foot, initial encounter: Secondary | ICD-10-CM | POA: Insufficient documentation

## 2015-01-04 DIAGNOSIS — X12XXXA Contact with other hot fluids, initial encounter: Secondary | ICD-10-CM | POA: Insufficient documentation

## 2015-01-04 DIAGNOSIS — R079 Chest pain, unspecified: Secondary | ICD-10-CM | POA: Insufficient documentation

## 2015-01-04 DIAGNOSIS — Z86711 Personal history of pulmonary embolism: Secondary | ICD-10-CM | POA: Insufficient documentation

## 2015-01-04 MED ORDER — RIVAROXABAN 20 MG PO TABS: 20.0000 mg | ORAL_TABLET | Freq: Every day | ORAL | Status: DC

## 2015-01-04 MED ORDER — SILVER SULFADIAZINE 1 % EX CREA: 1.0000 "application " | TOPICAL_CREAM | Freq: Every day | CUTANEOUS | Status: DC

## 2015-01-04 MED ORDER — KETOROLAC TROMETHAMINE 30 MG/ML IJ SOLN
30.0000 mg | Freq: Once | INTRAMUSCULAR | Status: AC
Start: 2015-01-04 — End: 2015-01-04
  Administered 2015-01-04: 30 mg via INTRAMUSCULAR

## 2015-01-04 MED ORDER — KETOROLAC TROMETHAMINE 30 MG/ML IM SOLN: 30.0000 mg | Freq: Once | INTRAMUSCULAR | Status: DC

## 2015-01-04 NOTE — Progress Notes (Addendum)
Patient presents c/o sharp left-sided chest pain with "painful numbness" down left arm over last hour; rates 8/10 at present ECG done and provider in room  Filed Vitals:   01/04/15 1203  BP: 108/52  Pulse: 51  Temp: 98.7 F (37.1 C)  Resp: 21    Patient given toradol as directed  Referred to LCSW for PHQ-9 score of 20  Rates chest pain at 2/10 30 minutes after toradol admin. Patient discharged to home in stable condition accompanied by sister

## 2015-01-04 NOTE — Addendum Note (Signed)
Addended by: Velora Heckler on: 01/04/2015 12:46 PM   Modules accepted: Orders, Medications

## 2015-01-04 NOTE — Addendum Note (Signed)
Addended byBrayton Caves on: 01/04/2015 01:25 PM   Modules accepted: Orders

## 2015-01-04 NOTE — Progress Notes (Signed)
Chief Complaint: Chest pain  Subjective: This is a 49 year old male with chronic low back pain, chronic chest pain, COPD and a history of pulmonary embolism who is being seen in follow-up from a recent emergency department visit for chest pain. He has actually had multiple emergency department visits this year for chest pain or shortness of breath. His most recent was August 11. At that time cardiac enzymes, EKG, CTPA were all within normal limits. He was told to continue using his albuterol inhaler. His EKG showed some COPD.  Since that time is continue with this chest pain that he described rather atypically. His left sided and sharp. It last for a few minutes at a time. It makes him short of breath. Occasionally it'll radiate down his left arm. The discomfort is all day every day. He has no associated nausea or vomiting. He has no associated diaphoresis. He is presently not working.  He also states that he was boiling some water and accidentally dropped some of the water on his dorsal aspect of the right foot forming a blister that has now burst. He wants me take a look at it.   ROS:  GEN: denies fever or chills, denies change in weight Skin: + lesions or rashes LUNGS: + SHOB, dyspnea, PND, orthopnea CV: + CP or palpitations EXT:+pain dorsum right foot    Objective:  Filed Vitals:   01/04/15 1203  BP: 108/52  Pulse: 48  Temp: 98.7 F (37.1 C)    Physical Exam:  General: in no acute distress. Heart: Normal  s1 &s2  Regular rate and rhythm, without murmurs, rubs, gallops. Lungs: Clear to auscultation bilaterally. Extremities: No clubbing cyanosis or edema with positive pedal pulses. Neuro: Alert, awake, oriented x3, nonfocal.  Pertinent Lab Results:EKG-NSR RBBB; J point elevation V2 and V3   Medications: Prior to Admission medications   Medication Sig Start Date End Date Taking? Authorizing Provider  albuterol (PROVENTIL HFA;VENTOLIN HFA) 108 (90 BASE) MCG/ACT inhaler  Inhale 2 puffs into the lungs every 6 (six) hours as needed for wheezing or shortness of breath. 08/14/14   Lorayne Marek, MD  cloNIDine (CATAPRES) 0.1 MG tablet One three times daily 07/20/14   Tanda Rockers, MD  diazepam (VALIUM) 2 MG tablet Take 1 tablet (2 mg total) by mouth every 12 (twelve) hours as needed for muscle spasms. Patient not taking: Reported on 12/04/2014 07/25/14   Everlene Balls, MD  gabapentin (NEURONTIN) 400 MG capsule Take 1 capsule (400 mg total) by mouth 3 (three) times daily. 09/20/14   Lorayne Marek, MD  HYDROcodone-acetaminophen (NORCO/VICODIN) 5-325 MG per tablet Take 1-2 tablets by mouth every 4 (four) hours as needed for moderate pain. Patient not taking: Reported on 10/12/2014 07/04/14   Orson Eva, MD  HYDROcodone-acetaminophen (NORCO/VICODIN) 5-325 MG per tablet Take 1-2 tablets by mouth every 6 (six) hours as needed for moderate pain. 12/21/14   Fredia Sorrow, MD  hydrocortisone cream 1 % Apply 1 application topically 2 (two) times daily. Patient not taking: Reported on 12/21/2014 09/20/14   Lorayne Marek, MD  ketorolac (TORADOL) 30 MG/ML injection Inject 1 mL (30 mg total) into the muscle once. 01/04/15   Brayton Caves, PA-C  methocarbamol (ROBAXIN) 500 MG tablet Take 1 tablet (500 mg total) by mouth 2 (two) times daily. Patient not taking: Reported on 12/04/2014 09/20/14   Lorayne Marek, MD  oxyCODONE-acetaminophen (PERCOCET/ROXICET) 5-325 MG per tablet Take 1 tablet by mouth 2 (two) times daily as needed for severe pain. Patient not taking:  Reported on 10/12/2014 07/25/14   Everlene Balls, MD  rivaroxaban (XARELTO) 20 MG TABS tablet Take 1 tablet (20 mg total) by mouth daily with supper. Start AFTER finished with 15mg  pills 08/14/14   Lorayne Marek, MD  silver sulfADIAZINE (SILVADENE) 1 % cream Apply 1 application topically daily. 01/04/15   Brayton Caves, PA-C  traMADol (ULTRAM) 50 MG tablet Take 1 tablet (50 mg total) by mouth every 6 (six) hours as needed. Patient not taking:  Reported on 12/21/2014 07/31/14   Noland Fordyce, PA-C  Vitamin D, Ergocalciferol, (DRISDOL) 50000 UNITS CAPS capsule Take 1 capsule (50,000 Units total) by mouth every 7 (seven) days. Patient not taking: Reported on 12/04/2014 07/06/14   Lorayne Marek, MD    Assessment: 1. Atypical CP 2. 1st degree burn  Plan: Toradol today Antiinflammatories TID with meals for 1 weeks Keep appt with Cards as scheduled  Silvadene/bacitracin prn  Follow up:2 weeks  The patient was given clear instructions to go to ER or return to medical center if symptoms don't improve, worsen or new problems develop. The patient verbalized understanding. The patient was told to call to get lab results if they haven't heard anything in the next week.   This note has been created with Surveyor, quantity. Any transcriptional errors are unintentional.   Zettie Pho, PA-C 01/04/2015, 12:08 PM

## 2015-01-05 ENCOUNTER — Encounter (HOSPITAL_BASED_OUTPATIENT_CLINIC_OR_DEPARTMENT_OTHER): Payer: Self-pay | Admitting: Clinical

## 2015-01-05 DIAGNOSIS — F418 Other specified anxiety disorders: Secondary | ICD-10-CM

## 2015-01-05 DIAGNOSIS — F329 Major depressive disorder, single episode, unspecified: Secondary | ICD-10-CM

## 2015-01-05 DIAGNOSIS — F419 Anxiety disorder, unspecified: Principal | ICD-10-CM

## 2015-01-05 NOTE — Progress Notes (Signed)
ASSESSMENT: Pt currently experiencing symptoms of anxiety and depression. Pt needs to f/u with PCP and [facility]; would benefit from psychoeducation and supportive counseling regarding coping with symptoms of anxiety and depression.  Stage of Change: contemplative  PLAN: 1. F/U with behavioral health consultant in 3 weeks 2. Psychiatric Medications: Neurontin. 3. Behavioral recommendation(s):   -Consider reading educational material regarding coping with symptoms of anxiety and depression -Consider applying for orange card SUBJECTIVE: Pt. referred by Dr Adrian Blackwater for symptoms of anxiety and depression:  Pt. reports the following symptoms/concerns: Pt states that he started feeling depressed about 9 months ago; distressed that he is unable to work, and is in constant pain, has trouble sleeping, has filed for disability.  Duration of problem: 9 months Severity: severe  OBJECTIVE: Orientation & Cognition: Oriented x3. Thought processes normal and appropriate to situation. Mood: appropriate. Affect: appropriate Appearance: appropriate Risk of harm to self or others: no risk of harm to self or others Substance use: none current Assessments administered: PHQ9: 20/ GAD7: 20  Diagnosis: Anxiety and depression CPT Code: F41.8 -------------------------------------------- Other(s) present in the room: mother  Time spent with patient in exam room: 20 minutes

## 2015-01-09 ENCOUNTER — Encounter: Payer: Self-pay | Admitting: Physician Assistant

## 2015-01-09 NOTE — Telephone Encounter (Signed)
He only said he needed to be referred to a cardiologist.  He did not specify why

## 2015-01-10 ENCOUNTER — Ambulatory Visit (INDEPENDENT_AMBULATORY_CARE_PROVIDER_SITE_OTHER): Payer: Self-pay | Admitting: Physician Assistant

## 2015-01-10 ENCOUNTER — Encounter: Payer: Self-pay | Admitting: Physician Assistant

## 2015-01-10 DIAGNOSIS — R079 Chest pain, unspecified: Secondary | ICD-10-CM

## 2015-01-10 NOTE — Progress Notes (Signed)
No show

## 2015-01-11 DIAGNOSIS — Z86711 Personal history of pulmonary embolism: Secondary | ICD-10-CM | POA: Insufficient documentation

## 2015-01-16 ENCOUNTER — Encounter (HOSPITAL_BASED_OUTPATIENT_CLINIC_OR_DEPARTMENT_OTHER): Payer: Self-pay | Admitting: Clinical

## 2015-01-16 ENCOUNTER — Encounter: Payer: Self-pay | Admitting: Family Medicine

## 2015-01-16 ENCOUNTER — Other Ambulatory Visit: Payer: Self-pay

## 2015-01-16 ENCOUNTER — Ambulatory Visit: Payer: Self-pay | Attending: Family Medicine | Admitting: Family Medicine

## 2015-01-16 ENCOUNTER — Ambulatory Visit (HOSPITAL_COMMUNITY)
Admission: RE | Admit: 2015-01-16 | Discharge: 2015-01-16 | Disposition: A | Discharge status: Home / Self Care | Payer: Self-pay | Source: Ambulatory Visit | Attending: Cardiology | Admitting: Cardiology

## 2015-01-16 VITALS — BP 147/88 | HR 78 | Temp 98.1°F | Ht 71.0 in | Wt 178.0 lb

## 2015-01-16 DIAGNOSIS — R079 Chest pain, unspecified: Secondary | ICD-10-CM | POA: Insufficient documentation

## 2015-01-16 DIAGNOSIS — Z87891 Personal history of nicotine dependence: Secondary | ICD-10-CM | POA: Insufficient documentation

## 2015-01-16 DIAGNOSIS — F418 Other specified anxiety disorders: Secondary | ICD-10-CM

## 2015-01-16 DIAGNOSIS — I4510 Unspecified right bundle-branch block: Secondary | ICD-10-CM | POA: Insufficient documentation

## 2015-01-16 DIAGNOSIS — F419 Anxiety disorder, unspecified: Principal | ICD-10-CM

## 2015-01-16 DIAGNOSIS — M545 Low back pain: Secondary | ICD-10-CM

## 2015-01-16 DIAGNOSIS — F329 Major depressive disorder, single episode, unspecified: Secondary | ICD-10-CM

## 2015-01-16 DIAGNOSIS — G8929 Other chronic pain: Secondary | ICD-10-CM

## 2015-01-16 DIAGNOSIS — I2699 Other pulmonary embolism without acute cor pulmonale: Secondary | ICD-10-CM

## 2015-01-16 DIAGNOSIS — J438 Other emphysema: Secondary | ICD-10-CM

## 2015-01-16 DIAGNOSIS — I10 Essential (primary) hypertension: Secondary | ICD-10-CM

## 2015-01-16 MED ORDER — TRAMADOL HCL 50 MG PO TABS
50.0000 mg | ORAL_TABLET | Freq: Three times a day (TID) | ORAL | Status: DC | PRN
Start: 2015-01-16 — End: 2015-01-26

## 2015-01-16 MED ORDER — METHOCARBAMOL 500 MG PO TABS: 500.0000 mg | ORAL_TABLET | Freq: Two times a day (BID) | ORAL | Status: DC

## 2015-01-16 MED ORDER — ALBUTEROL SULFATE HFA 108 (90 BASE) MCG/ACT IN AERS: 2.0000 | INHALATION_SPRAY | Freq: Four times a day (QID) | RESPIRATORY_TRACT | Status: DC | PRN

## 2015-01-16 MED ORDER — NITROGLYCERIN 0.4 MG SL SUBL: 0.4000 mg | SUBLINGUAL_TABLET | SUBLINGUAL | Status: DC | PRN

## 2015-01-16 MED ORDER — RIVAROXABAN 20 MG PO TABS: 20.0000 mg | ORAL_TABLET | Freq: Every day | ORAL | Status: DC

## 2015-01-16 MED ORDER — GABAPENTIN 400 MG PO CAPS: 400.0000 mg | ORAL_CAPSULE | Freq: Three times a day (TID) | ORAL | Status: DC

## 2015-01-16 NOTE — Progress Notes (Signed)
Subjective:  Patient ID: Jeremy Booker, male    DOB: 1965/08/17  Age: 49 y.o. MRN: [id number]  CC: Chest Pain   HPI Jeremy Booker presents for a hospital follow up for chest pain. Medical history is notable for a history of congenital heart defect, history of CABG, pulmonary embolism (currently on anticoagulation with Xarelto), emphysema, hypertension, chronic low back pain, recurrent chest pain. He was recently hospitalized at [facility] from 01/11/15 to 01/12/15 after he had presented with chest pains (Reviewed records from care everywhere). He underwent a myocardial perfusion study which revealed low risk stress test. His chest x-ray was unremarkable; he did have hypokalemia which was replaced and his chest pain was thought to be noncardiac in nature and he was advised to follow-up with cardiology. He was seen at [facility] ED for similar symptoms and CT angiogram ruled out the possibility of a PE. He was referred to cardiology and had an appointment for 01/10/15 which he missed due to transportation issues he says.  Chest pains are sharp and have been on since February and occur on the left chest wall 4-5/10 in severity and comes in waves and at other times he feels like something stuck in his throat; he does have some dyspnea on exertion. He was by [facility] as well and had spirometry done (no obstruction, poor effort, no evidence of asthma),  correlation with CT findings and no further pulmonary work up   Outpatient Prescriptions Prior to Visit  Medication Sig Dispense Refill  . cloNIDine (CATAPRES) 0.1 MG tablet One three times daily 90 tablet 11  . albuterol (PROVENTIL HFA;VENTOLIN HFA) 108 (90 BASE) MCG/ACT inhaler Inhale 2 puffs into the lungs every 6 (six) hours as needed for wheezing or shortness of breath. 3 Inhaler 3  . gabapentin (NEURONTIN) 400 MG capsule Take 1 capsule (400 mg total) by mouth 3 (three) times daily. 90 capsule 3  . rivaroxaban (XARELTO) 20 MG  TABS tablet Take 1 tablet (20 mg total) by mouth daily with supper. Start AFTER finished with 15mg  pills 90 tablet 3  . diazepam (VALIUM) 2 MG tablet Take 1 tablet (2 mg total) by mouth every 12 (twelve) hours as needed for muscle spasms. (Patient not taking: Reported on 12/04/2014) 10 tablet 0  . HYDROcodone-acetaminophen (NORCO/VICODIN) 5-325 MG per tablet Take 1-2 tablets by mouth every 4 (four) hours as needed for moderate pain. (Patient not taking: Reported on 10/12/2014) 30 tablet 0  . HYDROcodone-acetaminophen (NORCO/VICODIN) 5-325 MG per tablet Take 1-2 tablets by mouth every 6 (six) hours as needed for moderate pain. (Patient not taking: Reported on 01/16/2015) 10 tablet 0  . hydrocortisone cream 1 % Apply 1 application topically 2 (two) times daily. (Patient not taking: Reported on 12/21/2014) 30 g 1  . methocarbamol (ROBAXIN) 500 MG tablet Take 1 tablet (500 mg total) by mouth 2 (two) times daily. (Patient not taking: Reported on 12/04/2014) 20 tablet 0  . oxyCODONE-acetaminophen (PERCOCET/ROXICET) 5-325 MG per tablet Take 1 tablet by mouth 2 (two) times daily as needed for severe pain. (Patient not taking: Reported on 10/12/2014) 10 tablet 0  . silver sulfADIAZINE (SILVADENE) 1 % cream Apply 1 application topically daily. 50 g 0  . traMADol (ULTRAM) 50 MG tablet Take 1 tablet (50 mg total) by mouth every 6 (six) hours as needed. (Patient not taking: Reported on 12/21/2014) 15 tablet 0  . Vitamin D, Ergocalciferol, (DRISDOL) 50000 UNITS CAPS capsule Take 1 capsule (50,000 Units total) by  mouth every 7 (seven) days. (Patient not taking: Reported on 12/04/2014) 12 capsule 0   No facility-administered medications prior to visit.    ROS Review of Systems  Constitutional: Negative for activity change and appetite change.  HENT: Negative for sinus pressure and sore throat.   Eyes: Negative for visual disturbance.  Respiratory: Positive for shortness of breath. Negative for cough and chest tightness.     Cardiovascular: Positive for chest pain. Negative for leg swelling.  Gastrointestinal: Negative for abdominal pain, diarrhea, constipation and abdominal distention.  Endocrine: Negative.   Genitourinary: Negative for dysuria.  Musculoskeletal: Positive for back pain. Negative for myalgias and joint swelling.  Skin: Negative for rash.  Allergic/Immunologic: Negative.   Neurological: Negative for weakness, light-headedness and numbness.  Psychiatric/Behavioral: Negative for suicidal ideas and dysphoric mood.       Objective:  BP 147/88 mmHg  Pulse 78  Temp(Src) 98.1 F (36.7 C)  Ht 5\' 11"  (1.803 m)  Wt 178 lb (80.74 kg)  BMI 24.84 kg/m2  SpO2 96%  BP/Weight 01/16/2015 01/04/2015 1/69/6789  Systolic BP 381 017 510  Diastolic BP 88 52 61  Wt. (Lbs) 178 176.8 -  BMI 24.84 24.67 -    Lab Results  Component Value Date   WBC 4.8 12/21/2014   HGB 14.8 12/21/2014   HCT 43.7 12/21/2014   PLT 268 12/21/2014   GLUCOSE 100* 12/21/2014   CHOL 192 07/03/2014   TRIG 149 07/03/2014   HDL 51 07/03/2014   LDLCALC 111* 07/03/2014   ALT 37 07/05/2014   AST 43* 07/05/2014   NA 139 12/21/2014   K 3.7 12/21/2014   CL 107 12/21/2014   CREATININE 1.02 12/21/2014   BUN 6 12/21/2014   CO2 25 12/21/2014   TSH 0.636 07/05/2014   HGBA1C 5.3 07/05/2014     Physical Exam  Constitutional: He is oriented to person, place, and time. He appears well-developed and well-nourished.  Cardiovascular: Normal rate and intact distal pulses.   Murmur (3/6 systolic) heard. Pulmonary/Chest: Effort normal and breath sounds normal. He has no wheezes. He has no rales. He exhibits tenderness (on palpation of left precordium).  Vertical sternotomy scar  Abdominal: Soft. Bowel sounds are normal. He exhibits no distension and no mass. There is no tenderness.  Musculoskeletal: He exhibits tenderness (of lower back which is more pronounced on attempting to lie supine).  Neurological: He is alert and oriented to  person, place, and time.   CLINICAL DATA:Chest pain for the past 1 month. History of  hypertension and pulmonary embolism.    EXAM:  MYOCARDIAL IMAGING WITH SPECT (REST AND PHARMACOLOGIC-STRESS)    GATED LEFT VENTRICULAR WALL MOTION STUDY    LEFT VENTRICULAR EJECTION FRACTION    TECHNIQUE:  Standard myocardial SPECT imaging was performed after resting  intravenous injection of 10.2 mCi Tc-16m sestamibi. Subsequently,  intravenous infusion of Lexiscan was performed under the supervision  of the Cardiology staff. At peak effect of the drug, 31.1 mCi Tc-58m  sestamibi was injected intravenously and standard myocardial SPECT  imaging was performed. Quantitative gated imaging was also performed  to evaluate left ventricular wall motion, and estimate left  ventricular ejection fraction.    COMPARISON:Chest radiograph - 01/11/2015; chest CT - 12/21/2014    FINDINGS:  Raw images: There is mild loss the fact on this in the provided rest  images. There is no significant chest wall attenuation.    Perfusion: The right ventricle appears dilated. No scintigraphic  evidence of prior infarction or pharmacologically  induced ischemia.    Wall Motion: Normal left ventricular wall motion. No left  ventricular dilation.    Left Ventricular Ejection Fraction: 59 %    End diastolic volume 95 ml    End systolic volume 39 ml    IMPRESSION:  1. No scintigraphic evidence of prior infarction or  pharmacologically induced ischemia.    2. Normal left ventricular wall motion.    3. Left ventricular ejection fraction 59%, though there is suspected  right ventricular hypertrophy. Further evaluation cardiac echo could  be performed as clinically indicated.    4. Low-risk stress test findings*.    *2012 Appropriate Use Criteria for Coronary Revascularization  Focused Update: J Am Coll Cardiol. 3267;12(4):580-998.    [email or web address]      Electronically Signed  ByJoaquim Lai M.D.  On: 01/12/2015 13:02   Assessment & Plan:   1. Pulmonary embolus Remains on chronic anticoagulation. - rivaroxaban (XARELTO) 20 MG TABS tablet; Take 1 tablet (20 mg total) by mouth daily with supper.  Dispense: 90 tablet; Refill: 3  2. Chest pain, unspecified chest pain type ED precautions discussed Atypical chest pain; myocardiac perfusion scan from 01/11/15- low risk stress test EKG STAT- NSR, L atrial enlargement. ? Drug seeking behavior - Drug screen panel (serum)  3. Essential hypertension Mildly elevated blood pressure above goal of less than 140/90 Advised on lifestyle changes, low-sodium, DASH diet.  4. Other emphysema No acute exacerbation - albuterol (PROVENTIL HFA;VENTOLIN HFA) 108 (90 BASE) MCG/ACT inhaler; Inhale 2 puffs into the lungs every 6 (six) hours as needed for wheezing or shortness of breath.  Dispense: 3 Inhaler; Refill: 1  5. Chronic lower back pain Muscle relaxant added to regimen and he has been advised against overdosing on medication in detail effects of his actions have been discussed with him. - gabapentin (NEURONTIN) 400 MG capsule; Take 1 capsule (400 mg total) by mouth 3 (three) times daily.  Dispense: 90 capsule; Refill: 1   Meds ordered this encounter  Medications  . rivaroxaban (XARELTO) 20 MG TABS tablet    Sig: Take 1 tablet (20 mg total) by mouth daily with supper.    Dispense:  90 tablet    Refill:  3  . methocarbamol (ROBAXIN) 500 MG tablet    Sig: Take 1 tablet (500 mg total) by mouth 2 (two) times daily.    Dispense:  60 tablet    Refill:  0  . traMADol (ULTRAM) 50 MG tablet    Sig: Take 1 tablet (50 mg total) by mouth every 8 (eight) hours as needed.    Dispense:  30 tablet    Refill:  0  . nitroGLYCERIN (NITROSTAT) 0.4 MG SL tablet    Sig: Place 1 tablet (0.4 mg total) under the tongue every 5 (five)  minutes as needed for chest pain.    Dispense:  50 tablet    Refill:  1  . albuterol (PROVENTIL HFA;VENTOLIN HFA) 108 (90 BASE) MCG/ACT inhaler    Sig: Inhale 2 puffs into the lungs every 6 (six) hours as needed for wheezing or shortness of breath.    Dispense:  3 Inhaler    Refill:  1  . gabapentin (NEURONTIN) 400 MG capsule    Sig: Take 1 capsule (400 mg total) by mouth 3 (three) times daily.    Dispense:  90 capsule    Refill:  1    Follow-up: 1 month to establish with PCP   Arnoldo Morale MD

## 2015-01-16 NOTE — Progress Notes (Signed)
ASSESSMENT: Pt currently experiencing symptoms of anxiety and depression; needs to f/u with PCP and [facility], and would benefit from continued supportive counseling regarding coping with symptoms of anxiety and depression.  Stage of Change: contemplative  PLAN: 1. F/U with behavioral health consultant in one week 2. Psychiatric Medications: Neurontin. 3. Behavioral recommendation(s):   -Call [employer] to set up appointment for non-filing status  SUBJECTIVE: Pt. referred by Dr Jarold Song for symptoms of anxiety and depression:  Pt. reports the following symptoms/concerns: Pt states that he had to wait for hours at the [employer], and still did not get seen to obtain his non-filing status letter; he wants to come back to talk further at next visit, in too much pain today. Duration of problem: at least 9 months Severity: severe  OBJECTIVE: Orientation & Cognition: Oriented x3. Thought processes normal and appropriate to situation. Mood: appropriate. Affect: appropriate Appearance: appropriate Risk of harm to self or others: no risk of harm to self or others Substance use: not assessed today Assessments administered: PHQ9: 17/ GAD7: 18  Diagnosis: Anxiety and depression CPT Code: F41.8 -------------------------------------------- Other(s) present in the room: none  Time spent with patient in exam room: 10 minutes

## 2015-01-16 NOTE — Patient Instructions (Signed)

## 2015-01-16 NOTE — Progress Notes (Signed)
Patient discharged from [facility] on Friday after being admitted for chest pain He states he has had SOB and chest pain since February and rates his pain 4-5/10 on the left side and describes it as sharp in nature He had an appointment with his cardiologist but was unable to go due to lack of transportation He states the MD at the hospital told him his pain was not cardiac in nature but he could not elaborate He needs refills on his gabapentin and his albuterol inhaler He is asking for pain medication for his chest pain and lower back pain

## 2015-01-19 LAB — DRUG SCREEN PANEL (SERUM)

## 2015-01-22 ENCOUNTER — Telehealth: Payer: Self-pay | Admitting: *Deleted

## 2015-01-22 NOTE — Telephone Encounter (Signed)
Left HIPAA compliant message for patient to return RN call at [phone] 

## 2015-01-22 NOTE — Telephone Encounter (Signed)
Letter mailed with negative drug test results

## 2015-01-22 NOTE — Telephone Encounter (Signed)
-----   Message from Arnoldo Morale, MD sent at 01/22/2015  9:39 AM EDT ----- Urine drug screen is negative.

## 2015-01-26 ENCOUNTER — Ambulatory Visit (HOSPITAL_BASED_OUTPATIENT_CLINIC_OR_DEPARTMENT_OTHER): Payer: Self-pay | Admitting: Clinical

## 2015-01-26 ENCOUNTER — Ambulatory Visit: Payer: Self-pay | Attending: Family Medicine | Admitting: Family Medicine

## 2015-01-26 ENCOUNTER — Encounter: Payer: Self-pay | Admitting: Family Medicine

## 2015-01-26 VITALS — BP 137/78 | HR 62 | Temp 97.9°F | Resp 16 | Ht 71.0 in | Wt 175.0 lb

## 2015-01-26 DIAGNOSIS — G8929 Other chronic pain: Secondary | ICD-10-CM | POA: Insufficient documentation

## 2015-01-26 DIAGNOSIS — R0789 Other chest pain: Secondary | ICD-10-CM | POA: Insufficient documentation

## 2015-01-26 DIAGNOSIS — F329 Major depressive disorder, single episode, unspecified: Secondary | ICD-10-CM

## 2015-01-26 DIAGNOSIS — J438 Other emphysema: Secondary | ICD-10-CM

## 2015-01-26 DIAGNOSIS — J449 Chronic obstructive pulmonary disease, unspecified: Secondary | ICD-10-CM | POA: Insufficient documentation

## 2015-01-26 DIAGNOSIS — Z114 Encounter for screening for human immunodeficiency virus [HIV]: Secondary | ICD-10-CM | POA: Insufficient documentation

## 2015-01-26 DIAGNOSIS — M545 Low back pain, unspecified: Secondary | ICD-10-CM | POA: Insufficient documentation

## 2015-01-26 DIAGNOSIS — F32A Depression, unspecified: Secondary | ICD-10-CM

## 2015-01-26 DIAGNOSIS — F419 Anxiety disorder, unspecified: Principal | ICD-10-CM

## 2015-01-26 DIAGNOSIS — F418 Other specified anxiety disorders: Secondary | ICD-10-CM

## 2015-01-26 DIAGNOSIS — K625 Hemorrhage of anus and rectum: Secondary | ICD-10-CM | POA: Insufficient documentation

## 2015-01-26 MED ORDER — BUDESONIDE-FORMOTEROL FUMARATE 80-4.5 MCG/ACT IN AERO: 2.0000 | INHALATION_SPRAY | Freq: Two times a day (BID) | RESPIRATORY_TRACT | Status: DC

## 2015-01-26 MED ORDER — TRAMADOL HCL 50 MG PO TABS: 50.0000 mg | ORAL_TABLET | Freq: Three times a day (TID) | ORAL | Status: DC | PRN

## 2015-01-26 MED ORDER — PANTOPRAZOLE SODIUM 40 MG PO TBEC: 40.0000 mg | DELAYED_RELEASE_TABLET | Freq: Every day | ORAL | Status: DC

## 2015-01-26 NOTE — Patient Instructions (Addendum)
Mr. Jeremy Booker, Jeremy Booker was seen today for follow-up, back pain and rectal bleeding.  Diagnoses and all orders for this visit:  Chronic low back pain -     traMADol (ULTRAM) 50 MG tablet; Take 1 tablet (50 mg total) by mouth every 8 (eight) hours as needed.  Blood per rectum -     CBC  Atypical chest pain -     pantoprazole (PROTONIX) 40 MG tablet; Take 1 tablet (40 mg total) by mouth daily.  Other emphysema -     budesonide-formoterol (SYMBICORT) 80-4.5 MCG/ACT inhaler; Inhale 2 puffs into the lungs 2 (two) times daily.    F/u in 6 weeks for COPD/chronic chest pain  Dr. Adrian Blackwater

## 2015-01-26 NOTE — Progress Notes (Signed)
F/U acid reflex, chest pain and back pain Stated [facility] now  Pain scale # 5 No tobacco user

## 2015-01-26 NOTE — Progress Notes (Signed)
ASSESSMENT: Pt currently experiencing symptoms of anxiety and depression; needs to f/u with PCP and [facility] and would benefit from continued supportive counseling regarding coping with symptoms of anxiety and depression.  Stage of Change: contemplative  PLAN: 1. F/U with behavioral health consultant in as needed 2. Psychiatric Medications: none. 3. Behavioral recommendation(s):   -Consider acceptance in lieu of fighting/fleeing life stressors -Obtain [employer] non-filing letter to bring to financial counseling appointment -call Roselyn Reef at [phone] when orange card is approved  SUBJECTIVE: Pt. referred by self for symptoms of anxiety and depression:  Pt. reports the following symptoms/concerns: Pt says he feels like he's fighting in quicksand at times, like no matter how hard he tries, things are still hard, and he wants to come back and have more time to talk.  Duration of problem: at least 9 months Severity: severe  OBJECTIVE: Orientation & Cognition: Oriented x3. Thought processes normal and appropriate to situation. Mood: mildly low. Affect: appropriate Appearance: appropriate Risk of harm to self or others: no risk of harm to self or others Substance use: will assess at next visit Assessments administered: none  Diagnosis: Anxiety and depression CPT Code: F41.8 -------------------------------------------- Other(s) present in the room: none  Time spent with patient in exam room: 20 minutes

## 2015-01-26 NOTE — Progress Notes (Signed)
Patient ID: Jeremy Booker, male   DOB: 06/12/1965, 49 y.o.   MRN: [id number]   Subjective:  Patient ID: Jeremy Booker, male    DOB: Feb 09, 1966  Age: 49 y.o. MRN: [id number]  CC: Follow-up; Back Pain; and Rectal Bleeding  HPI Jeremy Booker presents for chest pain, chronic back pain  1. L sided chest pain: sharp pain. Down arm. Associated with sob and tightness in chest. Feels like this all the time. Sometimes worse with stress. Smoked in past, quit in 06/2014. Not taking NTG due to headache. Pain in chest is currently 5-6/10.   2.  Shortness of breath: used albuterol twice today. SOB all the time. Has hx of SOB and CP of unclear etiology. Former smoker.    3. GERD: has chronic GERD. Not taking a PPI.   4. Rectal bleeding: last 3 days ago. First noted 2 weeks ago. No previous colonoscopy. Painless bleeding. No abdominal pain, fever or chills.   Outpatient Prescriptions Prior to Visit  Medication Sig Dispense Refill  . albuterol (PROVENTIL HFA;VENTOLIN HFA) 108 (90 BASE) MCG/ACT inhaler Inhale 2 puffs into the lungs every 6 (six) hours as needed for wheezing or shortness of breath. 3 Inhaler 1  . cloNIDine (CATAPRES) 0.1 MG tablet One three times daily 90 tablet 11  . methocarbamol (ROBAXIN) 500 MG tablet Take 1 tablet (500 mg total) by mouth 2 (two) times daily. 60 tablet 0  . nitroGLYCERIN (NITROSTAT) 0.4 MG SL tablet Place 1 tablet (0.4 mg total) under the tongue every 5 (five) minutes as needed for chest pain. 50 tablet 1  . rivaroxaban (XARELTO) 20 MG TABS tablet Take 1 tablet (20 mg total) by mouth daily with supper. 90 tablet 3  . traMADol (ULTRAM) 50 MG tablet Take 1 tablet (50 mg total) by mouth every 8 (eight) hours as needed. 30 tablet 0  . gabapentin (NEURONTIN) 400 MG capsule Take 1 capsule (400 mg total) by mouth 3 (three) times daily. (Patient not taking: Reported on 01/26/2015) 90 capsule 1   No facility-administered medications prior to visit.   Social History  Substance  Use Topics  . Smoking status: Former Smoker -- 0.25 packs/day for 30 years    Types: Cigarettes    Quit date: 06/12/2014  . Smokeless tobacco: Never Used  . Alcohol Use: No     Comment: Previously drank heavily on a daily basis.  Now drinks a few bottles of wine 1-2 x /wk (06/2014)   ROS Review of Systems  Constitutional: Negative for fever, chills, fatigue and unexpected weight change.  Eyes: Negative for visual disturbance.  Respiratory: Negative for cough and shortness of breath.   Cardiovascular: Negative for chest pain, palpitations and leg swelling.  Gastrointestinal: Positive for blood in stool and anal bleeding. Negative for nausea, vomiting, abdominal pain, diarrhea and constipation.  Endocrine: Negative for polydipsia, polyphagia and polyuria.  Musculoskeletal: Positive for myalgias and back pain. Negative for arthralgias, gait problem and neck pain.  Skin: Negative for rash.  Allergic/Immunologic: Negative for immunocompromised state.  Hematological: Negative for adenopathy. Does not bruise/bleed easily.  Psychiatric/Behavioral: Negative for suicidal ideas, sleep disturbance and dysphoric mood. The patient is not nervous/anxious.     Objective:  BP 137/78 mmHg  Pulse 62  Temp(Src) 97.9 F (36.6 C) (Oral)  Resp 16  Ht 5\' 11"  (1.803 m)  Wt 175 lb (79.379 kg)  BMI 24.42 kg/m2  SpO2 97%  BP/Weight 01/26/2015 01/16/2015 01/10/5175  Systolic BP 160 737 106  Diastolic BP  78 88 52  Wt. (Lbs) 175 178 176.8  BMI 24.42 24.84 24.67    Physical Exam  Constitutional: He appears well-developed and well-nourished. No distress.  HENT:  Head: Normocephalic and atraumatic.  Neck: Normal range of motion. Neck supple.  Cardiovascular: Normal rate, regular rhythm, normal heart sounds and intact distal pulses.   Pulmonary/Chest: Effort normal and breath sounds normal.  Genitourinary: Rectum normal and prostate normal. Rectal exam shows no external hemorrhoid, no internal hemorrhoid, no  fissure, no mass, no tenderness and anal tone normal.  No stool in rectal vault for stool card   Musculoskeletal: He exhibits no edema.  Neurological: He is alert.  Skin: Skin is warm and dry. No rash noted. No erythema.  Psychiatric: He has a normal mood and affect.     Assessment & Plan:   Problem List Items Addressed This Visit    Atypical chest pain   Relevant Medications   pantoprazole (PROTONIX) 40 MG tablet   Blood per rectum   Relevant Orders   CBC (Completed)   Chronic low back pain - Primary   Relevant Medications   traMADol (ULTRAM) 50 MG tablet   COPD (chronic obstructive pulmonary disease)   Relevant Medications   budesonide-formoterol (SYMBICORT) 80-4.5 MCG/ACT inhaler    Other Visit Diagnoses    Screening for HIV (human immunodeficiency virus)        Relevant Orders    HIV antibody (with reflex) (Completed)       No orders of the defined types were placed in this encounter.    Follow-up: No Follow-up on file.   Boykin Nearing MD

## 2015-01-27 LAB — CBC
HEMATOCRIT: 45.4 % (ref 39.0–52.0)
Hemoglobin: 15.2 g/dL (ref 13.0–17.0)
MCH: 30.6 pg (ref 26.0–34.0)
MCHC: 33.5 g/dL (ref 30.0–36.0)
MCV: 91.3 fL (ref 78.0–100.0)
MPV: 9.7 fL (ref 8.6–12.4)
Platelets: 347 10*3/uL (ref 150–400)
RBC: 4.97 MIL/uL (ref 4.22–5.81)
RDW: 13.7 % (ref 11.5–15.5)
WBC: 6.2 10*3/uL (ref 4.0–10.5)

## 2015-01-27 LAB — HIV ANTIBODY (ROUTINE TESTING W REFLEX): HIV: NONREACTIVE

## 2015-01-29 ENCOUNTER — Encounter: Payer: Self-pay | Admitting: Family Medicine

## 2015-02-02 ENCOUNTER — Ambulatory Visit: Payer: Self-pay

## 2015-02-06 ENCOUNTER — Ambulatory Visit (INDEPENDENT_AMBULATORY_CARE_PROVIDER_SITE_OTHER): Payer: Self-pay | Admitting: Physician Assistant

## 2015-02-06 ENCOUNTER — Encounter: Payer: Self-pay | Admitting: Physician Assistant

## 2015-02-06 ENCOUNTER — Telehealth: Payer: Self-pay | Admitting: Clinical

## 2015-02-06 VITALS — BP 100/70 | HR 79 | Ht 71.0 in | Wt 175.0 lb

## 2015-02-06 DIAGNOSIS — R0789 Other chest pain: Secondary | ICD-10-CM

## 2015-02-06 DIAGNOSIS — R079 Chest pain, unspecified: Secondary | ICD-10-CM

## 2015-02-06 DIAGNOSIS — J438 Other emphysema: Secondary | ICD-10-CM

## 2015-02-06 DIAGNOSIS — I2699 Other pulmonary embolism without acute cor pulmonale: Secondary | ICD-10-CM

## 2015-02-06 DIAGNOSIS — I10 Essential (primary) hypertension: Secondary | ICD-10-CM

## 2015-02-06 MED ORDER — NITROGLYCERIN 0.4 MG SL SUBL: 0.4000 mg | SUBLINGUAL_TABLET | SUBLINGUAL | Status: DC | PRN

## 2015-02-06 MED ORDER — NITROGLYCERIN 0.4 MG SL SUBL
0.4000 mg | SUBLINGUAL_TABLET | Freq: Once | SUBLINGUAL | Status: AC
  Administered 2015-02-06: 0.4 mg via SUBLINGUAL

## 2015-02-06 NOTE — Telephone Encounter (Signed)
Pt says he wants to see [facility] after financial counseling tomorrow on 02-07-15 to "talk about some things".

## 2015-02-06 NOTE — Progress Notes (Signed)
Cardiology Office Note   Date:  02/06/2015   ID:  Jeremy Booker, DOB 1966-03-06, MRN [id number]  PCP:  Minerva Ends, MD  Cardiologist:  Seen in hospital by Dr. Bronson Ing  Chief Complaint: Chest pain    History of Present Illness: Jeremy Booker is a 49 y.o. male who presents for evaluate on shin of chest pain. He was in the emergency room 12/21/2014 with recurrent chest pain associated with shortness of breath. Pain went down his left arm and into his neck. He also has a history of pulmonary embolus and is on Xarelto. His troponins were negative and EKG without acute change. CT ANGIO ruled out recurrent pulmonary embolus and showed resolution of previous right lower lobe embolus. He also has COPD quit smoking 09/2014.   Patient was seen by Jeremy Booker in the hospital in 06/2014 when he had his pulmonary embolus. He has a history of congenital heart disease with surgery as a child. He has a history of alcohol abuse as well as drug abuse including cocaine and marijuana. He apparently had a cardiac catheterization in 2010 at [facility] in [place] which was supposedly normal according to the patient. 2-D echo 07/03/14 showed normal LV function mildly sclerotic aortic valve with mildly dilated aortic root.  Patient then went to [facility] at the end of August got out Sept 2nd. Had a pharmacologic stress test that was read as low risk normal LV wall motion EF 59% with suspected RV hypertrophy 01/12/15. He was told his pain could be secondary to reflux and to follow-up with his primary.  The patient comes in today complaining of recurrent chest pain since last February. He says it just comes on him all the time and goes down his arm and associated with shortness of breath. He is complaining of pain while in the office and kind of jerks and winces and jumps around when he has it and he seems to hyperventilate. He denies smoking, alcohol, cocaine or marijuana use.    Past  Medical History  Diagnosis Date  . COPD (chronic obstructive pulmonary disease)   . Hypertension        . Tobacco abuse     a. 30 pack years. Quit   . ETOH abuse     a. 06/2014 currently a few bottles of wine/week.  . Drug abuse     a. 06/2014 Cocaine/Marijuana - last used a few months ago.  . Congenital heart disease     a. thinks he had "a hole in my heart" s/p surgical correction @ age 63 and then age 30.  . Chest pain     a. 2010 s/p cath in [place], [place] - reportedly nl.  . Pulmonary embolism     Past Surgical History  Procedure Laterality Date  . Cardiac surgery      congenital      Current Outpatient Prescriptions  Medication Sig Dispense Refill  . albuterol (PROVENTIL HFA;VENTOLIN HFA) 108 (90 BASE) MCG/ACT inhaler Inhale 2 puffs into the lungs every 6 (six) hours as needed for wheezing or shortness of breath. 3 Inhaler 1  . budesonide-formoterol (SYMBICORT) 80-4.5 MCG/ACT inhaler Inhale 2 puffs into the lungs 2 (two) times daily. 1 Inhaler 3  . cloNIDine (CATAPRES) 0.1 MG tablet One three times daily 90 tablet 11  . methocarbamol (ROBAXIN) 500 MG tablet Take 1 tablet (500 mg total) by mouth 2 (two) times daily. 60 tablet 0  . methocarbamol (ROBAXIN) 750 MG tablet Take  750 mg by mouth 2 (two) times daily.     . nitroGLYCERIN (NITROSTAT) 0.4 MG SL tablet Place 1 tablet (0.4 mg total) under the tongue every 5 (five) minutes as needed for chest pain. 50 tablet 1  . pantoprazole (PROTONIX) 40 MG tablet Take 1 tablet (40 mg total) by mouth daily. 30 tablet 3  . rivaroxaban (XARELTO) 20 MG TABS tablet Take 1 tablet (20 mg total) by mouth daily with supper. 90 tablet 3  . traMADol (ULTRAM) 50 MG tablet Take 1 tablet (50 mg total) by mouth every 8 (eight) hours as needed. 60 tablet 0  . nitroGLYCERIN (NITROSTAT) 0.4 MG SL tablet Place 1 tablet (0.4 mg total) under the tongue every 5 (five) minutes as needed for chest pain. 1 tablet 0   No current facility-administered medications  for this visit.    Allergies:   Buprenorphine hcl; Morphine and related; Baclofen; Dilaudid; Morphine; and Naprosyn    Social History:  The patient  reports that he quit smoking about 7 months ago. His smoking use included Cigarettes. He has a 7.5 pack-year smoking history. He has never used smokeless tobacco. He reports that he does not drink alcohol or use illicit drugs.   Family History:  The patient's   family history includes Heart disease in his maternal uncle; Hypertension in his maternal uncle and mother; Other in his father. There is no history of Heart attack or Stroke.    ROS:  Please see the history of present illness.   Otherwise, review of systems are positive for positive for every system sweating, fatigue, appetite changes, chest pain leg pain back pain cough snoring wheezing constipation nausea vomiting anxiety depression back pain muscle pain headaches.     PHYSICAL EXAM: VS:  BP 100/70 mmHg  Pulse 79  Ht 5\' 11"  (1.803 m)  Wt 175 lb (79.379 kg)  BMI 24.42 kg/m2  SpO2 97% , BMI Body mass index is 24.42 kg/(m^2). GEN: Well nourished, well developed, in no acute distress Neck: no JVD, HJR, carotid bruits, or masses Cardiac: RRR; positive S4 and 2/6 systolic murmur at the left sternal border, no rubs, thrill or heave,  Respiratory:  clear to auscultation bilaterally, normal work of breathing GI: soft, nontender, nondistended, + BS MS: no deformity or atrophy Extremities: without cyanosis, clubbing, edema, good distal pulses bilaterally.  Skin: warm and dry, no rash Neuro:  Strength and sensation are intact    EKG:  EKG is ordered today. The ekg ordered today demonstrates normal sinus rhythm with right bundle branch block question old anterior infarct PVC no acute change when compared to EKG 12/21/2014   Recent Labs: 07/05/2014: ALT 37; TSH 0.636 12/21/2014: B Natriuretic Peptide 65.3; BUN 6; Creatinine, Ser 1.02; Potassium 3.7; Sodium 139 01/26/2015: Hemoglobin 15.2;  Platelets 347    Lipid Panel    Component Value Date/Time   CHOL 192 07/03/2014 0130   TRIG 149 07/03/2014 0130   HDL 51 07/03/2014 0130   CHOLHDL 3.8 07/03/2014 0130   VLDL 30 07/03/2014 0130   LDLCALC 111* 07/03/2014 0130      Wt Readings from Last 3 Encounters:  02/06/15 175 lb (79.379 kg)  01/26/15 175 lb (79.379 kg)  01/16/15 178 lb (80.74 kg)      Other studies Reviewed: Additional studies/ records that were reviewed today include and review of the records demonstrates:   IMPRESSION: 1. No evidence of acute pulmonary emboli. 2. Interval partial recanalization of the right lower lobe pulmonary artery and the  multiple segmental branches which were involved by the prior emboli. Chronic filling defects persist, however. 3. COPD/emphysema. Scattered areas of hyperlucency in both lungs consistent with localized air trapping mass can be seen in patients with asthma. No acute cardiopulmonary disease otherwise. 4. Marked right ventricular enlargement and right ventricular hypertrophy, moderate to marked left ventricular hypertrophy, marked right atrial enlargement, and marked dilation of the pulmonic valve leaflets, unchanged since the prior examinations.     Electronically Signed   By: Evangeline Dakin M.D.   On: 12/21/2014 15:45 2-D echo 06/2014 Study Conclusions  - Left ventricle: The cavity size was normal. Wall thickness was   increased in a pattern of mild LVH. Systolic function was normal.   The estimated ejection fraction was in the range of 55% to 60%.   Wall motion was normal; there were no regional wall motion   abnormalities. Left ventricular diastolic function parameters   were normal. - Aortic valve: There was trivial regurgitation. - Aortic root: The aortic root was mildly dilated.  Impressions:  - Normal LV function; mildly sclerotic aortic valve with trace AI;   mildly dilated aortic root; suggest CTA to further assess.  IMPRESSION:  1. No  scintigraphic evidence of prior infarction or  pharmacologically induced ischemia.    2. Normal left ventricular wall motion.    3. Left ventricular ejection fraction 59%, though there is suspected  right ventricular hypertrophy. Further evaluation cardiac echo could  be performed as clinically indicated.    4. Low-risk stress test findings*.    *2012 Appropriate Use Criteria for Coronary Revascularization  Focused Update: J Am Coll Cardiol. 0947;09(6):283-662.  http://content.airportbarriers.com.aspx?articleid=[email or web address]      Electronically Signed  ByJoaquim Lai M.D.  On: 01/12/2015 13:02     ASSESSMENT AND PLAN: Atypical chest pain Patient has recurrent chest pain with left arm pain that sharp shooting and says it takes his breath away. He is wincing at times and jumping around in the room. Seems like he has a lot of anxiety associated with this. He takes pain medications for this. He's had multiple workups in the past including cardiac catheterizations recent normal pharmacological stress test done 01/12/15 in [facility]. They recommend possible endoscopy. Recommend follow-up with his primary care for further workup. Discussed with Dr.Nahser who reviewed his EKG and records and agrees with above recommendations.  Pulmonary embolus Patient had a CT and 12/2014 with no recurrent pulmonary embolus  Hypertension Blood pressure controlled  COPD (chronic obstructive pulmonary disease) Followed by Dr. Melvyn Novas.     Sumner Boast, PA-C  02/06/2015 12:39 PM    [facility] [address], [place], [place]  [address] Phone: [phone]; Fax: [phone]

## 2015-02-06 NOTE — Assessment & Plan Note (Signed)
Patient had a CT and 12/2014 with no recurrent pulmonary embolus

## 2015-02-06 NOTE — Patient Instructions (Addendum)
Medication Instructions:  Your physician recommends that you continue on your current medications as directed. Please refer to the Current Medication list given to you today.  Lab work: NONE  Testing/Procedures: NONE  Follow-Up: Your physician recommends that you schedule a follow-up appointment as soon as possible with your primary care doctor.  Your physician recommends that you schedule a follow-up appointment as soon as possible with your gastrologist doctor.   Any Other Special Instructions Will Be Listed Below (If Applicable).

## 2015-02-06 NOTE — Addendum Note (Signed)
Addended by: Ewell Poe L on: 02/06/2015 01:01 PM   Modules accepted: Orders, Medications

## 2015-02-06 NOTE — Assessment & Plan Note (Addendum)
Patient has recurrent chest pain with left arm pain that sharp shooting and says it takes his breath away. He is wincing at times and jumping around in the room. Seems like he has a lot of anxiety associated with this. He takes pain medications for this. He's had multiple workups in the past including cardiac catheterizations recent normal pharmacological stress test done 01/12/15 in [facility]. They recommend possible endoscopy. Recommend follow-up with his primary care for further workup. Discussed with Dr.Nahser who reviewed his EKG and records and agrees with above recommendations.

## 2015-02-06 NOTE — Assessment & Plan Note (Signed)
Blood pressure controlled. 

## 2015-02-06 NOTE — Assessment & Plan Note (Signed)
Followed by Dr Wert 

## 2015-02-07 ENCOUNTER — Telehealth: Payer: Self-pay | Admitting: Family Medicine

## 2015-02-07 ENCOUNTER — Ambulatory Visit: Payer: Self-pay | Attending: Family Medicine

## 2015-02-07 ENCOUNTER — Encounter (HOSPITAL_BASED_OUTPATIENT_CLINIC_OR_DEPARTMENT_OTHER): Payer: Self-pay | Admitting: Clinical

## 2015-02-07 DIAGNOSIS — F418 Other specified anxiety disorders: Secondary | ICD-10-CM

## 2015-02-07 DIAGNOSIS — F32A Depression, unspecified: Secondary | ICD-10-CM

## 2015-02-07 DIAGNOSIS — F419 Anxiety disorder, unspecified: Principal | ICD-10-CM

## 2015-02-07 DIAGNOSIS — F329 Major depressive disorder, single episode, unspecified: Secondary | ICD-10-CM

## 2015-02-07 NOTE — Telephone Encounter (Signed)
Pt. States provider would be writing a letter stating his condition and limitations.Marland KitchenMarland KitchenMarland KitchenMarland Kitchenplease follow up with patient.....[phone].Marland KitchenMarland KitchenMarland Kitchen

## 2015-02-07 NOTE — Progress Notes (Signed)
ASSESSMENT: Pt currently experiencing symptoms of anxiety and depression, and needs to establish [facility] med management with psychiatrist; would benefit from continued supportive counseling regarding coping with symptoms of anxiety and depression.  Stage of Change: contemplative  PLAN: 1. F/U with behavioral health consultant in as needed 2. Psychiatric Medications: none. 3. Behavioral recommendation(s):   -Go to [facility] in [place] for [facility] med management/ psych referral for disability if needed -Consider using humor as a coping strategy for reducing physical pain  SUBJECTIVE: Pt. referred by self for symptoms of anxiety and depression:  Pt. reports the following symptoms/concerns: Pt states that he is starting to understand that the physical pain and emotional pain may be related, since nothing has been found to confirm the physical pain, the pain meds are not enough to keep the pain manageable for him. Pt says he does not have time to talk long today, but wants to come back and will think about working together w [facility] to formulate a plan to cope with depression, anxiety and chronic pain. Pt also wants to see psychiatrist, as he thinks anxiety meds may be helpful for him in managing stress, anxiety and pain.  Duration of problem: recent understanding of connection between physical and emotional pain, about a week Severity: severe  OBJECTIVE: Orientation & Cognition: Oriented x3. Thought processes normal and appropriate to situation. Mood: appropriate. Affect: appropriate Appearance: appropriate Risk of harm to self or others: no risk of harm to self or others Substance use: none Assessments administered: no today "have to leave and come back next week"  Diagnosis: Anxiety and depression CPT Code: F41.8 -------------------------------------------- Other(s) present in the room: girlfriend  Time spent with patient in exam room: 15 minutes

## 2015-02-08 ENCOUNTER — Other Ambulatory Visit: Payer: Self-pay

## 2015-02-08 NOTE — Telephone Encounter (Signed)
Patient called stating that he lost all his current medication. Patient would like to speak to PCP. Please f/u with pt.

## 2015-02-09 NOTE — Telephone Encounter (Signed)
Patient called stating that he lost all of his medications and would like to have them filled again but it is too early for a refill. Please f/u with pt.

## 2015-02-12 ENCOUNTER — Ambulatory Visit: Payer: Self-pay

## 2015-02-12 ENCOUNTER — Other Ambulatory Visit: Payer: Self-pay

## 2015-02-12 NOTE — Telephone Encounter (Signed)
Patient called stating that he lost all of his medications and would like to have them filled again but it is too early for a refill. Please f/u with pt.

## 2015-02-14 ENCOUNTER — Telehealth: Payer: Self-pay | Admitting: Family Medicine

## 2015-02-14 DIAGNOSIS — M545 Low back pain: Principal | ICD-10-CM

## 2015-02-14 DIAGNOSIS — G8929 Other chronic pain: Secondary | ICD-10-CM

## 2015-02-14 NOTE — Telephone Encounter (Signed)
Patient called requesting a med refill on traMADol (ULTRAM) 50 MG tablet. Please f/u with pt.

## 2015-02-15 NOTE — Telephone Encounter (Signed)
Patient called to check on the status of the letter he requested on his last office visit. Patient needs a letter stating that his conditions and limitations are.  Patient also wanted to speak to nurse because he has been having back pain and chest pain.   Please f/u with pt.

## 2015-02-16 NOTE — Telephone Encounter (Signed)
Patient came in today requesting me refill on traMADol (ULTRAM) 50 MG tablet. Patient also requested a letter that was going to be written by the PCP stating he would not be able to work. Please f/u with pt.

## 2015-02-19 ENCOUNTER — Ambulatory Visit: Payer: Self-pay | Attending: Family Medicine | Admitting: Clinical

## 2015-02-19 DIAGNOSIS — F419 Anxiety disorder, unspecified: Principal | ICD-10-CM

## 2015-02-19 DIAGNOSIS — F329 Major depressive disorder, single episode, unspecified: Secondary | ICD-10-CM

## 2015-02-19 DIAGNOSIS — F418 Other specified anxiety disorders: Secondary | ICD-10-CM
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
## 2015-02-20 ENCOUNTER — Ambulatory Visit: Payer: Self-pay | Admitting: Family Medicine

## 2015-02-28 NOTE — Telephone Encounter (Signed)
Pt. Called requesting a med refill on traMADol (ULTRAM) 50 MG tablet. Pt. Also stated that PCP was going to write a letter for him stating he is not able to work because of his disability.

## 2015-03-05 ENCOUNTER — Other Ambulatory Visit: Payer: Self-pay

## 2015-03-07 ENCOUNTER — Other Ambulatory Visit: Payer: Self-pay | Admitting: Family Medicine

## 2015-03-07 ENCOUNTER — Telehealth: Payer: Self-pay | Admitting: *Deleted

## 2015-03-07 DIAGNOSIS — M545 Low back pain, unspecified: Secondary | ICD-10-CM

## 2015-03-07 DIAGNOSIS — I517 Cardiomegaly: Secondary | ICD-10-CM

## 2015-03-07 DIAGNOSIS — G8929 Other chronic pain: Secondary | ICD-10-CM

## 2015-03-07 MED ORDER — TRAMADOL HCL 50 MG PO TABS: 50.0000 mg | ORAL_TABLET | Freq: Three times a day (TID) | ORAL | Status: DC | PRN

## 2015-03-07 NOTE — Progress Notes (Signed)
Tramadol is ready for pickup 

## 2015-03-07 NOTE — Telephone Encounter (Signed)
Pt had CT angio on 12/21/14. Do pt still need to have this repeated? thanks

## 2015-03-07 NOTE — Telephone Encounter (Signed)
-----   Message from Tanda Rockers, MD sent at 10/02/2014  7:16 PM EDT ----- Needs non contrast ct f/u nodule with ov same day

## 2015-03-07 NOTE — Telephone Encounter (Signed)
No need for CT repeat or pulmonary f/u though I do note his CTa chest 12/21/14 showed abn RV whereas previous echo at time of PE did not raising the possibility of Jeremy Booker so needs to have the 2 d repeated and f/u by Cardiology here if not already being followed in [place]

## 2015-03-08 DIAGNOSIS — I517 Cardiomegaly: Secondary | ICD-10-CM | POA: Insufficient documentation

## 2015-03-08 NOTE — Telephone Encounter (Signed)
LMTCB for pt 

## 2015-03-08 NOTE — Telephone Encounter (Signed)
Ordered ECHO for [facility] Placed cards referral  Jeremy Booker, please schedule ECHO and call patient with details ECHO is to take a look at the size, pumping and valves in patient's heart

## 2015-03-09 NOTE — Telephone Encounter (Signed)
Patient is here and is asking for a status update on this request. Thank you, Fonda Kinder, ASA

## 2015-03-09 NOTE — Telephone Encounter (Signed)
Yes the order was in our order [place]

## 2015-03-09 NOTE — Telephone Encounter (Signed)
[place], is this what you were referring to this AM?

## 2015-03-12 ENCOUNTER — Telehealth (HOSPITAL_COMMUNITY): Payer: Self-pay | Admitting: *Deleted

## 2015-03-14 ENCOUNTER — Telehealth: Payer: Self-pay | Admitting: *Deleted

## 2015-03-14 NOTE — Telephone Encounter (Signed)
Pt notified Echo appointment at [facility] vascular lab on 03/20/2015 at 10:00

## 2015-03-15 ENCOUNTER — Ambulatory Visit: Payer: Self-pay | Admitting: Family Medicine

## 2015-03-20 ENCOUNTER — Ambulatory Visit (HOSPITAL_COMMUNITY): Payer: Self-pay

## 2015-03-22 ENCOUNTER — Telehealth: Payer: Self-pay | Admitting: Family Medicine

## 2015-03-22 DIAGNOSIS — G8929 Other chronic pain: Secondary | ICD-10-CM

## 2015-03-22 DIAGNOSIS — M545 Low back pain: Secondary | ICD-10-CM

## 2015-03-22 DIAGNOSIS — R0789 Other chest pain: Secondary | ICD-10-CM

## 2015-03-22 NOTE — Telephone Encounter (Signed)
Please return call to patient.  He complained of SOB and blood in stool during his last OV with me. O2 sat was normal. Hgb is stable. Heme stool card was negative at last OV.   Additionally, he has COPD and atypical CP. He has a pulmonologist for COPD. He was seen by cardiology for the CP and they determined it was non-cardiac, perhaps GI related or anxiety.    I will not write a letter at this time as he exam and clinical findings do not support disability.   I have referred him to GI for further evaluation of this CP as the cardiologist does not believe it is cardiac in nature.

## 2015-03-22 NOTE — Telephone Encounter (Signed)
Pt. Called stateing that PCP was going to write a letter for him stating he is not able to work because of his disability. Please f/u with pt.

## 2015-03-26 ENCOUNTER — Ambulatory Visit (HOSPITAL_COMMUNITY): Admission: RE | Admit: 2015-03-26 | Payer: Self-pay | Source: Ambulatory Visit

## 2015-03-26 ENCOUNTER — Ambulatory Visit: Payer: Self-pay | Admitting: Physician Assistant

## 2015-03-28 ENCOUNTER — Encounter: Payer: Self-pay | Admitting: Internal Medicine

## 2015-03-29 ENCOUNTER — Other Ambulatory Visit: Payer: Self-pay | Admitting: Family Medicine

## 2015-04-02 ENCOUNTER — Ambulatory Visit (HOSPITAL_COMMUNITY)
Admission: RE | Admit: 2015-04-02 | Discharge: 2015-04-02 | Disposition: A | Discharge status: Home / Self Care | Payer: Self-pay | Source: Ambulatory Visit | Attending: Family Medicine | Admitting: Family Medicine

## 2015-04-02 DIAGNOSIS — F17200 Nicotine dependence, unspecified, uncomplicated: Secondary | ICD-10-CM | POA: Insufficient documentation

## 2015-04-02 DIAGNOSIS — I10 Essential (primary) hypertension: Secondary | ICD-10-CM | POA: Insufficient documentation

## 2015-04-02 DIAGNOSIS — I371 Nonrheumatic pulmonary valve insufficiency: Secondary | ICD-10-CM | POA: Insufficient documentation

## 2015-04-02 DIAGNOSIS — I517 Cardiomegaly: Secondary | ICD-10-CM | POA: Insufficient documentation

## 2015-04-02 DIAGNOSIS — I351 Nonrheumatic aortic (valve) insufficiency: Secondary | ICD-10-CM | POA: Insufficient documentation

## 2015-04-02 DIAGNOSIS — I77810 Thoracic aortic ectasia: Secondary | ICD-10-CM | POA: Insufficient documentation

## 2015-04-02 DIAGNOSIS — I340 Nonrheumatic mitral (valve) insufficiency: Secondary | ICD-10-CM | POA: Insufficient documentation

## 2015-04-02 NOTE — Progress Notes (Signed)
  Echocardiogram 2D Echocardiogram has been performed.  Jeremy Booker 04/02/2015, 4:18 PM

## 2015-04-03 NOTE — Telephone Encounter (Signed)
Pt. Called requesting for PCP to refer him to a  Back specialist. Pt. Would also like a med refill on tramadol. Please f/u with pt.

## 2015-04-04 ENCOUNTER — Telehealth: Payer: Self-pay | Admitting: *Deleted

## 2015-04-04 NOTE — Telephone Encounter (Signed)
-----   Message from Boykin Nearing, MD sent at 04/03/2015  9:09 AM EST ----- Normal EF (heart pumping) on ECHO. There is thickening of the heart muscle consistent with HTN, BP is under control  Continue current care plan

## 2015-04-04 NOTE — Telephone Encounter (Signed)
LVM to return call.

## 2015-04-09 ENCOUNTER — Ambulatory Visit: Payer: Self-pay | Admitting: Physician Assistant

## 2015-04-09 DIAGNOSIS — R0989 Other specified symptoms and signs involving the circulatory and respiratory systems: Secondary | ICD-10-CM

## 2015-04-12 ENCOUNTER — Encounter: Payer: Self-pay | Admitting: Physician Assistant

## 2015-04-16 ENCOUNTER — Telehealth: Payer: Self-pay | Admitting: Family Medicine

## 2015-04-16 ENCOUNTER — Ambulatory Visit: Payer: Self-pay | Admitting: Family Medicine

## 2015-04-16 NOTE — Telephone Encounter (Signed)
Pt would like to know why his refill request from 03/29/15 was refused. Please advise. Thank you, Fonda Kinder, ASA

## 2015-04-19 MED ORDER — TRAMADOL HCL 50 MG PO TABS: 50.0000 mg | ORAL_TABLET | Freq: Three times a day (TID) | ORAL | Status: DC | PRN

## 2015-04-19 NOTE — Telephone Encounter (Signed)
Unable to contact pt    Rx tramadol at front office

## 2015-04-19 NOTE — Telephone Encounter (Signed)
Referral placed Tramadol refilled and ready fir pick up

## 2015-04-19 NOTE — Telephone Encounter (Signed)
Tramadol has been refilled Upon chart review, I do not see a request refusal, the only possible reason is if the request was made too early.

## 2015-04-20 NOTE — Telephone Encounter (Signed)
Unable to contact pt #2

## 2015-05-03 ENCOUNTER — Ambulatory Visit: Payer: Self-pay | Admitting: Family Medicine

## 2015-05-04 ENCOUNTER — Ambulatory Visit: Payer: Self-pay | Attending: Family Medicine | Admitting: Family Medicine

## 2015-05-04 ENCOUNTER — Encounter: Payer: Self-pay | Admitting: Family Medicine

## 2015-05-04 VITALS — BP 107/65 | HR 78 | Temp 97.4°F | Resp 18 | Ht 70.0 in | Wt 174.0 lb

## 2015-05-04 DIAGNOSIS — M545 Low back pain: Secondary | ICD-10-CM

## 2015-05-04 DIAGNOSIS — I2699 Other pulmonary embolism without acute cor pulmonale: Secondary | ICD-10-CM

## 2015-05-04 DIAGNOSIS — F419 Anxiety disorder, unspecified: Secondary | ICD-10-CM | POA: Insufficient documentation

## 2015-05-04 DIAGNOSIS — Z7901 Long term (current) use of anticoagulants: Secondary | ICD-10-CM | POA: Insufficient documentation

## 2015-05-04 DIAGNOSIS — G8929 Other chronic pain: Secondary | ICD-10-CM

## 2015-05-04 DIAGNOSIS — R0789 Other chest pain: Secondary | ICD-10-CM

## 2015-05-04 DIAGNOSIS — M25562 Pain in left knee: Secondary | ICD-10-CM

## 2015-05-04 DIAGNOSIS — R0602 Shortness of breath: Secondary | ICD-10-CM | POA: Insufficient documentation

## 2015-05-04 DIAGNOSIS — Z87891 Personal history of nicotine dependence: Secondary | ICD-10-CM | POA: Insufficient documentation

## 2015-05-04 MED ORDER — METHYLPREDNISOLONE ACETATE 40 MG/ML IJ SUSP
40.0000 mg | Freq: Once | INTRAMUSCULAR | Status: AC
  Administered 2015-05-04: 40 mg via INTRA_ARTICULAR

## 2015-05-04 MED ORDER — GABAPENTIN 300 MG PO CAPS: 300.0000 mg | ORAL_CAPSULE | Freq: Three times a day (TID) | ORAL | Status: DC

## 2015-05-04 MED ORDER — RIVAROXABAN 20 MG PO TABS: 20.0000 mg | ORAL_TABLET | Freq: Every day | ORAL | Status: DC

## 2015-05-04 MED ORDER — METHOCARBAMOL 500 MG PO TABS: 500.0000 mg | ORAL_TABLET | Freq: Two times a day (BID) | ORAL | Status: DC

## 2015-05-04 MED ORDER — PANTOPRAZOLE SODIUM 40 MG PO TBEC: 40.0000 mg | DELAYED_RELEASE_TABLET | Freq: Every day | ORAL | Status: DC

## 2015-05-04 NOTE — Progress Notes (Signed)
C/C SHOB dry cough  Lt knee pain sue to injury on Monday  Pain scale 8 Former smoker  No suicidal thought in the past two weeks

## 2015-05-04 NOTE — Assessment & Plan Note (Addendum)
A; chronic pain with lumbar DJD P:  Refilled tramadol, gabapentin and robaxin Patient amenable to neurosurgery appt in [place] for $150 co-pay  Letter of limitation provided

## 2015-05-04 NOTE — Progress Notes (Signed)
Subjective:  Patient ID: Jeremy Booker, male    DOB: Apr 22, 1966  Age: 49 y.o. MRN: [id number]  CC: Back Pain and Chest Pain   HPI MARQAVIOUS BROUILLET presents for   1. Chest pain: atypical chest pain has improved. He has been evaluated by cards. Cards has recommended GI evaluation for possible EGD.   2. Dyspnea: persist. SOB at rest. He has anxiety with panic attacks. He believes it could be related to that. He has no desats. He has not fever or chills. He denies smoking. He reports compliance with inhalers.   3. Back pain: this is chronic. MRI with mild lumbar DJD. He reports pain radiates down back of legs. He walks with a cane but feels unsteady at times. He reports fall 3 days ago due to pain. He fell on L knee. He does not have tramadol as he has not yet picked up refill. He does not have robaxin or gabapentin. Requesting med refills.    4. L knee pain: anterior pain since fall. No swelling. No laceration. Pain is worse with knee extension. He does not have tramadol as he has not yet picked up refill. He does not have robaxin or gabapentin.   5. Letter of disability: he is requesting a letter of disability so he can get food stamps. He wants the letter to state that he cannot work. He feels disabled due to SOB and chronic back pain.   Social History  Substance Use Topics  . Smoking status: Former Smoker -- 0.25 packs/day for 30 years    Types: Cigarettes    Quit date: 06/12/2014  . Smokeless tobacco: Never Used  . Alcohol Use: No     Comment: Previously drank heavily on a daily basis.  Now drinks a few bottles of wine 1-2 x /wk (06/2014)    Outpatient Prescriptions Prior to Visit  Medication Sig Dispense Refill  . albuterol (PROVENTIL HFA;VENTOLIN HFA) 108 (90 BASE) MCG/ACT inhaler Inhale 2 puffs into the lungs every 6 (six) hours as needed for wheezing or shortness of breath. 3 Inhaler 1  . budesonide-formoterol (SYMBICORT) 80-4.5 MCG/ACT inhaler Inhale 2 puffs into the lungs 2  (two) times daily. 1 Inhaler 3  . cloNIDine (CATAPRES) 0.1 MG tablet One three times daily 90 tablet 11  . methocarbamol (ROBAXIN) 500 MG tablet Take 1 tablet (500 mg total) by mouth 2 (two) times daily. 60 tablet 0  . methocarbamol (ROBAXIN) 750 MG tablet Take 750 mg by mouth 2 (two) times daily.     . nitroGLYCERIN (NITROSTAT) 0.4 MG SL tablet Place 1 tablet (0.4 mg total) under the tongue every 5 (five) minutes as needed for chest pain. 50 tablet 1  . pantoprazole (PROTONIX) 40 MG tablet Take 1 tablet (40 mg total) by mouth daily. 30 tablet 3  . rivaroxaban (XARELTO) 20 MG TABS tablet Take 1 tablet (20 mg total) by mouth daily with supper. 90 tablet 3  . traMADol (ULTRAM) 50 MG tablet Take 1 tablet (50 mg total) by mouth every 8 (eight) hours as needed. 60 tablet 0   No facility-administered medications prior to visit.    ROS Review of Systems  Constitutional: Negative for fever, chills, fatigue and unexpected weight change.  Eyes: Negative for visual disturbance.  Respiratory: Negative for cough and shortness of breath.   Cardiovascular: Positive for chest pain. Negative for palpitations and leg swelling.  Gastrointestinal: Negative for nausea, vomiting, abdominal pain, diarrhea, constipation, blood in stool and anal bleeding.  Endocrine:  Negative for polydipsia, polyphagia and polyuria.  Musculoskeletal: Positive for myalgias, back pain and arthralgias (L knee ). Negative for gait problem and neck pain.  Skin: Negative for rash.  Allergic/Immunologic: Negative for immunocompromised state.  Hematological: Negative for adenopathy. Does not bruise/bleed easily.  Psychiatric/Behavioral: Negative for suicidal ideas, sleep disturbance and dysphoric mood. The patient is not nervous/anxious.     Objective:  BP 107/65 mmHg  Pulse 78  Temp(Src) 97.4 F (36.3 C) (Oral)  Resp 18  Ht 5\' 10"  (1.778 m)  Wt 174 lb (78.926 kg)  BMI 24.97 kg/m2  SpO2 97%  BP/Weight 05/04/2015 02/06/2015  [id number]  Systolic BP [id number]  Diastolic BP 65 70 78  Wt. (Lbs) 174 175 175  BMI 24.97 24.42 24.42   Physical Exam  Constitutional: He appears well-developed and well-nourished. No distress.  HENT:  Head: Normocephalic and atraumatic.  Neck: Normal range of motion. Neck supple.  Pulmonary/Chest: Effort normal.  Musculoskeletal: He exhibits no edema.       Left knee: He exhibits decreased range of motion. He exhibits no swelling, no effusion, no ecchymosis, no deformity, no laceration, no erythema, normal alignment, no LCL laxity, normal patellar mobility, no bony tenderness, normal meniscus and no MCL laxity. Tenderness found. Medial joint line and lateral joint line tenderness noted. No LCL and no patellar tendon tenderness noted.  Neurological: He is alert.  Skin: Skin is warm and dry. No rash noted. No erythema.  Psychiatric: He has a normal mood and affect.   After obtaining informed consent and cleaning the skin using iodine and alcohol a  steroid injection was performed at L knee using 1% plain Lidocaine and 40 mg of Depo Medrol. This was well tolerated Assessment & Plan:   Problem List Items Addressed This Visit    Atypical chest pain (Chronic)    A: atypical CP with dyspnea. Patient evaluated by pulmo and cards already. CP improved. dsypnea persist P: GI for ? EGD Reassurance        Relevant Medications   pantoprazole (PROTONIX) 40 MG tablet   Other Relevant Orders   Ambulatory referral to Gastroenterology   Chronic low back pain (Chronic)    A; chronic pain with lumbar DJD P:  Refilled tramadol, gabapentin and robaxin Patient amenable to neurosurgery appt in [place] for $150 co-pay  Letter of limitation provided       Relevant Medications   methocarbamol (ROBAXIN) 500 MG tablet   gabapentin (NEURONTIN) 300 MG capsule   methylPREDNISolone acetate (DEPO-MEDROL) injection 40 mg   Left anterior knee pain    A: L anterior knee pain following fall. No evidence of  frature P:  Steroid shot done today Refill pain meds       Relevant Medications   methylPREDNISolone acetate (DEPO-MEDROL) injection 40 mg   Pulmonary embolus (HCC) - Primary (Chronic)   Relevant Medications   rivaroxaban (XARELTO) 20 MG TABS tablet      No orders of the defined types were placed in this encounter.    Follow-up: No Follow-up on file.   Boykin Nearing MD

## 2015-05-04 NOTE — Patient Instructions (Addendum)
Ellis was seen today for back pain and chest pain.  Diagnoses and all orders for this visit:  Other acute pulmonary embolism without acute cor pulmonale (HCC) -     rivaroxaban (XARELTO) 20 MG TABS tablet; Take 1 tablet (20 mg total) by mouth daily with supper.  Atypical chest pain -     pantoprazole (PROTONIX) 40 MG tablet; Take 1 tablet (40 mg total) by mouth daily. -     Ambulatory referral to Gastroenterology  Chronic low back pain -     methocarbamol (ROBAXIN) 500 MG tablet; Take 1 tablet (500 mg total) by mouth 2 (two) times daily. -     gabapentin (NEURONTIN) 300 MG capsule; Take 1 capsule (300 mg total) by mouth 3 (three) times daily.  Left anterior knee pain -     methylPREDNISolone acetate (DEPO-MEDROL) injection 40 mg; Inject 1 mL (40 mg total) into the articular space once.   You have received a shot of steroid in your joint today. Rest and ice knee today. Regular activity tomorrow. Look out for redness, swelling, fever,severe pain in joint and call if you experience these symptoms.  Tramadol was refilled on 04/19/15, please check first desk for Rx.   I will let Alinda Sierras know that you prefer [facility] neurosurgery with $150 co-pay. You will be called with appt details   F/u in 3 months for HTN  Dr. Adrian Blackwater

## 2015-05-04 NOTE — Assessment & Plan Note (Signed)
A: L anterior knee pain following fall. No evidence of frature P:  Steroid shot done today Refill pain meds

## 2015-05-04 NOTE — Assessment & Plan Note (Signed)
A: atypical CP with dyspnea. Patient evaluated by pulmo and cards already. CP improved. dsypnea persist P: GI for ? EGD Reassurance

## 2015-05-04 NOTE — Progress Notes (Signed)
Noted. Thank You.

## 2015-05-21 ENCOUNTER — Other Ambulatory Visit: Payer: Self-pay

## 2015-05-24 ENCOUNTER — Other Ambulatory Visit: Payer: Self-pay | Admitting: Family Medicine

## 2015-05-24 MED FILL — XARELTO 20 MG TABLET: 20 | 30 days supply | Qty: 30 | Fill #10

## 2015-05-24 MED FILL — ?CLONIDINE HCL 0.1 MG TABL: 0.1 | 30 days supply | Qty: 90 | Fill #9

## 2015-05-24 MED FILL — $VENTOLIN HFA 18G INHALER: 108 (90 BAS | 30 days supply | Qty: 18 | Fill #2

## 2015-05-29 ENCOUNTER — Ambulatory Visit: Payer: Self-pay | Admitting: Internal Medicine

## 2015-05-30 ENCOUNTER — Telehealth: Payer: Self-pay | Admitting: Family Medicine

## 2015-05-30 NOTE — Telephone Encounter (Signed)
Pt. Called to let PCP know that he missed his appointment with [facility] because he was admitted to the [facility] in [place].

## 2015-06-05 ENCOUNTER — Telehealth: Payer: Self-pay | Admitting: Family Medicine

## 2015-06-05 NOTE — Telephone Encounter (Signed)
Patient called requesting to speak to nurse regarding paperwork faxed from [facility] and also requesting medication refill. Please f/u

## 2015-06-06 ENCOUNTER — Ambulatory Visit: Payer: Self-pay | Attending: Family Medicine | Admitting: Family Medicine

## 2015-06-06 ENCOUNTER — Encounter (HOSPITAL_BASED_OUTPATIENT_CLINIC_OR_DEPARTMENT_OTHER): Payer: Self-pay | Admitting: Clinical

## 2015-06-06 ENCOUNTER — Encounter: Payer: Self-pay | Admitting: Family Medicine

## 2015-06-06 VITALS — BP 126/72 | HR 64 | Temp 97.9°F | Resp 16 | Ht 70.0 in | Wt 171.2 lb

## 2015-06-06 DIAGNOSIS — Z0000 Encounter for general adult medical examination without abnormal findings: Secondary | ICD-10-CM

## 2015-06-06 DIAGNOSIS — Q249 Congenital malformation of heart, unspecified: Secondary | ICD-10-CM

## 2015-06-06 DIAGNOSIS — F418 Other specified anxiety disorders: Secondary | ICD-10-CM

## 2015-06-06 DIAGNOSIS — F1910 Other psychoactive substance abuse, uncomplicated: Secondary | ICD-10-CM

## 2015-06-06 DIAGNOSIS — Z7901 Long term (current) use of anticoagulants: Secondary | ICD-10-CM | POA: Insufficient documentation

## 2015-06-06 DIAGNOSIS — F32A Depression, unspecified: Secondary | ICD-10-CM

## 2015-06-06 DIAGNOSIS — G8929 Other chronic pain: Secondary | ICD-10-CM

## 2015-06-06 DIAGNOSIS — F419 Anxiety disorder, unspecified: Principal | ICD-10-CM

## 2015-06-06 DIAGNOSIS — F329 Major depressive disorder, single episode, unspecified: Secondary | ICD-10-CM

## 2015-06-06 DIAGNOSIS — M545 Low back pain: Secondary | ICD-10-CM

## 2015-06-06 DIAGNOSIS — R0789 Other chest pain: Secondary | ICD-10-CM

## 2015-06-06 DIAGNOSIS — F1410 Cocaine abuse, uncomplicated: Secondary | ICD-10-CM | POA: Insufficient documentation

## 2015-06-06 DIAGNOSIS — J449 Chronic obstructive pulmonary disease, unspecified: Secondary | ICD-10-CM | POA: Insufficient documentation

## 2015-06-06 DIAGNOSIS — I10 Essential (primary) hypertension: Secondary | ICD-10-CM

## 2015-06-06 DIAGNOSIS — J438 Other emphysema: Secondary | ICD-10-CM

## 2015-06-06 DIAGNOSIS — Z79899 Other long term (current) drug therapy: Secondary | ICD-10-CM | POA: Insufficient documentation

## 2015-06-06 DIAGNOSIS — J439 Emphysema, unspecified: Secondary | ICD-10-CM | POA: Insufficient documentation

## 2015-06-06 DIAGNOSIS — Z86711 Personal history of pulmonary embolism: Secondary | ICD-10-CM | POA: Insufficient documentation

## 2015-06-06 LAB — POCT GLYCOSYLATED HEMOGLOBIN (HGB A1C): HEMOGLOBIN A1C: 5.4

## 2015-06-06 MED ORDER — AMLODIPINE BESYLATE 10 MG PO TABS: 10.0000 mg | ORAL_TABLET | Freq: Every day | ORAL | Status: DC

## 2015-06-06 MED ORDER — TRAMADOL HCL 50 MG PO TABS: 50.0000 mg | ORAL_TABLET | Freq: Three times a day (TID) | ORAL | Status: DC | PRN

## 2015-06-06 MED ORDER — ALBUTEROL SULFATE (2.5 MG/3ML) 0.083% IN NEBU: 2.5000 mg | INHALATION_SOLUTION | Freq: Four times a day (QID) | RESPIRATORY_TRACT | Status: DC | PRN

## 2015-06-06 MED ORDER — NITROGLYCERIN 0.4 MG SL SUBL: 0.4000 mg | SUBLINGUAL_TABLET | SUBLINGUAL | Status: DC | PRN

## 2015-06-06 MED ORDER — PANTOPRAZOLE SODIUM 40 MG PO TBEC: 40.0000 mg | DELAYED_RELEASE_TABLET | Freq: Every day | ORAL | Status: DC

## 2015-06-06 MED FILL — NITROSTAT 0.4 MG TABLET SL: 0.4 | 25 days supply | Qty: 25 | Fill #0

## 2015-06-06 MED FILL — ?PANTOPRAZOLE SOD DR 40MG: 40 MG | 30 days supply | Qty: 30 | Fill #0

## 2015-06-06 MED FILL — AMLODIPINE BESYLATE 10 MG T: 10 | 30 days supply | Qty: 30 | Fill #0

## 2015-06-06 MED FILL — METHOCARBAMOL 500 MG TABLET: 500 | 30 days supply | Qty: 60 | Fill #0

## 2015-06-06 MED FILL — traMADol HCL 50 MG TABS: 50 | 20 days supply | Qty: 60 | Fill #0

## 2015-06-06 NOTE — Progress Notes (Signed)
Patient here after hospital trip to [facility] Asking for oxycodone for pain that he has in his chest-denies pain now Stes Dr. Adrian Blackwater used to give him Rx for oxy States paperwork from [place] was faxed to our office He states hospital changed his Xarelto and Clonidine but is not sure to what Refuses flu

## 2015-06-06 NOTE — Patient Instructions (Signed)
PLAN:  1. Consider making appointment with bevavioral health consultant/counseling, as needed

## 2015-06-06 NOTE — Progress Notes (Signed)
ASSESSMENT: Pt continues to experience symptoms of anxiety and depression, needs to f/u with PCP, would benefit from supportive counseling; may benefit from brief therapy to address anxiety, depression, chronic pain, and substance use Stage of Change: precontemplative to contemplative  PLAN: 1. F/U with behavioral health consultant in as needed 2. Psychiatric Medications: none 3. Behavioral recommendation(s):   -Consider making appointment for counseling  SUBJECTIVE: Pt. referred by Dr Jarold Song for symptoms of anxiety and depression:  Pt. reports the following symptoms/concerns: Pt states that he has been meaning to make an appointment for counseling, that he missed his last appointment, and that his health is getting worse, and "I need to come in and set up a time to come in and talk some more about it". Duration of problem: At least 4 months Severity: severe  OBJECTIVE: Orientation & Cognition: Oriented x3. Thought processes normal and appropriate to situation. Mood: appropriate. Affect: appropriate Appearance: appropriate Risk of harm to self or others: no known risk of harm to self or others Substance use: alcohol, cannabis, cocaine Assessments administered: PHQ9: 27/ GAD7: 20  Diagnosis: Anxiety and depression CPT Code: F41.8 -------------------------------------------- Other(s) present in the room: girlfriend  Time spent with patient in exam room: 5 minutes

## 2015-06-06 NOTE — Telephone Encounter (Signed)
Pt has appointment to day with Dr Jarold Song  Advised pt to bring all medication with him to appointment

## 2015-06-06 NOTE — Patient Instructions (Signed)

## 2015-06-07 NOTE — Progress Notes (Signed)
Subjective:  Patient ID: Jeremy Booker, male    DOB: [birth year]  Age: 50 y.o. 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 had a cardiac MRI which revealed left ventricular ejection fraction of 62%, mildly dilated right ventricle, moderately dilated left atrium. He was seen by cardiology and CT chest findings with thought not to suggest any hemodynamic significant stenosis.  He had a urine drug screen which was positive for cocaine and opiates; his clonidine was discontinued and he was placed on amlodipine and he was taken off Robaxin. He is requesting oxycodone prescriptions today as he complains he has crushing left-sided chest pain. He denies using cocaine even though I have discussed the results of his urine drug screen with him.  Outpatient Prescriptions Prior to Visit  Medication Sig Dispense Refill  . albuterol (PROVENTIL HFA;VENTOLIN HFA) 108 (90 BASE) MCG/ACT inhaler Inhale 2 puffs into the lungs every 6 (six) hours as needed for wheezing or shortness of breath. 3 Inhaler 1  . budesonide-formoterol (SYMBICORT) 80-4.5 MCG/ACT inhaler Inhale 2 puffs into the lungs 2 (two) times daily. 1 Inhaler 3  . gabapentin (NEURONTIN) 300 MG capsule Take 1 capsule (300 mg total) by mouth 3 (three) times daily. (Patient not taking: Reported on  06/06/2015) 90 capsule 3  . rivaroxaban (XARELTO) 20 MG TABS tablet Take 1 tablet (20 mg total) by mouth daily with supper. 90 tablet 3  . cloNIDine (CATAPRES) 0.1 MG tablet One three times daily 90 tablet 11  . methocarbamol (ROBAXIN) 500 MG tablet TAKE ONE TABLET BY MOUTH TWICE DAILY (Patient not taking: Reported on 06/06/2015) 60 tablet 0  . nitroGLYCERIN (NITROSTAT) 0.4 MG SL tablet Place 1 tablet (0.4 mg total) under the tongue every 5 (five) minutes as needed for chest pain. (Patient not taking: Reported on 05/04/2015) 50 tablet 1  . pantoprazole (PROTONIX) 40 MG tablet Take 1 tablet (40 mg total) by mouth daily. (Patient not taking: Reported on 06/06/2015) 30 tablet 3  . traMADol (ULTRAM) 50 MG tablet Take 1 tablet (50 mg total) by mouth every 8 (eight) hours as needed. (Patient not taking: Reported on 05/04/2015) 60 tablet 0   No facility-administered medications prior to visit.    ROS Review of Systems  Constitutional: Negative for activity change and appetite change.  HENT: Negative for sinus pressure and sore throat.   Eyes: Negative for visual disturbance.  Respiratory: Negative for cough, chest tightness and shortness of breath.   Cardiovascular: Positive for chest pain. Negative for leg swelling.  Gastrointestinal: Negative for abdominal pain, diarrhea, constipation and abdominal distention.  Endocrine: Negative.   Genitourinary: Negative for dysuria.  Musculoskeletal: Negative for myalgias and joint swelling.  Skin: Negative for rash.  Allergic/Immunologic: Negative.   Neurological: Negative for weakness, light-headedness and numbness.  Psychiatric/Behavioral: Negative for suicidal ideas and dysphoric mood.    Objective:  BP 126/72 mmHg  Pulse 64  Temp(Src) 97.9 F (36.6 C)  Resp 16  Ht 5\' 10"  (1.778 m)  Wt 171 lb 3.2 oz (77.656 kg)  BMI 24.56 kg/m2  SpO2 99%  BP/Weight 06/06/2015 05/04/2015 [id number]  Systolic BP 123XX123 [id number]  Diastolic BP 72 65 70  Wt. (Lbs)  171.2 174 175  BMI 24.56 24.97 24.42      Physical Exam  Constitutional: He is oriented to person, place, and time. He appears well-developed and well-nourished.  Cardiovascular: Normal rate, normal heart sounds and intact distal pulses.   No murmur heard. Pulmonary/Chest: Effort normal and breath sounds normal. He has no wheezes. He has no rales. He exhibits tenderness (on palpation and when patient changes position).  Vertical healed chest wall surgical scar  Abdominal: Soft. Bowel sounds are normal. He exhibits no distension and no mass. There is no tenderness.  Musculoskeletal: Normal range of motion.  Neurological: He is alert and oriented to person, place, and time.     Assessment & Plan:   1. Health care maintenance Once 5.4-normal - HgB A1c  2. Atypical chest pain Seen by cardiology in 01/2015 and chest pain was thought to be noncardiac. I have also discussed abstaining from cocaine use as this could also present with chest pains. - nitroGLYCERIN (NITROSTAT) 0.4 MG SL tablet; Place 1 tablet (0.4 mg total) under the tongue every 5 (five) minutes as needed for chest pain. Daily max- 3 doses  Dispense: 50 tablet; Refill: 1 - pantoprazole (PROTONIX) 40 MG tablet; Take 1 tablet (40 mg total) by mouth daily.  Dispense: 30 tablet; Refill: 3  3. Essential hypertension Controlled - amLODipine (NORVASC) 10 MG tablet; Take 1 tablet (10 mg total) by mouth daily.  Dispense: 30 tablet; Refill: 3  4. Drug abuse Patient denies recently using cocaine even though his urine drug screen from 02/2016 at [facility] revealed it was positive for cocaine and opiates  5. Congenital heart disease Stable  6. Other emphysema (HCC) Stable - albuterol (PROVENTIL) (2.5 MG/3ML) 0.083% nebulizer solution; Take 3 mLs (2.5 mg total) by nebulization every 6 (six) hours as needed for wheezing or shortness of breath.  Dispense: 150 mL; Refill: 1  7. Chronic low back pain He is requesting oxycodone; I have  informed him I would only be prescribing tramadol and he might have to discuss with his PCP if he needed something stronger for a referral to pain clinic. - traMADol (ULTRAM) 50 MG tablet; Take 1 tablet (50 mg total) by mouth every 8 (eight) hours as needed.  Dispense: 60 tablet; Refill: 0   Meds ordered this encounter  Medications  . amLODipine (NORVASC) 10 MG tablet    Sig: Take 1 tablet (10 mg total) by mouth daily.    Dispense:  30 tablet    Refill:  3    Discontinue clonidine  . nitroGLYCERIN (NITROSTAT) 0.4 MG SL tablet    Sig: Place 1 tablet (0.4 mg total) under the tongue every 5 (five) minutes as needed for chest pain. Daily max- 3 doses    Dispense:  50 tablet    Refill:  1  . pantoprazole (PROTONIX) 40 MG tablet    Sig: Take 1 tablet (40 mg total) by mouth daily.    Dispense:  30 tablet    Refill:  3  . traMADol (ULTRAM) 50 MG tablet    Sig: Take 1 tablet (50 mg total) by mouth every 8 (eight) hours as needed.    Dispense:  60 tablet    Refill:  0  . albuterol (PROVENTIL) (  2.5 MG/3ML) 0.083% nebulizer solution    Sig: Take 3 mLs (2.5 mg total) by nebulization every 6 (six) hours as needed for wheezing or shortness of breath.    Dispense:  150 mL    Refill:  1    Follow-up: Return in about 3 weeks (around 06/27/2015) for follow up of chest pain with Dr Adrian Blackwater.   Arnoldo Morale MD

## 2015-06-18 ENCOUNTER — Encounter: Payer: Self-pay | Admitting: Family Medicine

## 2015-06-18 ENCOUNTER — Ambulatory Visit: Payer: Self-pay | Attending: Family Medicine | Admitting: Family Medicine

## 2015-06-18 ENCOUNTER — Ambulatory Visit (HOSPITAL_BASED_OUTPATIENT_CLINIC_OR_DEPARTMENT_OTHER): Payer: Self-pay | Admitting: Clinical

## 2015-06-18 VITALS — BP 117/75 | HR 72 | Temp 98.0°F | Resp 20 | Ht 71.0 in | Wt 160.0 lb

## 2015-06-18 DIAGNOSIS — M545 Low back pain, unspecified: Secondary | ICD-10-CM

## 2015-06-18 DIAGNOSIS — F17200 Nicotine dependence, unspecified, uncomplicated: Secondary | ICD-10-CM | POA: Insufficient documentation

## 2015-06-18 DIAGNOSIS — Q231 Congenital insufficiency of aortic valve: Secondary | ICD-10-CM | POA: Insufficient documentation

## 2015-06-18 DIAGNOSIS — G8929 Other chronic pain: Secondary | ICD-10-CM | POA: Insufficient documentation

## 2015-06-18 DIAGNOSIS — F329 Major depressive disorder, single episode, unspecified: Secondary | ICD-10-CM

## 2015-06-18 DIAGNOSIS — Z720 Tobacco use: Secondary | ICD-10-CM

## 2015-06-18 DIAGNOSIS — J438 Other emphysema: Secondary | ICD-10-CM

## 2015-06-18 DIAGNOSIS — J449 Chronic obstructive pulmonary disease, unspecified: Secondary | ICD-10-CM | POA: Insufficient documentation

## 2015-06-18 DIAGNOSIS — I10 Essential (primary) hypertension: Secondary | ICD-10-CM | POA: Insufficient documentation

## 2015-06-18 DIAGNOSIS — F418 Other specified anxiety disorders: Secondary | ICD-10-CM

## 2015-06-18 DIAGNOSIS — M5126 Other intervertebral disc displacement, lumbar region: Secondary | ICD-10-CM | POA: Insufficient documentation

## 2015-06-18 DIAGNOSIS — F419 Anxiety disorder, unspecified: Principal | ICD-10-CM

## 2015-06-18 DIAGNOSIS — Z7901 Long term (current) use of anticoagulants: Secondary | ICD-10-CM | POA: Insufficient documentation

## 2015-06-18 DIAGNOSIS — F17210 Nicotine dependence, cigarettes, uncomplicated: Secondary | ICD-10-CM | POA: Insufficient documentation

## 2015-06-18 DIAGNOSIS — Z79899 Other long term (current) drug therapy: Secondary | ICD-10-CM | POA: Insufficient documentation

## 2015-06-18 DIAGNOSIS — R0789 Other chest pain: Secondary | ICD-10-CM | POA: Insufficient documentation

## 2015-06-18 MED ORDER — ALBUTEROL SULFATE (2.5 MG/3ML) 0.083% IN NEBU: 2.5000 mg | INHALATION_SOLUTION | Freq: Four times a day (QID) | RESPIRATORY_TRACT | Status: DC | PRN

## 2015-06-18 MED FILL — GABAPENTIN 300 MG CAPSULE: 300 | 30 days supply | Qty: 90 | Fill #1

## 2015-06-18 MED FILL — ALBUTEROL 0.083% INHAL SOLN: (2.5 MG/3ML | 30 days supply | Qty: 90 | Fill #0

## 2015-06-18 NOTE — Progress Notes (Signed)
Subjective:  Patient ID: Jeremy Booker, male    DOB: 19-Jun-1965  Age: 50 y.o. MRN: [id number]  CC: Follow-up and Chest Pain   HPI Jeremy Booker presents for   1. Chest pain: he has chronic sharp L sided chest pains. Pains are worse with expiration. Pain is sharp. Pain occurs at rest. NTG does not help pain. Only induces headache. He reports last cocaine use was on 05/12/2015. He admits to anxiety. He is a light smoker. Tramadol helps a bit with pain. Current pain 5/10. He has hx of congenital heart defect and surgery. He has known bicuspid aortic. He has cardiac MRI done at [facility] on 05/30/2015.   2. Chronic low back pain: has pain and stiffness in his low back. Walks with a cane.  He has lumbar MRI done on 10/04/14: Has mild lower lumbar disc degeneration most notable at L4-5 where there is moderate left neural foraminal stenosis due to a disc protrusion. He has been referred to neurosurgery. He is uninsured and unemployed. He could not afford the $50 co-pay for the visit.   3. HTN: taking 20 mg of norvasc daily by mistake. Has CP as per above. Has SOB. Denies HA, swelling.   5. SOB: gets SOB at times. Request nebulizer machine. Gives hx of COPD. He continues to smoke.   Past Surgical History  Procedure Laterality Date  . Cardiac surgery      congenital    Social History  Substance Use Topics  . Smoking status: Light Tobacco Smoker -- 0.00 packs/day for 30 years    Types: Cigarettes  . Smokeless tobacco: Never Used  . Alcohol Use: No     Comment: Previously drank heavily on a daily basis.  Now drinks a few bottles of wine 1-2 x /wk (06/2014)   Outpatient Prescriptions Prior to Visit  Medication Sig Dispense Refill  . albuterol (PROVENTIL HFA;VENTOLIN HFA) 108 (90 BASE) MCG/ACT inhaler Inhale 2 puffs into the lungs every 6 (six) hours as needed for wheezing or shortness of breath. 3 Inhaler 1  . amLODipine (NORVASC) 10 MG tablet Take 1 tablet (10 mg total) by mouth daily. 30  tablet 3  . budesonide-formoterol (SYMBICORT) 80-4.5 MCG/ACT inhaler Inhale 2 puffs into the lungs 2 (two) times daily. 1 Inhaler 3  . nitroGLYCERIN (NITROSTAT) 0.4 MG SL tablet Place 1 tablet (0.4 mg total) under the tongue every 5 (five) minutes as needed for chest pain. Daily max- 3 doses 50 tablet 1  . pantoprazole (PROTONIX) 40 MG tablet Take 1 tablet (40 mg total) by mouth daily. 30 tablet 3  . rivaroxaban (XARELTO) 20 MG TABS tablet Take 1 tablet (20 mg total) by mouth daily with supper. 90 tablet 3  . traMADol (ULTRAM) 50 MG tablet Take 1 tablet (50 mg total) by mouth every 8 (eight) hours as needed. 60 tablet 0  . albuterol (PROVENTIL) (2.5 MG/3ML) 0.083% nebulizer solution Take 3 mLs (2.5 mg total) by nebulization every 6 (six) hours as needed for wheezing or shortness of breath. (Patient not taking: Reported on 06/18/2015) 150 mL 1  . gabapentin (NEURONTIN) 300 MG capsule Take 1 capsule (300 mg total) by mouth 3 (three) times daily. (Patient not taking: Reported on 06/18/2015) 90 capsule 3   No facility-administered medications prior to visit.    ROS Review of Systems  Constitutional: Negative for fever, chills, fatigue and unexpected weight change.  Eyes: Negative for visual disturbance.  Respiratory: Positive for shortness of breath. Negative for cough.  Cardiovascular: Positive for chest pain. Negative for palpitations and leg swelling.  Gastrointestinal: Negative for nausea, vomiting, abdominal pain, diarrhea, constipation and blood in stool.  Endocrine: Negative for polydipsia, polyphagia and polyuria.  Musculoskeletal: Positive for back pain and gait problem. Negative for myalgias, arthralgias and neck pain.  Skin: Negative for rash.  Allergic/Immunologic: Negative for immunocompromised state.  Hematological: Negative for adenopathy. Does not bruise/bleed easily.  Psychiatric/Behavioral: Negative for suicidal ideas, sleep disturbance and dysphoric mood. The patient is  nervous/anxious.     Objective:  BP 117/75 mmHg  Pulse 72  Temp(Src) 98 F (36.7 C) (Oral)  Resp 20  Ht 5\' 11"  (1.803 m)  Wt 160 lb (72.576 kg)  BMI 22.33 kg/m2  SpO2 99%  BP/Weight 06/18/2015 06/06/2015 123XX123  Systolic BP 123XX123 123XX123 [id number]  Diastolic BP 75 72 65  Wt. (Lbs) 160 171.2 174  BMI 22.33 24.56 24.97   Physical Exam  Constitutional: He appears well-developed and well-nourished. No distress.  HENT:  Head: Normocephalic and atraumatic.  Neck: Normal range of motion. Neck supple.  Cardiovascular: Normal rate, regular rhythm and intact distal pulses.  Exam reveals gallop.   Pulmonary/Chest: Effort normal and breath sounds normal.  Musculoskeletal: He exhibits no edema.  Neurological: He is alert.  Skin: Skin is warm and dry. No rash noted. No erythema.  Psychiatric: He has a normal mood and affect.   Depression screen [facility] 2/9 06/18/2015 06/06/2015 05/04/2015  Decreased Interest 1 3 3   Down, Depressed, Hopeless 3 3 3   PHQ - 2 Score 4 6 6   Altered sleeping 3 3 2   Tired, decreased energy 3 3 2   Change in appetite 3 3 3   Feeling bad or failure about yourself  3 3 1   Trouble concentrating 3 3 3   Moving slowly or fidgety/restless 2 3 1   Suicidal thoughts 1 0 -  PHQ-9 Score 22 24 18     GAD 7 : Generalized Anxiety Score 06/18/2015 05/04/2015  Nervous, Anxious, on Edge 2 2  Control/stop worrying 3 2  Worry too much - different things 3 2  Trouble relaxing 2 2  Restless 2 1  Easily annoyed or irritable 3 2  Afraid - awful might happen 3 -  Total GAD 7 Score 18 -    Lab Results  Component Value Date   HGBA1C 5.40 06/06/2015   INTERPRETATION 1. Normal left ventricular size and function. LVEF calculated at 62%.   2. Right ventricle is mildly dilated with normal systolic function. RVEF calculated at 47%.     3. Bicupsid aortic valve present with fusion of the right and left coronary cusps with out visual evidence of significant obstruction or regurgitation. Patient  had prior cardiac surgery. Details unknown. 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 Aorta has narrowing at the site of  Left subclavian origin. 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 Might have had a graft. There is mild narrowing of the aorta at the site of left subclavian origin (12 mm)   and again there is narrowing and measures 15 mm. There is post stenotic   dilation of the decending aorta measuring 27 mm in diameter. Left   subclavian origin not visualized. Ratio of proximal descnding thoracic   aortic stenosis and descending aorta is 0.57. Ascending aorta flow   volume is 39 ml and descending aorta flow is 6ml consistent with   possible repair of prior aortic coarctation or interruption. Overall   appearance may  be consistent with prior aortic arch repair given   history of congential cardiac surgery.   Overall this suggest no hemodynamic significant stenosis   4. Pulmonary valve appears normal. Mild pulmonary regurgitation. There   is dilation of the pulmonary valve annulus (37 mm) and distal RV   outflow tract ( 33 mm) with dilated proximal PA (41 mm) followed by   narrowing of the main pulmonary artery (22). No significant pulmonary   regurgitantion. There is mild narrowing of the proximal left pulmonary   artery (72mm). Right pulmonary artery measures 65mm.   5. Moderately dilated right atrium.   6. No pericardial effusion.     Conclusions:   Normal left ventricular size and function.   Mildly dilated right ventricle with normal systolic function   Dilated distal RVOT   Normal PV with no pulmonary regurgition   There is dilation of main PA followed by narrowing of PA   Left branch PA stenosis. [< 50%]   Bicuspid aortic valve is present with segmental narrowing and post   stenotic dilation of the aortic arch without hemodynamic significant   obstruction could be consistent with prior aortic arch  repair.   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  Diagnoses and all orders for this visit:  Chronic low back pain -     Ambulatory referral to Pain Clinic  Other emphysema (HCC) -     albuterol (PROVENTIL) (2.5 MG/3ML) 0.083% nebulizer solution; Take 3 mLs (2.5 mg total) by nebulization every 6 (six) hours as needed for wheezing or shortness of breath.  Current smoker  Atypical chest pain  Essential hypertension    Follow-up: No Follow-up on file.   Boykin Nearing MD

## 2015-06-18 NOTE — Assessment & Plan Note (Signed)
A; BP well controlled on 20 mg of norvasc P: Patient counseled to take 10 mg of norvasc

## 2015-06-18 NOTE — Progress Notes (Signed)
ASSESSMENT: Pt currently experiencing symptoms of anxiety and depression, needs to f/u with PCP and [facility], and establish [place] care with psychiatry; pt would benefit from brief therapy to cope with symptoms of anxiety and depression.  Stage of Change: contemplative  PLAN: 1. F/U with behavioral health consultant in one month 2. Psychiatric Medications: none 3. Behavioral recommendation(s):   -Continue to take walks as needed -Consider practicing daily meditation again -Consider daily walks prior to becoming irritated -Go to [facility] in [facility] to re-establish psychiatric care  SUBJECTIVE: Pt. referred by Dr Adrian Blackwater for symptoms of anxiety and depression:  Pt. reports the following symptoms/concerns: Pt states that he has an appointment with disability tomorrow, that he did not turn in all paperwork in timely fashion prior to disability denial last time; primary concern today is overwhelming life stressors making him feel depressed and anxious. His most bothersome symptom is becoming easily irritated; he used to practice meditation, and currently takes a walk to calm down when irritated. He used to go to [employer], but stopped and is thinking of going again; he feels better to talk about stressors. Duration of problem: More than six months Severity: severe  OBJECTIVE: Orientation & Cognition: Oriented x3. Thought processes normal and appropriate to situation. Mood: appropriate. Affect: appropriate Appearance: appropriate Risk of harm to self or others: no known risk of harm to self or others Substance use: none Assessments administered: PHQ9: 23/ GAD7: 18  Diagnosis: Anxiety and depression CPT Code: F41.8 -------------------------------------------- Other(s) present in the room: none  Time spent with patient in exam room: 30  Minutes, 11:00-11:30am    Depression screen [facility] 2/9 06/18/2015 06/06/2015 05/04/2015 01/05/2015 07/05/2014  Decreased Interest 1 3 3 3  0  Down, Depressed, Hopeless 3 3 3 3 1    PHQ - 2 Score 4 6 6 6 1   Altered sleeping 3 3 2 3  -  Tired, decreased energy 3 3 2 3  -  Change in appetite 3 3 3 2  -  Feeling bad or failure about yourself  3 3 1 3  -  Trouble concentrating 3 3 3 2  -  Moving slowly or fidgety/restless 2 3 1 1  -  Suicidal thoughts 1 0 - 0 -  PHQ-9 Score 22 24 18 20  -    GAD 7 : Generalized Anxiety Score 06/18/2015 05/04/2015  Nervous, Anxious, on Edge 2 2  Control/stop worrying 3 2  Worry too much - different things 3 2  Trouble relaxing 2 2  Restless 2 1  Easily annoyed or irritable 3 2  Afraid - awful might happen 3 -  Total GAD 7 Score 18 -

## 2015-06-18 NOTE — Assessment & Plan Note (Signed)
Pain management referral placed Note routed to referral coordinator to pass on patient's inquiry about less expensive neurosurgery

## 2015-06-18 NOTE — Assessment & Plan Note (Signed)
A: persistent pain not improved with NTG, pain medicine or recent dc of clonidine and replacement with amlodipine. GI etiology vs anxiety likely  P:  GI appt pending

## 2015-06-18 NOTE — Progress Notes (Signed)
F/U Chest pain, SHOB and back pain  No changes since last visit  Pain scale #7 Former smoker  No suicidal thoughts in the past two weeks

## 2015-06-18 NOTE — Assessment & Plan Note (Signed)
Albuterol nebulizer provided to patient

## 2015-06-18 NOTE — Patient Instructions (Addendum)
Kendle was seen today for follow-up and chest pain.  Diagnoses and all orders for this visit:  Chronic low back pain -     Ambulatory referral to Pain Clinic   Referral to pain management placed Note sent to referral coordinator about neurosurgery referral and if there are any other providers who require a less than $50 co-pay.  Take norvasc 10 mg once daily, I have combined the blue and white pills   Keep GI appt scheduled for next month  Call for tramadol refill when due   F/u with me for HTN in 3 months   Dr. Adrian Blackwater

## 2015-06-27 MED FILL — XARELTO 20 MG TABLET: 20 | 30 days supply | Qty: 30 | Fill #11

## 2015-07-06 ENCOUNTER — Encounter: Payer: Self-pay | Admitting: Clinical

## 2015-07-06 NOTE — Progress Notes (Signed)
Depression screen [facility] 2/9 06/18/2015 06/06/2015 05/04/2015 01/05/2015 07/05/2014  Decreased Interest 1 3 3 3  0  Down, Depressed, Hopeless 3 3 3 3 1   PHQ - 2 Score 4 6 6 6 1   Altered sleeping 3 3 2 3  -  Tired, decreased energy 3 3 2 3  -  Change in appetite 3 3 3 2  -  Feeling bad or failure about yourself  3 3 1 3  -  Trouble concentrating 3 3 3 2  -  Moving slowly or fidgety/restless 2 3 1 1  -  Suicidal thoughts 1 0 - 0 -  PHQ-9 Score 22 24 18 20  -   *01-16-15 PHQ9: 17/ GAD7: 18 *06-06-15 GAD7: 21  **Pt writes on 06-06-15 "sometimes really depressed" (NO thoughts of harm to self)  GAD 7 : Generalized Anxiety Score 06/18/2015 05/04/2015  Nervous, Anxious, on Edge 2 2  Control/stop worrying 3 2  Worry too much - different things 3 2  Trouble relaxing 2 2  Restless 2 1  Easily annoyed or irritable 3 2  Afraid - awful might happen 3 -  Total GAD 7 Score 18 -

## 2015-07-18 ENCOUNTER — Encounter: Payer: Self-pay | Admitting: Internal Medicine

## 2015-07-18 ENCOUNTER — Ambulatory Visit (INDEPENDENT_AMBULATORY_CARE_PROVIDER_SITE_OTHER): Payer: Self-pay | Admitting: Internal Medicine

## 2015-07-18 VITALS — BP 120/50 | HR 60 | Ht 69.0 in | Wt 176.5 lb

## 2015-07-18 DIAGNOSIS — R1310 Dysphagia, unspecified: Secondary | ICD-10-CM

## 2015-07-18 DIAGNOSIS — K625 Hemorrhage of anus and rectum: Secondary | ICD-10-CM

## 2015-07-18 DIAGNOSIS — R0789 Other chest pain: Secondary | ICD-10-CM

## 2015-07-18 DIAGNOSIS — R079 Chest pain, unspecified: Secondary | ICD-10-CM

## 2015-07-18 DIAGNOSIS — K219 Gastro-esophageal reflux disease without esophagitis: Secondary | ICD-10-CM

## 2015-07-18 DIAGNOSIS — K5901 Slow transit constipation: Secondary | ICD-10-CM

## 2015-07-18 MED ORDER — PANTOPRAZOLE SODIUM 40 MG PO TBEC: 40.0000 mg | DELAYED_RELEASE_TABLET | Freq: Two times a day (BID) | ORAL | Status: DC

## 2015-07-18 NOTE — Patient Instructions (Signed)
We have sent the following medications to your pharmacy for you to pick up at your convenience:  Omeprazole  You have been scheduled for a Barium Esophogram at [facility] (1st floor of the hospital) on 08/06/2015 at 9:30am. Please arrive 15 minutes prior to your appointment for registration. Make certain not to have anything to eat or drink 6 hours prior to your test. If you need to reschedule for any reason, please contact radiology at [phone] to do so. __________________________________________________________________ A barium swallow is an examination that concentrates on views of the esophagus. This tends to be a double contrast exam (barium and two liquids which, when combined, create a gas to distend the wall of the oesophagus) or single contrast (non-ionic iodine based). The study is usually tailored to your symptoms so a good history is essential. Attention is paid during the study to the form, structure and configuration of the esophagus, looking for functional disorders (such as aspiration, dysphagia, achalasia, motility and reflux) EXAMINATION You may be asked to change into a gown, depending on the type of swallow being performed. A radiologist and radiographer will perform the procedure. The radiologist will advise you of the type of contrast selected for your procedure and direct you during the exam. You will be asked to stand, sit or lie in several different positions and to hold a small amount of fluid in your mouth before being asked to swallow while the imaging is performed .In some instances you may be asked to swallow barium coated marshmallows to assess the motility of a solid food bolus. The exam can be recorded as a digital or video fluoroscopy procedure. POST PROCEDURE It will take 1-2 days for the barium to pass through your system. To facilitate this, it is important, unless otherwise directed, to increase your fluids for the next 24-48hrs and to resume your normal  diet.  This test typically takes about 30 minutes to perform. __________________________________________________________________________________    Jeremy Booker may use Miralax daily - 1 capful in juice or water

## 2015-07-18 NOTE — Progress Notes (Signed)
HISTORY OF PRESENT ILLNESS:  Jeremy Booker is a 50 y.o. male with multiple medical problems as listed below as well as a history of polysubstance abuse who is sent by his primary care provider Dr. Adrian Booker regarding atypical chest pain (question GI). Patient has a significant cardiac, and pulmonary history including pulmonary embolus had open-heart surgery as a child. He is on anticoagulation chronically. Has had chronic problems with chest pain. Has had cardiology evaluation with noncardiac etiology thought likely. Patient tells me that he does have a history of indigestion and heartburn. Placed on pantoprazole 2 months ago. States this does not help. Occasional dysphagia. He is a vague historian. He has had no weight loss. He has had significant constipation which bothers him. He does take tramadol regularly for chronic back pain. He also has history of hemorrhoids with blood on the tissue when inflamed. No other abdominal complaints.. Outside labs reviewed. Unremarkable including normal hemoglobin 15.2  REVIEW OF SYSTEMS:  All non-GI ROS negative except for sinus allergy trouble, anxiety, back pain, visual change, confusion, cough, depression, fatigue, heart murmur, night sweats, shows of breath, sleeping problems, ankle edema, increased thirst  Past Medical History  Diagnosis Date  . COPD (chronic obstructive pulmonary disease) ([place])   . Hypertension        . Tobacco abuse     a. 30 pack years. Quit   . ETOH abuse     a. 06/2014 currently a few bottles of wine/week.  . Drug abuse     a. 06/2014 Cocaine/Marijuana - last used a few months ago.  . Congenital heart disease     a. thinks he had "a hole in my heart" s/p surgical correction @ age 61 and then age 54.  . Chest pain     a. 2010 s/p cath in [place], [place] - reportedly nl.  . Pulmonary embolism ([place])   . Anxiety   . CAD (coronary artery disease)   . Depression     Past Surgical History  Procedure Laterality Date  . Cardiac surgery       congenital   . Appendectomy    . Tonsillectomy      Social History [name]  reports that he has been smoking Cigarettes.  He has been smoking about 0.00 packs per day for the past 30 years. He has never used smokeless tobacco. He reports that he uses illicit drugs (Cocaine and Marijuana). He reports that he does not drink alcohol.  family history includes Cancer in his brother and maternal uncle; Clotting disorder in his mother; Heart disease in his maternal uncle and sister; Hypertension in his maternal uncle and mother; Other in his father. There is no history of Heart attack or Stroke.  Allergies  Allergen Reactions  . Buprenorphine Hcl Shortness Of Breath  . Morphine And Related Shortness Of Breath  . Baclofen   . Dilaudid [Hydromorphone Hcl] Itching  . Morphine Palpitations  . Naprosyn [Naproxen] Itching and Rash    Patient has taken ibuprofen and tolerates that just fine Patient has taken ibuprofen and tolerates that just fine       PHYSICAL EXAMINATION: Vital signs: BP 120/50 mmHg  Pulse 60  Ht 5\' 9"  (1.753 m)  Wt 176 lb 8 oz (80.06 kg)  BMI 26.05 kg/m2  Constitutional: generally well-appearing, no acute distress Psychiatric: alert and oriented x3, cooperative Eyes: extraocular movements intact, anicteric, conjunctiva pink Ears: Superior portion of right ear missing Mouth: oral pharynx moist, no lesions. No thrush Neck:  supple without thyromegaly Lymph: no lymphadenopathy Cardiovascular: heart regular rate and rhythm, no murmur. Median sternotomy scar well-healed Lungs: clear to auscultation bilaterally Abdomen: soft, nontender, nondistended, no obvious ascites, no peritoneal signs, normal bowel sounds, no organomegaly. Abdominal scars well-healed. No hernia Rectal:No external/internal abnormalities. Hemoccult negative stool Extremities: no lower extremity edema bilaterally Skin: no lesions on visible extremities Neuro: No focal deficits. No  asterixis.    ASSESSMENT:  #1. Chronic chest pain. Etiology uncertain #2. Possible GERD. If so refractory to once daily PPI #3. Vague dysphagia. Needs evaluated #4. Chronic constipation #5. Trivial rectal bleeding by history. Negative rectal exam. Normal hemoglobin 15.2 #6. Multiple significant medical problems #7. History of substance abuse. Multiple   PLAN:  #1. Reflux precautions #2. Increase pantoprazole to 40 mg twice daily. Prescribed. If no change in chest pain, consider noncardiac, non-GI etiologies #3. Schedule barium swallow with tablet to evaluate vague dysphagia and chest pain. We will contact him with the results. #4. Recommend MiraLAX for constipation. Described to him the proper way to titrate A copy has been sent to Dr. Adrian Booker

## 2015-07-31 ENCOUNTER — Other Ambulatory Visit: Payer: Self-pay | Admitting: Family Medicine

## 2015-07-31 DIAGNOSIS — G8929 Other chronic pain: Secondary | ICD-10-CM

## 2015-07-31 DIAGNOSIS — M545 Low back pain, unspecified: Secondary | ICD-10-CM

## 2015-07-31 NOTE — Telephone Encounter (Signed)
Pt notified Rx at front office ready to be pickup 

## 2015-08-06 ENCOUNTER — Ambulatory Visit (HOSPITAL_COMMUNITY): Admission: RE | Admit: 2015-08-06 | Payer: Self-pay | Source: Ambulatory Visit

## 2015-08-21 ENCOUNTER — Telehealth: Payer: Self-pay | Admitting: Clinical

## 2015-08-21 NOTE — Telephone Encounter (Signed)
Attempt termination with pt; left HIPPA-compliant message to call back Roselyn Reef at [facility] at [phone], prior to end of April.

## 2015-08-27 ENCOUNTER — Other Ambulatory Visit: Payer: Self-pay | Admitting: Internal Medicine

## 2015-08-27 DIAGNOSIS — I2699 Other pulmonary embolism without acute cor pulmonale: Secondary | ICD-10-CM

## 2015-08-27 MED ORDER — RIVAROXABAN 20 MG PO TABS: 20.0000 mg | ORAL_TABLET | Freq: Every day | ORAL | Status: DC

## 2015-11-08 ENCOUNTER — Other Ambulatory Visit: Payer: Self-pay | Admitting: Family Medicine

## 2015-11-09 ENCOUNTER — Telehealth: Payer: Self-pay

## 2015-11-09 NOTE — Telephone Encounter (Signed)
Left Voicemail advising "request is ready for pick-up". Priscille Heidelberg, RN, BSN

## 2015-11-16 MED FILL — GABAPENTIN 300 MG CAPSULE: 300 | 30 days supply | Qty: 90 | Fill #2

## 2015-11-16 MED FILL — ?ALBUTEROL SUL 2.5 MG/3 MLS: (2.5 MG/3ML | 30 days supply | Qty: 90 | Fill #1

## 2015-11-16 MED FILL — ?AMLODIPINE BESYLATE 10 MG: 10 | 30 days supply | Qty: 30 | Fill #1

## 2015-11-16 MED FILL — !VENTOLIN HFA INHALER: 108 (90 BAS | 30 days supply | Qty: 18 | Fill #3

## 2015-11-16 MED FILL — XARELTO 20 MG TABLET: 20 | 30 days supply | Qty: 30 | Fill #0

## 2015-11-16 MED FILL — ?PANTOPRAZOLE SOD DR 40MG: 40 MG | 30 days supply | Qty: 60 | Fill #0

## 2015-11-20 ENCOUNTER — Ambulatory Visit: Payer: Self-pay | Admitting: Family Medicine

## 2015-11-29 ENCOUNTER — Encounter: Payer: Self-pay | Admitting: Family Medicine

## 2015-11-29 ENCOUNTER — Ambulatory Visit: Payer: Self-pay | Attending: Family Medicine | Admitting: Family Medicine

## 2015-11-29 VITALS — BP 126/76 | HR 51 | Temp 98.3°F | Ht 69.0 in | Wt 167.0 lb

## 2015-11-29 DIAGNOSIS — M545 Low back pain: Secondary | ICD-10-CM

## 2015-11-29 DIAGNOSIS — J438 Other emphysema: Secondary | ICD-10-CM

## 2015-11-29 DIAGNOSIS — G8929 Other chronic pain: Secondary | ICD-10-CM

## 2015-11-29 MED ORDER — METHOCARBAMOL 500 MG PO TABS: 500.0000 mg | ORAL_TABLET | Freq: Three times a day (TID) | ORAL | Status: DC | PRN

## 2015-11-29 MED FILL — METHOCARBAMOL 500 MG TABLET: 500 | 20 days supply | Qty: 60 | Fill #0
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
## 2015-11-29 NOTE — Assessment & Plan Note (Signed)
COPD with chronic dyspnea. No distress or hypoxemia to suggest acute exacerbation.   Plan: Restart symbicort Albuterol prn  Establish with pulm

## 2015-11-29 NOTE — Assessment & Plan Note (Signed)
Chronic low back pain with intermittent radicular symptoms  Plan: Restart gabapentin, tramadol Add robaxin Pain management referral

## 2015-11-29 NOTE — Patient Instructions (Addendum)
Anurag was seen today for copd and shortness of breath.  Diagnoses and all orders for this visit:  Chronic low back pain -     methocarbamol (ROBAXIN) 500 MG tablet; Take 1 tablet (500 mg total) by mouth every 8 (eight) hours as needed for muscle spasms. -     Ambulatory referral to Pain Clinic  Other emphysema ([place])    Restart all medications  F/u here with Dr. Melvyn Novas, pulmonology

## 2015-12-11 ENCOUNTER — Telehealth: Payer: Self-pay | Admitting: Family Medicine

## 2015-12-11 NOTE — Telephone Encounter (Signed)
Pt called requesting medication for rash, please f/up

## 2015-12-11 NOTE — Telephone Encounter (Signed)
Will forward to pcp

## 2015-12-12 NOTE — Telephone Encounter (Signed)
Chadelle if you could please call patient and schedule an appointment please and thank you

## 2015-12-12 NOTE — Telephone Encounter (Signed)
He needs a visit for assessment of the rash

## 2015-12-25 ENCOUNTER — Ambulatory Visit: Payer: Self-pay | Admitting: Family Medicine

## 2016-01-08 MED FILL — GABAPENTIN 300 MG CAPSULE: 300 | 30 days supply | Qty: 90 | Fill #3

## 2016-01-08 MED FILL — AMLODIPINE BESYLATE 10 MG T: 10 | 30 days supply | Qty: 30 | Fill #2

## 2016-01-21 ENCOUNTER — Telehealth: Payer: Self-pay | Admitting: Family Medicine

## 2016-01-21 NOTE — Telephone Encounter (Signed)
Pt. Called requesting a refill on Tramadol. Please f/u °

## 2016-01-22 MED ORDER — TRAMADOL HCL 50 MG PO TABS: 50.0000 mg | ORAL_TABLET | Freq: Three times a day (TID) | ORAL | 0 refills | Status: DC | PRN

## 2016-01-22 NOTE — Telephone Encounter (Signed)
Pt was called but VM was not set up. Script will be in the book. Pt is to sign controlled substance agreement.

## 2016-01-22 NOTE — Telephone Encounter (Signed)
Tramadol ready for pick up Patient to sign controlled substance contract

## 2016-01-25 ENCOUNTER — Other Ambulatory Visit: Payer: Self-pay | Admitting: Family Medicine

## 2016-01-25 DIAGNOSIS — J438 Other emphysema: Secondary | ICD-10-CM

## 2016-01-25 MED FILL — METHOCARBAMOL 500 MG TABLET: 500 | 20 days supply | Qty: 60 | Fill #1

## 2016-01-25 MED FILL — **XARELTO 20 MG TABLET: 20 MG | 5 days supply | Qty: 5 | Fill #1

## 2016-01-25 MED FILL — AMLODIPINE BESYLATE 10 MG T: 10 | 30 days supply | Qty: 30 | Fill #3

## 2016-01-25 MED FILL — VENTOLIN HFA 90 MCG INHALER: 108 (90 BAS | 30 days supply | Qty: 18 | Fill #0

## 2016-01-25 MED FILL — ?PANTOPRAZOLE SOD DR 40MG: 40 MG | 30 days supply | Qty: 60 | Fill #1

## 2016-01-27 DIAGNOSIS — F1910 Other psychoactive substance abuse, uncomplicated: Secondary | ICD-10-CM | POA: Insufficient documentation

## 2016-01-28 ENCOUNTER — Ambulatory Visit: Payer: Self-pay | Admitting: Family Medicine

## 2016-02-06 ENCOUNTER — Other Ambulatory Visit: Payer: Self-pay | Admitting: Family Medicine

## 2016-02-06 NOTE — Telephone Encounter (Signed)
Pt. Called stating that he was giving a Rx for Tramadol and he states that when he went to the hospital he left the Rx and needs a new one. Please f/u

## 2016-02-12 NOTE — Telephone Encounter (Signed)
New tramadol Rx ready for pick up

## 2016-02-12 NOTE — Addendum Note (Signed)
Addended by: Boykin Nearing on: 02/12/2016 07:30 PM   Modules accepted: Orders

## 2016-02-12 NOTE — Telephone Encounter (Signed)
New Rx for tramdol pended and sent to Dr. Doreene Burke to renew on my behalf

## 2016-02-13 ENCOUNTER — Telehealth: Payer: Self-pay

## 2016-02-13 MED ORDER — TRAMADOL HCL 50 MG PO TABS: 50.0000 mg | ORAL_TABLET | Freq: Three times a day (TID) | ORAL | 0 refills | Status: DC | PRN

## 2016-02-13 MED FILL — VENTOLIN HFA 90 MCG INHALER: 108 (90 BAS | 30 days supply | Qty: 18 | Fill #1

## 2016-02-13 MED FILL — **XARELTO 20 MG TABLET: 20 MG | 5 days supply | Qty: 5 | Fill #2

## 2016-02-13 NOTE — Telephone Encounter (Signed)
Pt was called on 10/4 and informed of script for tramadol being ready for pick up.

## 2016-02-15 ENCOUNTER — Inpatient Hospital Stay: Payer: Self-pay | Admitting: Family Medicine

## 2016-02-18 ENCOUNTER — Encounter: Payer: Self-pay | Admitting: Family Medicine

## 2016-02-18 ENCOUNTER — Other Ambulatory Visit: Payer: Self-pay | Admitting: Family Medicine

## 2016-02-18 ENCOUNTER — Other Ambulatory Visit: Payer: Self-pay

## 2016-02-18 ENCOUNTER — Ambulatory Visit: Payer: Self-pay | Attending: Family Medicine | Admitting: Family Medicine

## 2016-02-18 VITALS — BP 120/78 | HR 69 | Temp 97.9°F | Ht 69.0 in | Wt 160.8 lb

## 2016-02-18 DIAGNOSIS — Z9049 Acquired absence of other specified parts of digestive tract: Secondary | ICD-10-CM | POA: Insufficient documentation

## 2016-02-18 DIAGNOSIS — F1110 Opioid abuse, uncomplicated: Secondary | ICD-10-CM | POA: Insufficient documentation

## 2016-02-18 DIAGNOSIS — R079 Chest pain, unspecified: Secondary | ICD-10-CM | POA: Insufficient documentation

## 2016-02-18 DIAGNOSIS — Z87891 Personal history of nicotine dependence: Secondary | ICD-10-CM | POA: Insufficient documentation

## 2016-02-18 DIAGNOSIS — G8929 Other chronic pain: Secondary | ICD-10-CM | POA: Insufficient documentation

## 2016-02-18 DIAGNOSIS — G4700 Insomnia, unspecified: Secondary | ICD-10-CM | POA: Insufficient documentation

## 2016-02-18 DIAGNOSIS — R1030 Lower abdominal pain, unspecified: Secondary | ICD-10-CM | POA: Insufficient documentation

## 2016-02-18 DIAGNOSIS — F1411 Cocaine abuse, in remission: Secondary | ICD-10-CM

## 2016-02-18 DIAGNOSIS — Z9889 Other specified postprocedural states: Secondary | ICD-10-CM | POA: Insufficient documentation

## 2016-02-18 DIAGNOSIS — Z87898 Personal history of other specified conditions: Secondary | ICD-10-CM

## 2016-02-18 DIAGNOSIS — Z7901 Long term (current) use of anticoagulants: Secondary | ICD-10-CM | POA: Insufficient documentation

## 2016-02-18 DIAGNOSIS — Q249 Congenital malformation of heart, unspecified: Secondary | ICD-10-CM

## 2016-02-18 DIAGNOSIS — I2699 Other pulmonary embolism without acute cor pulmonale: Secondary | ICD-10-CM | POA: Insufficient documentation

## 2016-02-18 DIAGNOSIS — F1410 Cocaine abuse, uncomplicated: Secondary | ICD-10-CM | POA: Insufficient documentation

## 2016-02-18 DIAGNOSIS — I480 Paroxysmal atrial fibrillation: Secondary | ICD-10-CM | POA: Insufficient documentation

## 2016-02-18 DIAGNOSIS — I4891 Unspecified atrial fibrillation: Secondary | ICD-10-CM | POA: Insufficient documentation

## 2016-02-18 DIAGNOSIS — Z86711 Personal history of pulmonary embolism: Secondary | ICD-10-CM | POA: Insufficient documentation

## 2016-02-18 DIAGNOSIS — R0789 Other chest pain: Secondary | ICD-10-CM

## 2016-02-18 DIAGNOSIS — I10 Essential (primary) hypertension: Secondary | ICD-10-CM

## 2016-02-18 MED ORDER — ALBUTEROL SULFATE (2.5 MG/3ML) 0.083% IN NEBU: 2.5000 mg | INHALATION_SOLUTION | Freq: Once | RESPIRATORY_TRACT | Status: DC

## 2016-02-18 MED ORDER — ZOLPIDEM TARTRATE 5 MG PO TABS: 5.0000 mg | ORAL_TABLET | Freq: Every evening | ORAL | 0 refills | Status: DC | PRN

## 2016-02-18 MED ORDER — RIVAROXABAN 20 MG PO TABS: 20.0000 mg | ORAL_TABLET | Freq: Every day | ORAL | 3 refills | Status: DC

## 2016-02-18 MED ORDER — ACETAMINOPHEN-CODEINE #4 300-60 MG PO TABS: 1.0000 | ORAL_TABLET | Freq: Two times a day (BID) | ORAL | 0 refills | Status: DC | PRN

## 2016-02-18 MED FILL — PANTOPRAZOLE SOD DR 40 MG T: 40 | 30 days supply | Qty: 60 | Fill #2

## 2016-02-18 MED FILL — AMLODIPINE BESYLATE 10 MG T: 10 | 30 days supply | Qty: 30 | Fill #0

## 2016-02-18 MED FILL — METHOCARBAMOL 500 MG TABLET: 500 | 20 days supply | Qty: 60 | Fill #2

## 2016-02-18 NOTE — Patient Instructions (Addendum)
Jeremy Booker was seen today for hospitalization follow-up.  Diagnoses and all orders for this visit:  History of cocaine abuse -     Pain Mgmt, Profile 1 w/o Conf, U  Other acute pulmonary embolism without acute cor pulmonale (HCC) -     Discontinue: rivaroxaban (XARELTO) 20 MG TABS tablet; Take 1 tablet (20 mg total) by mouth daily with supper. -     rivaroxaban (XARELTO) 20 MG TABS tablet; Take 1 tablet (20 mg total) by mouth daily with supper.  Chronic chest pain -     Discontinue: acetaminophen-codeine (TYLENOL #4) 300-60 MG tablet; Take 1 tablet by mouth 2 (two) times daily as needed for moderate pain. -     acetaminophen-codeine (TYLENOL #4) 300-60 MG tablet; Take 1 tablet by mouth 2 (two) times daily as needed for moderate pain.  Lower abdominal pain -     Discontinue: acetaminophen-codeine (TYLENOL #4) 300-60 MG tablet; Take 1 tablet by mouth 2 (two) times daily as needed for moderate pain. -     acetaminophen-codeine (TYLENOL #4) 300-60 MG tablet; Take 1 tablet by mouth 2 (two) times daily as needed for moderate pain.  Paroxysmal atrial fibrillation ([place]) -     Ambulatory referral to Cardiology  Insomnia, unspecified type -     zolpidem (AMBIEN) 5 MG tablet; Take 1 tablet (5 mg total) by mouth at bedtime as needed for sleep.  letter will be mailed to your home  F/u in 6 weeks  Dr. Adrian Blackwater

## 2016-02-18 NOTE — Progress Notes (Signed)
PAIN LEVEL IS 10 FOR NECK BACK AND CHEST.  Pt declined flu shot.

## 2016-02-18 NOTE — Progress Notes (Signed)
Subjective:  Patient ID: Jeremy Booker, male    DOB: 1966/02/11  Age: 50 y.o. 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  1. Cecal mass and abdominal pain: patient underwent colonoscopy 01/29/3016 with polypectomy. 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 A cecal biopsy was done that reflected ischemia without malignancy or adenoma. On 01/31/2016 he underwent R sided hemicolectomy the pathology confirmed ischemia without malignancy or adenoma. He was discharged on oxycodone 10 mg for post op pain.   He has been seen in follow up by his surgeon on 02/08/16. His wound is healing well.  He was  prescribed Vicodin for his pain. He is out of Vicodin. He is now taking tramadol for pain control. He denies cocaine and alcohol use. He reports pain in lower abdomen after eating solids and at night. No vomiting or blood in stool.   2. A fib without RVR: noted on EKG 01/27/16. This was new in setting of cocaine abuse. He was started on a diltiazem and converted back to sinus rhythm on 01/27/16. He did not require oral diltiazem. Beta blocker was not started due to recent cocaine use. He had and 2 D ECHO that revealed normal EF, indeterminate diastolic function. He patient reports he experience cardiac arrest and multiple heart attacks during his hospitalization, but there is  no record of this in the discharge summary or anywhere else in the hospital records.   3. R lower lobe PE: multiple RLL PEs noted on CT angiogram done on 01/27/16. This was in setting of hospitalization. He has hx of PE in R lower lung diagnosed in 07/02/2014 and was prescribed xarelto for anticoagulation but was not compliant for two weeks prior to his hospitalization.   Social History  Substance Use Topics  . Smoking status: Former Smoker    Packs/day: 0.00    Years: 30.00    Types: Cigarettes    Quit date: 09/29/2015  . Smokeless tobacco: Never Used  . Alcohol use No     Comment: Previously drank heavily on a daily basis.  Now drinks a few bottles of wine 1-2 x /wk (06/2014)    Outpatient Medications Prior to Visit  Medication Sig Dispense Refill  . albuterol (PROVENTIL) (2.5 MG/3ML) 0.083% nebulizer solution Take 3 mLs (2.5 mg total) by nebulization every 6 (six) hours as needed for wheezing or shortness of breath. (Patient not taking: Reported on 11/29/2015) 150 mL 1  . amLODipine (NORVASC) 10 MG tablet TAKE 1 TABLET BY MOUTH DAILY. 30 tablet 3  . budesonide-formoterol (SYMBICORT) 80-4.5 MCG/ACT inhaler Inhale 2 puffs into the lungs 2 (two) times daily. (Patient not taking: Reported on 11/29/2015) 1 Inhaler 3  . gabapentin (NEURONTIN) 300 MG capsule Take 1 capsule (300 mg total) by mouth 3 (three) times daily. (Patient not taking: Reported on 11/29/2015) 90 capsule 3  . methocarbamol (ROBAXIN) 500 MG tablet Take 1 tablet (500  mg total) by mouth every 8 (eight) hours as needed for muscle spasms. 60 tablet 3  . nitroGLYCERIN (NITROSTAT) 0.4 MG SL tablet Place 1 tablet (0.4 mg total) under the tongue every 5 (five) minutes as needed for chest pain. Daily max- 3 doses (Patient not taking: Reported on 11/29/2015) 50 tablet 1  . pantoprazole (PROTONIX) 40 MG tablet Take 1 tablet (40 mg total) by mouth 2 (two) times daily. (Patient not taking: Reported on 11/29/2015) 60 tablet 6  . rivaroxaban  (XARELTO) 20 MG TABS tablet Take 1 tablet (20 mg total) by mouth daily with supper. (Patient not taking: Reported on 11/29/2015) 90 tablet 3  . traMADol (ULTRAM) 50 MG tablet Take 1 tablet (50 mg total) by mouth every 8 (eight) hours as needed. 60 tablet 0  . VENTOLIN HFA 108 (90 Base) MCG/ACT inhaler INHALE 2 PUFFS INTO THE LUNGS EVERY 6 HOURS AS NEEDED FOR WHEEZING OR SHORTNESS OF BREATH. 18 g 1   No facility-administered medications prior to visit.     ROS Review of Systems  Constitutional: Negative for chills, fatigue, fever and unexpected weight change.  Eyes: Negative for visual disturbance.  Respiratory: Negative for cough and shortness of breath.   Cardiovascular: Positive for chest pain. Negative for palpitations and leg swelling.  Gastrointestinal: Positive for abdominal pain. Negative for abdominal distention, anal bleeding, blood in stool, constipation, diarrhea, nausea and vomiting.  Endocrine: Negative for polydipsia, polyphagia and polyuria.  Musculoskeletal: Positive for back pain. Negative for arthralgias, gait problem, myalgias and neck pain.  Skin: Negative for rash.  Allergic/Immunologic: Negative for immunocompromised state.  Hematological: Negative for adenopathy. Does not bruise/bleed easily.  Psychiatric/Behavioral: Positive for sleep disturbance. Negative for dysphoric mood and suicidal ideas. The patient is not nervous/anxious.     Objective:  BP 120/78 (BP Location: Right Arm, Patient Position: Sitting, Cuff Size: Small)   Pulse 69   Temp 97.9 F (36.6 C) (Oral)   Ht 5\' 9"  (1.753 m)   Wt 160 lb 12.8 oz (72.9 kg)   SpO2 100%   BMI 23.75 kg/m   BP/Weight 02/18/2016 Q000111Q Q000111Q  Systolic BP 123456 123XX123 123456  Diastolic BP 78 76 50  Wt. (Lbs) 160.8 167 176.5  BMI 23.75 24.65 26.05   Physical Exam  Constitutional: He appears well-developed and well-nourished. No distress.  HENT:  Head: Normocephalic and atraumatic.  Neck: Normal range of motion. Neck  supple.  Cardiovascular: Normal rate, regular rhythm, normal heart sounds and intact distal pulses.   Pulmonary/Chest: Effort normal and breath sounds normal.  Musculoskeletal: He exhibits no edema.  Neurological: He is alert.  Skin: Skin is warm and dry. No rash noted. No erythema.  Psychiatric: He has a normal mood and affect.   EKG: PAC's noted, left axis deviation. Normal sinus rhythm.    Assessment & Plan:  Jeremy Booker was seen today for hospitalization follow-up.  Diagnoses and all orders for this visit:  Other acute pulmonary embolism without acute cor pulmonale (HCC) -     Discontinue: rivaroxaban (XARELTO) 20 MG TABS tablet; Take 1 tablet (20 mg total) by mouth daily with supper. -     rivaroxaban (XARELTO) 20 MG TABS tablet; Take 1 tablet (20 mg total) by mouth daily with supper. -     albuterol (PROVENTIL) (2.5 MG/3ML) 0.083% nebulizer solution 2.5 mg; Take 3 mLs (2.5 mg total) by nebulization once.  History of cocaine abuse -     Pain Mgmt, Profile 1 w/o Conf, U  Chronic chest pain -  Discontinue: acetaminophen-codeine (TYLENOL #4) 300-60 MG tablet; Take 1 tablet by mouth 2 (two) times daily as needed for moderate pain. -     acetaminophen-codeine (TYLENOL #4) 300-60 MG tablet; Take 1 tablet by mouth 2 (two) times daily as needed for moderate pain.  Lower abdominal pain -     Discontinue: acetaminophen-codeine (TYLENOL #4) 300-60 MG tablet; Take 1 tablet by mouth 2 (two) times daily as needed for moderate pain. -     acetaminophen-codeine (TYLENOL #4) 300-60 MG tablet; Take 1 tablet by mouth 2 (two) times daily as needed for moderate pain.  Paroxysmal atrial fibrillation ([place]) -     Ambulatory referral to Cardiology  Insomnia, unspecified type -     zolpidem (AMBIEN) 5 MG tablet; Take 1 tablet (5 mg total) by mouth at bedtime as needed for sleep.   There are no diagnoses linked to this encounter.  Meds ordered this encounter  Medications  . DISCONTD:  acetaminophen-codeine (TYLENOL #4) 300-60 MG tablet    Sig: Take 1 tablet by mouth 2 (two) times daily as needed for moderate pain.    Dispense:  60 tablet    Refill:  0  . DISCONTD: rivaroxaban (XARELTO) 20 MG TABS tablet    Sig: Take 1 tablet (20 mg total) by mouth daily with supper.    Dispense:  90 tablet    Refill:  3  . rivaroxaban (XARELTO) 20 MG TABS tablet    Sig: Take 1 tablet (20 mg total) by mouth daily with supper.    Dispense:  90 tablet    Refill:  3  . acetaminophen-codeine (TYLENOL #4) 300-60 MG tablet    Sig: Take 1 tablet by mouth 2 (two) times daily as needed for moderate pain.    Dispense:  60 tablet    Refill:  0  . zolpidem (AMBIEN) 5 MG tablet    Sig: Take 1 tablet (5 mg total) by mouth at bedtime as needed for sleep.    Dispense:  30 tablet    Refill:  0    Follow-up: No Follow-up on file.   Boykin Nearing MD

## 2016-02-19 LAB — PAIN MGMT, PROFILE 1 W/O CONF, U
Amphetamines: NEGATIVE ng/mL (ref <500)
BENZODIAZEPINES: NEGATIVE ng/mL (ref <100)
Barbiturates: NEGATIVE ng/mL (ref <300)
Cocaine Metabolite: NEGATIVE ng/mL (ref <150)
Creatinine: 224.4 mg/dL (ref >=20.0)
Marijuana Metabolite: NEGATIVE ng/mL (ref <20)
Methadone Metabolite: NEGATIVE ng/mL (ref <100)
OXIDANT: NEGATIVE ug/mL (ref <200)
OXYCODONE: POSITIVE ng/mL — AB (ref <100)
Opiates: POSITIVE ng/mL — AB (ref <100)
PH: 7.42 (ref 4.5–9.0)
Phencyclidine: NEGATIVE ng/mL (ref <25)

## 2016-02-20 ENCOUNTER — Encounter: Payer: Self-pay | Admitting: Family Medicine

## 2016-02-20 DIAGNOSIS — Z9049 Acquired absence of other specified parts of digestive tract: Secondary | ICD-10-CM | POA: Insufficient documentation

## 2016-02-20 NOTE — Assessment & Plan Note (Signed)
Recurrent acute PE in setting of xarelto non compliance No hypoxemia  Plan Continue xarelto

## 2016-02-20 NOTE — Assessment & Plan Note (Signed)
Paroxysmal A fib in setting of cocaine abuse and alcohol abuse  Patient in sinus rhythm Anticoagulated with xarelto Has stopped cocaine and alcohol

## 2016-02-20 NOTE — Assessment & Plan Note (Signed)
Chronic chest pain without evidence of ischemia or extensive w/u or on recent EKG Plan: Control BP Pain control with tylenol #4 and tramadol

## 2016-02-20 NOTE — Assessment & Plan Note (Signed)
bicuspid aortic valve, cardiac imaging reveals evidence of prior aortic interruption and repair

## 2016-02-20 NOTE — Assessment & Plan Note (Signed)
S/p R hemicolectomy for ischemic colitis in setting of cocaine abuse and A fib Recovering Patient with abdominal pain Advised soft diet Keep f/u with GI

## 2016-02-22 MED FILL — **XARELTO 20 MG TABLET: 20 MG | 5 days supply | Qty: 5 | Fill #0

## 2016-02-22 MED FILL — IPRAT-ALBUT 0.5-3(2.5) MG/3: 0.5-2.5 (3) | 45 days supply | Qty: 180 | Fill #0

## 2016-02-28 ENCOUNTER — Other Ambulatory Visit: Payer: Self-pay | Admitting: Family Medicine

## 2016-02-28 MED FILL — **XARELTO 20 MG TABLET: 20 MG | 5 days supply | Qty: 5 | Fill #1

## 2016-02-29 ENCOUNTER — Telehealth: Payer: Self-pay | Admitting: Family Medicine

## 2016-02-29 NOTE — Telephone Encounter (Signed)
Patient is needing a refill for tramadol. Patient said that he has lost his bottle of medication.  Please follow up.

## 2016-03-03 MED ORDER — TRAMADOL HCL 50 MG PO TABS: 50.0000 mg | ORAL_TABLET | Freq: Three times a day (TID) | ORAL | 0 refills | Status: DC | PRN

## 2016-03-03 NOTE — Telephone Encounter (Signed)
Tramadol ready for pick up  

## 2016-03-03 NOTE — Telephone Encounter (Signed)
Pt was called on 10/23 and informed of script being ready for pick up. 

## 2016-03-05 MED FILL — XARELTO 20 MG TABLET: 20 | 30 days supply | Qty: 30 | Fill #2

## 2016-03-07 ENCOUNTER — Telehealth: Payer: Self-pay | Admitting: Family Medicine

## 2016-03-07 NOTE — Telephone Encounter (Signed)
Patient called stated would like Dr. Adrian Blackwater to specify that he is 100% disabled and that his illness is expected to last more than a year. Please f/u with patient.

## 2016-03-10 ENCOUNTER — Other Ambulatory Visit: Payer: Self-pay | Admitting: Family Medicine

## 2016-03-10 ENCOUNTER — Ambulatory Visit: Payer: Self-pay | Admitting: Cardiology

## 2016-03-10 DIAGNOSIS — M545 Low back pain, unspecified: Secondary | ICD-10-CM

## 2016-03-10 DIAGNOSIS — J438 Other emphysema: Secondary | ICD-10-CM

## 2016-03-10 DIAGNOSIS — G8929 Other chronic pain: Secondary | ICD-10-CM

## 2016-03-10 MED FILL — GABAPENTIN 300 MG CAPSULE: 300 | 30 days supply | Qty: 90 | Fill #0

## 2016-03-10 MED FILL — **XARELTO 20 MG TABLET: 20 MG | 7 days supply | Qty: 7 | Fill #3

## 2016-03-10 MED FILL — VENTOLIN HFA 90 MCG INHALER: 108 (90 BAS | 30 days supply | Qty: 18 | Fill #0

## 2016-03-10 NOTE — Telephone Encounter (Signed)
Please inform patient letter written and ready for pick up  

## 2016-03-10 NOTE — Telephone Encounter (Signed)
Pt ws called on 10/30 and informed of paperwork being ready for pick up.

## 2016-03-12 ENCOUNTER — Other Ambulatory Visit: Payer: Self-pay | Admitting: *Deleted

## 2016-03-12 DIAGNOSIS — I2699 Other pulmonary embolism without acute cor pulmonale: Secondary | ICD-10-CM

## 2016-03-12 MED ORDER — RIVAROXABAN 20 MG PO TABS: 20.0000 mg | ORAL_TABLET | Freq: Every day | ORAL | 3 refills | Status: DC

## 2016-03-12 NOTE — Telephone Encounter (Signed)
PRINTED FOR PASS PROGRAM 

## 2016-03-13 ENCOUNTER — Other Ambulatory Visit: Payer: Self-pay

## 2016-03-13 DIAGNOSIS — I2699 Other pulmonary embolism without acute cor pulmonale: Secondary | ICD-10-CM

## 2016-03-13 MED ORDER — RIVAROXABAN 20 MG PO TABS: 20.0000 mg | ORAL_TABLET | Freq: Every day | ORAL | 3 refills | Status: DC

## 2016-03-19 MED FILL — XARELTO 20 MG TABLET: 20 | 30 days supply | Qty: 30 | Fill #4

## 2016-03-20 ENCOUNTER — Ambulatory Visit (INDEPENDENT_AMBULATORY_CARE_PROVIDER_SITE_OTHER): Payer: Self-pay | Admitting: Physician Assistant

## 2016-03-20 ENCOUNTER — Encounter: Payer: Self-pay | Admitting: *Deleted

## 2016-03-20 ENCOUNTER — Encounter: Payer: Self-pay | Admitting: Physician Assistant

## 2016-03-20 DIAGNOSIS — I480 Paroxysmal atrial fibrillation: Secondary | ICD-10-CM

## 2016-03-20 DIAGNOSIS — R0781 Pleurodynia: Secondary | ICD-10-CM

## 2016-03-20 DIAGNOSIS — Q249 Congenital malformation of heart, unspecified: Secondary | ICD-10-CM

## 2016-03-20 DIAGNOSIS — I2699 Other pulmonary embolism without acute cor pulmonale: Secondary | ICD-10-CM

## 2016-03-20 DIAGNOSIS — F1410 Cocaine abuse, uncomplicated: Secondary | ICD-10-CM

## 2016-03-20 MED ORDER — COLCHICINE 0.6 MG PO TABS: ORAL_TABLET | ORAL | 1 refills | Status: DC

## 2016-03-20 MED FILL — COLCRYS 0.6 MG TABLET: 0.6 | 30 days supply | Qty: 60 | Fill #0

## 2016-03-20 NOTE — Patient Instructions (Addendum)
Medication Instructions:  Your physician has recommended you make the following change in your medication:  1.  START the Colchicine 0.6 mg taking 1 tablet by mouth twice a day X's 1 month then take 1 tablet daily   Labwork: None ordered  Testing/Procedures: Your physician has recommended that you wear a holter monitor. Holter monitors are medical devices that record the heart's electrical activity. Doctors most often use these monitors to diagnose arrhythmias. Arrhythmias are problems with the speed or rhythm of the heartbeat. The monitor is a small, portable device. You can wear one while you do your normal daily activities.   Follow-Up: Your physician recommends that you schedule a follow-up appointment in: [place] (IF PT CANNOT GET IN WITH CARDIOLOGIST AT [facility] BEFORE THEN)   Any Other Special Instructions Will Be Listed Below (If Applicable). You have been referred to [facility] for Adult Congenital Heart Disease.  They will contact you with an appointment.  If you have not heard anything within a week, please call our office.     If you need a refill on your cardiac medications before your next appointment, please call your pharmacy.

## 2016-03-20 NOTE — Progress Notes (Addendum)
Cardiology Office Note    Date:  03/20/2016  ID:  Jeremy Booker, DOB [birth year], MRN [id number] PCP:  Minerva Ends, MD  Cardiologist:  Previously followed by [facility]. Saw Dr. Maryclare Labrador in consult in 2016. Jeremy need to be referred to Adult Congenital Heart MD.   Chief Complaint: atrial fib  History of Present Illness:  Jeremy Booker is a 50 y.o. 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   The patient has a history of on/off chest pain his whole life. Per notes in 2010 he had a cath in [place], [place] that was reportedly normal. 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 He says he was told in the past by the doctor following his congenital heart disease that his heart is fragile and further heart procedures should be avoided. He was seen by our group in the hospital 06/2014 for chest pain at which time he was found to have a R lung PE. In 01/2015 he underwent stress test at [facility] which was repotedly low risk with EF 59% and suspected RV hypertrophy. He followed up in our office 01/2015 at which time he continued to have some chest pain and was advised to f/u PCP for further eval. 2D Echo 03/2015 showed technically difficult, EF 55-60%, no RWMA, moderate LVH, bicuspid aortic valve, mildly dilated aortic root 34mm, mild diffuse thickening and calcification of the mitral valve anterior leaflet, mild PR, RV normal. CTAs 12/2014 and 05/2015 showed no acute PE but did  show abnormal changes of the pulmonary outflow tract and aorta which appears to have prompted cardiac MRI at [facility] 05/2015 showing EF 62%, mildly dilated RV EF 47%, bicuspid aortic alve with fusion of R/L coronary cusp without obstruction or regurg, dilated distal RVOT, normal PV with no pulmonary regurg, dilation of main PA followed by narrowing of PA, left branch PA stenosis <50%, bicuspid aortic valve is present with segmental narrowing and post stenotic dilation of the aortic arch without hemodynamic significantobstruction could be consistent with prior aortic arch repair.[Probably had prior aortic interruption and had repair].  In 01/2016 he was admitted to [facility] with SOB, abdominal pain, new AF and CT abdomen showing a 5cm cecal mass. He underwent colonoscopy 01/29/3016 with polypectomy. 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 A cecal biopsy was done that reflected ischemia without malignancy or adenoma. On 01/31/2016 he underwent R sided hemicolectomy the pathology confirmed ischemia without malignancy or adenoma. During that admission he had EKG on 01/27/16 showing afib RVR in setting cocaine abuse, converted to NSR on diltiazem. Beta blocker was avoided due to active cocaine abuse. He was found to have multiple RLL PEs on CTA 01/27/16. He was reloaded with Xarelto (had been noncompliant for 2 weeks prior to his hospitalization). CTA showed mild-mod cardiomegaly with reflux of contrast into hepatic veins suggesting right heart dysfunction, mild-mod emphysematous changes with nonspecific nodular densities RUL. 2D Echo 01/2016: mod LVH, EF >55%, mild MR, mild RVH, mild aortic root dilation, mild  RAE. Labs 01/2016 showed Hgb 12.6, plt 307, Na 135, K 4.6, Cr 0.93, AST 57, ALT 50.  He presents to clinic today for evaluation of chest pain and follow-up of atrial fib. He says he has chest pain on/off all day every day. This is sometimes described as a brief  sharp poke but sometimes lasts several minutes while sitting doing nothing at all. His discomfort is definitely worse with inspiration. It is also brought on by emotional stress or getting angry. This is similar to what he had in 06/2014. Reports compliance with Xarelto without any missed doses. No difference in discomfort with exertion or position changes. He also has intermittent SOB that occurs at random times. No palpitations, syncope, LEE, orthopnea, dizziness. Not tachycardic, tachypneic or hypoxic. Chest pain is better when he massages his heart area or mentally calms down. He inquires about whether we would refill his pain medication. Denies further cocaine use or alcohol usage.   Past Medical History:  Diagnosis Date  . Anxiety   . Atrial fibrillation ([place])    a. Dx 01/2016 in setting of PE, cocaine.  . Bicuspid aortic valve   . CAD (coronary artery disease)   . Chest pain    a. 2010 s/p cath in [place], [place] - reportedly nl.  . Chronic chest pain   . Cocaine abuse   . Congenital heart disease    a. thinks he had "a hole in my heart" s/p surgical correction @ age 24 and then age 66. Per notes, suspected prior aortic interruption and had repair.  Marland Kitchen COPD (chronic obstructive pulmonary disease) ([place])   . Depression   . Drug abuse    a. 06/2014 Cocaine/Marijuana - last used a few months ago.  Marland Kitchen ETOH abuse    a. 06/2014 currently a few bottles of wine/week.  . Hypertension       . Pulmonary embolism ([place])   . RBBB   . Recurrent pulmonary emboli ([place])    a. R lung 06/2014. b. multiple RLL PEs 01/2016.  . Tobacco abuse    a. 30 pack years. Quit   . Tubular adenoma    a. s/p right hemicolectomy 01/2016, neg path for malignancy, positive for ischemia.    Past Surgical History:  Procedure Laterality Date  . APPENDECTOMY    . CARDIAC SURGERY     congenital   . TONSILLECTOMY      Current Medications: Current Outpatient Prescriptions  Medication Sig Dispense Refill  .  acetaminophen-codeine (TYLENOL #4) 300-60 MG tablet Take 1 tablet by mouth 2 (two) times daily as needed for moderate pain. 60 tablet 0  . albuterol (PROVENTIL) (2.5 MG/3ML) 0.083% nebulizer solution Take 3 mLs (2.5 mg total) by nebulization every 6 (six) hours as needed for wheezing or shortness of breath. 150 mL 1  . amLODipine (NORVASC) 10 MG tablet TAKE 1 TABLET BY MOUTH DAILY. 30 tablet 3  . budesonide-formoterol (SYMBICORT) 80-4.5 MCG/ACT inhaler Inhale 2 puffs into the lungs 2 (two) times daily. 1 Inhaler 3  . gabapentin (NEURONTIN) 300 MG capsule TAKE ONE CAPSULE BY MOUTH THREE TIMES A DAY 90 capsule 3  . methocarbamol (ROBAXIN) 500 MG tablet Take 1 tablet (500 mg total) by mouth every 8 (eight) hours as needed for muscle spasms. 60 tablet 3  . nitroGLYCERIN (NITROSTAT) 0.4 MG SL tablet Place 1 tablet (0.4 mg total) under the tongue every 5 (five) minutes as needed for chest pain. Daily max- 3 doses 50 tablet 1  . oxyCODONE-acetaminophen (PERCOCET/ROXICET)  5-325 MG tablet Take by mouth.    . pantoprazole (PROTONIX) 40 MG tablet Take 1 tablet (40 mg total) by mouth 2 (two) times daily. 60 tablet 6  . rivaroxaban (XARELTO) 20 MG TABS tablet Take 1 tablet (20 mg total) by mouth daily with supper. 90 tablet 3  . traMADol (ULTRAM) 50 MG tablet Take 1 tablet (50 mg total) by mouth every 8 (eight) hours as needed. 60 tablet 0  . VENTOLIN HFA 108 (90 Base) MCG/ACT inhaler INHALE 2 PUFFS INTO THE LUNGS EVERY 6 HOURS AS NEEDED FOR WHEEZING OR SHORTNESS OF BREATH. 18 g 1  . zolpidem (AMBIEN) 5 MG tablet Take 1 tablet (5 mg total) by mouth at bedtime as needed for sleep. 30 tablet 0   Current Facility-Administered Medications  Medication Dose Route Frequency Provider Last Rate Last Dose  . albuterol (PROVENTIL) (2.5 MG/3ML) 0.083% nebulizer solution 2.5 mg  2.5 mg Nebulization Once Josalyn Funches, MD         Allergies:   Buprenorphine hcl; Morphine and related; Baclofen; Dilaudid [hydromorphone hcl];  Morphine; and Naprosyn [naproxen]   Social History   Social History  . Marital status: Single    Spouse name: N/A  . Number of children: 3  . Years of education: N/A   Occupational History  . none    Social History Main Topics  . Smoking status: Former Smoker    Packs/day: 0.00    Years: 30.00    Types: Cigarettes    Quit date: 09/29/2015  . Smokeless tobacco: Never Used  . Alcohol use No     Comment: Previously drank heavily on a daily basis.  Now drinks a few bottles of wine 1-2 x /wk (06/2014)  . Drug use:     Types: Cocaine, Marijuana     Comment: last used Jan  2016  . Sexual activity: Not Asked   Other Topics Concern  . None   Social History Narrative   Was living in [place].  Now living with girlfriend in [place].  Unemployed and possibly pending disability.     Family History:  The patient's family history includes Cancer in his brother and maternal uncle; Clotting disorder in his mother; Heart disease in his maternal uncle and sister; Hypertension in his maternal uncle and mother; Other in his father.   ROS:   Please see the history of present illness. No LEE. All other systems are reviewed and otherwise negative.    PHYSICAL EXAM:   VS:  BP 100/70 (BP Location: Right Arm, Patient Position: Sitting, Cuff Size: Normal)   Pulse (!) 56   Ht 5\' 9"  (1.753 m)   Wt 167 lb 4 oz (75.9 kg)   BMI 24.70 kg/m   BMI: Body mass index is 24.7 kg/m. Pulse ox 99% GEN: Well nourished, well developed AAM, in no acute distress  HEENT: normocephalic, atraumatic Neck: no JVD, carotid bruits, or masses Cardiac: RRR;spit S2?, no edema, no murmurs or rubs Respiratory:  clear to auscultation bilaterally, normal work of breathing GI: soft, nontender, nondistended, + BS MS: no deformity or atrophy  Skin: warm and dry, no rash Neuro:  Alert and Oriented x 3, Strength and sensation are intact, follows commands Psych: euthymic mood, full affect  Wt Readings from Last 3 Encounters:    03/20/16 167 lb 4 oz (75.9 kg)  02/18/16 160 lb 12.8 oz (72.9 kg)  11/29/15 167 lb (75.8 kg)      Studies/Labs Reviewed:   EKG:  EKG was  ordered today and personally reviewed by me and demonstrates sinus bradycardia 57bpm, possible LAE, left axis deviation, RBBB, prior anterior infarct, compatible with prior tracing.  Recent Labs: No results found for requested labs within last 8760 hours.   Lipid Panel    Component Value Date/Time   CHOL 192 07/03/2014 0130   TRIG 149 07/03/2014 0130   HDL 51 07/03/2014 0130   CHOLHDL 3.8 07/03/2014 0130   VLDL 30 07/03/2014 0130   LDLCALC 111 (H) 07/03/2014 0130    Additional studies/ records that were reviewed today include: Summarized above    ASSESSMENT & PLAN:   1. Paroxysmal atrial fib - maintaining NSR. He is already on Xarelto for his history of recurrent PE. Avoid cocaine. Addendum: the patient had a brief episode of pain while he was about to leave the office and reported his heart was racing briefly. Palpation of pulse was felt to be a normal, regular rate. In retrospect he now states he feels his heart rate escalate whenever he has this pain. He is very anxious about making sure his heart rhythm as stable as he says he was previously told he may need to wear a heart monitor for several months, unclear where this recommendation came from. Jeremy obtain 24-hour holter. Would await results before empirically adding AVN blocker given his borderline BP. 2. Chest pain - atypical in many ways, but there is a definite pleuritic component. No striking features to suggest CAD. Not positional, thus pericarditis less likely. Patient seen/examined with Dr. Curt Bears given his extremely complicated history. Question whether he has pleurisy related to his recent PEs. Jeremy try a trial of colchicine 0.6mg  BID for 1 month then reduce to 1 tablet daily. I advised that we do not refill narcotic medication in this office and he should f/u with the provider that  is managing this for him. 3. Congenital heart disease - we do not have a physician in this office that manages adult congenital heart disease. Per review with our colleague Dr. Radford Pax, the only person who used to do adult congenital disease at [facility] has left. Jeremy refer to [facility] or [facility] to manage. 4. Cocaine abuse - stressed importance of complete abstinence with patient. 5. Recurrent PE - on Xarelto per primary care. Not tachycardic, tachypneic or hypoxic.  Disposition: F/u with [facility] or [facility] Adult Congenital as above, Jeremy also schedule interim f/u in our office in 6 weeks for recheck since it may take a while to get appointment with tertiary center.   Medication Adjustments/Labs and Tests Ordered: Current medicines are reviewed at length with the patient today.  Concerns regarding medicines are outlined above. Medication changes, Labs and Tests ordered today are summarized above and listed in the Patient Instructions accessible in Encounters.   Raechel Ache PA-C  03/20/2016 3:18 PM    [facility] [address], [place], [place]  [address] Phone: [phone]; Fax: [phone]   I have seen and examined this patient with Melina Copa.  Agree with above, note added to reflect my findings.  On exam, regular rhythm, no murmurs, lungs clear. Presented with chest pain.  Worse with deep inspiration.  Jeremy try colchicine.  Has some congenital heart disease.  Jeremy plan for follow up with [facility] or [facility] congenital.    Jeremy M. Camnitz MD 03/20/2016 4:34 PM

## 2016-03-25 ENCOUNTER — Other Ambulatory Visit: Payer: Self-pay | Admitting: Family Medicine

## 2016-03-27 NOTE — Telephone Encounter (Signed)
Pt was called and a VM was left informing pt of script being ready for pick up. 

## 2016-03-27 NOTE — Telephone Encounter (Signed)
Please inform patient tramadol refill ready for pick up

## 2016-04-01 ENCOUNTER — Encounter: Payer: Self-pay | Admitting: *Deleted

## 2016-04-01 NOTE — Progress Notes (Signed)
Please call patient to reschedule or send letter if unable to reach him.

## 2016-04-01 NOTE — Progress Notes (Signed)
Patient ID: Jeremy Booker, male   DOB: 1965-10-06, 50 y.o.   MRN: [id number] Patient did not show up for 04/01/16, 10:30 AM, appointment to have a 24 hour holter monitor applied.

## 2016-04-23 MED FILL — GABAPENTIN 300 MG CAPSULE: 300 | 30 days supply | Qty: 90 | Fill #1

## 2016-04-23 MED FILL — PANTOPRAZOLE SOD DR 40 MG T: 40 | 30 days supply | Qty: 60 | Fill #3

## 2016-04-23 MED FILL — VENTOLIN HFA 90 MCG INHALER: 108 (90 BAS | 30 days supply | Qty: 18 | Fill #1

## 2016-04-23 MED FILL — AMLODIPINE BESYLATE 10 MG T: 10 | 30 days supply | Qty: 30 | Fill #1

## 2016-04-23 MED FILL — COLCHICINE 0.6 MG TABLET: 0.6 | 30 days supply | Qty: 60 | Fill #1

## 2016-04-23 MED FILL — METHOCARBAMOL 500 MG TABLET: 500 | 20 days supply | Qty: 60 | Fill #3

## 2016-04-23 MED FILL — XARELTO 20 MG TABLET: 20 | 30 days supply | Qty: 30 | Fill #5

## 2016-04-25 ENCOUNTER — Ambulatory Visit: Payer: Self-pay | Admitting: Family Medicine

## 2016-04-29 ENCOUNTER — Ambulatory Visit (INDEPENDENT_AMBULATORY_CARE_PROVIDER_SITE_OTHER): Payer: Self-pay

## 2016-04-29 DIAGNOSIS — I480 Paroxysmal atrial fibrillation: Secondary | ICD-10-CM

## 2016-05-02 ENCOUNTER — Telehealth: Payer: Self-pay | Admitting: Physician Assistant

## 2016-05-02 NOTE — Telephone Encounter (Signed)
New message  Pt called to say he will turn in his heart monitor on Tuesday  Pt was unable to bring it today

## 2016-05-08 ENCOUNTER — Ambulatory Visit: Payer: Self-pay | Admitting: Family Medicine

## 2016-05-22 ENCOUNTER — Ambulatory Visit: Payer: Self-pay | Attending: Family Medicine | Admitting: Family Medicine

## 2016-05-22 ENCOUNTER — Other Ambulatory Visit: Payer: Self-pay | Admitting: Physician Assistant

## 2016-05-22 ENCOUNTER — Encounter: Payer: Self-pay | Admitting: Family Medicine

## 2016-05-22 ENCOUNTER — Other Ambulatory Visit: Payer: Self-pay | Admitting: Family Medicine

## 2016-05-22 VITALS — BP 129/84 | HR 72 | Temp 97.9°F | Ht 69.0 in | Wt 172.8 lb

## 2016-05-22 DIAGNOSIS — R0789 Other chest pain: Secondary | ICD-10-CM

## 2016-05-22 DIAGNOSIS — Z5189 Encounter for other specified aftercare: Secondary | ICD-10-CM | POA: Insufficient documentation

## 2016-05-22 DIAGNOSIS — Z87891 Personal history of nicotine dependence: Secondary | ICD-10-CM | POA: Insufficient documentation

## 2016-05-22 DIAGNOSIS — G4700 Insomnia, unspecified: Secondary | ICD-10-CM

## 2016-05-22 DIAGNOSIS — M545 Low back pain: Secondary | ICD-10-CM

## 2016-05-22 DIAGNOSIS — Q249 Congenital malformation of heart, unspecified: Secondary | ICD-10-CM

## 2016-05-22 DIAGNOSIS — F1410 Cocaine abuse, uncomplicated: Secondary | ICD-10-CM

## 2016-05-22 DIAGNOSIS — Z79899 Other long term (current) drug therapy: Secondary | ICD-10-CM | POA: Insufficient documentation

## 2016-05-22 DIAGNOSIS — K922 Gastrointestinal hemorrhage, unspecified: Secondary | ICD-10-CM

## 2016-05-22 DIAGNOSIS — J438 Other emphysema: Secondary | ICD-10-CM

## 2016-05-22 DIAGNOSIS — G8929 Other chronic pain: Secondary | ICD-10-CM

## 2016-05-22 DIAGNOSIS — Z7901 Long term (current) use of anticoagulants: Secondary | ICD-10-CM | POA: Insufficient documentation

## 2016-05-22 DIAGNOSIS — G4730 Sleep apnea, unspecified: Secondary | ICD-10-CM

## 2016-05-22 DIAGNOSIS — K921 Melena: Secondary | ICD-10-CM | POA: Insufficient documentation

## 2016-05-22 DIAGNOSIS — F1910 Other psychoactive substance abuse, uncomplicated: Secondary | ICD-10-CM

## 2016-05-22 LAB — HEMOCCULT GUIAC POC 1CARD (OFFICE): Fecal Occult Blood, POC: NEGATIVE

## 2016-05-22 LAB — CBC
HEMATOCRIT: 40.8 % (ref 38.5–50.0)
HEMOGLOBIN: 13.6 g/dL (ref 13.2–17.1)
MCH: 30.3 pg (ref 27.0–33.0)
MCHC: 33.3 g/dL (ref 32.0–36.0)
MCV: 90.9 fL (ref 80.0–100.0)
MPV: 9.2 fL (ref 7.5–12.5)
Platelets: 344 10*3/uL (ref 140–400)
RBC: 4.49 MIL/uL (ref 4.20–5.80)
RDW: 14.2 % (ref 11.0–15.0)
WBC: 6 10*3/uL (ref 3.8–10.8)

## 2016-05-22 MED ORDER — ACETAMINOPHEN-CODEINE #3 300-30 MG PO TABS: 1.0000 | ORAL_TABLET | Freq: Two times a day (BID) | ORAL | 2 refills | Status: DC

## 2016-05-22 MED ORDER — ZOLPIDEM TARTRATE 5 MG PO TABS: 5.0000 mg | ORAL_TABLET | Freq: Every evening | ORAL | 2 refills | Status: DC | PRN

## 2016-05-22 MED FILL — GABAPENTIN 300 MG CAPSULE: 300 | 30 days supply | Qty: 90 | Fill #2

## 2016-05-22 MED FILL — XARELTO 20 MG TABLET: 20 | 30 days supply | Qty: 30 | Fill #6

## 2016-05-22 MED FILL — PANTOPRAZOLE SOD DR 40 MG T: 40 | 30 days supply | Qty: 60 | Fill #4

## 2016-05-22 MED FILL — AMLODIPINE BESYLATE 10 MG T: 10 | 30 days supply | Qty: 30 | Fill #2

## 2016-05-22 NOTE — Assessment & Plan Note (Signed)
Patient referred to Clinical social worker for transportation resources

## 2016-05-22 NOTE — Assessment & Plan Note (Signed)
Will treat pain with tramadol and tylenol #3 as long as UDS negative

## 2016-05-22 NOTE — Progress Notes (Signed)
Pt is here today for breathing issues. Pt is also having chest pains, back pains,and abdominal pains.
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
## 2016-05-22 NOTE — Patient Instructions (Addendum)
Jeremy Booker was seen today for follow-up.  Diagnoses and all orders for this visit:  Drug abuse -     Drug Abuse Panel 10-50 No Conf, U  Sleep apnea, unspecified type -     Ambulatory referral to Pulmonology  Insomnia, unspecified type -     zolpidem (AMBIEN) 5 MG tablet; Take 1 tablet (5 mg total) by mouth at bedtime as needed for sleep.  Lower GI bleed -     CBC -     Hemoccult - 1 Card (office)  Other orders -     Cancel: Ambulatory referral to Gastroenterology  you will be called with urine drug screen results if negative tylenol #3 and tramadol will be filled.  Please see social worker for transportation assistance to [facility] congenital heart disease clinic   F/u in 3 months for chest pains   Dr. Adrian Blackwater

## 2016-05-22 NOTE — Assessment & Plan Note (Signed)
UDS done today.  

## 2016-05-22 NOTE — Assessment & Plan Note (Signed)
Report of GI bleed Heme negative stools today CBC

## 2016-05-23 ENCOUNTER — Telehealth: Payer: Self-pay | Admitting: Family Medicine

## 2016-05-23 NOTE — Telephone Encounter (Signed)
Please advise on refill request. Thanks, MI 

## 2016-05-23 NOTE — Telephone Encounter (Signed)
Patient called requesting that results from tests on 05/22/16 be called in to him at phone number [phone], not to his wife's number.

## 2016-05-24 LAB — DRUG ABUSE PANEL 10-50 NO CONF, U
AMPHETAMINES (1000 NG/ML SCRN): NEGATIVE
BARBITURATES: NEGATIVE
BENZODIAZEPINES: NEGATIVE
COCAINE METABOLITES: POSITIVE — AB
MARIJUANA MET (50 ng/mL SCRN): NEGATIVE
METHADONE: NEGATIVE
METHAQUALONE: NEGATIVE
OPIATES: NEGATIVE
PHENCYCLIDINE: NEGATIVE
PROPOXYPHENE: NEGATIVE

## 2016-05-24 NOTE — Telephone Encounter (Signed)
Needs follow-up visit before refilling.

## 2016-05-26 DIAGNOSIS — F1410 Cocaine abuse, uncomplicated: Secondary | ICD-10-CM | POA: Insufficient documentation

## 2016-05-26 NOTE — Assessment & Plan Note (Signed)
UDS still positive for cocaine Please call patient, see phone note, he requested call to his cell phone  He is reminded that cocaine is a common cause of chest pain due effects of cocaine on heart muscle and coronary arteries Patient advised to seek substance abuse treatment at [facility] or [facility] I will not be able to refill pain medication until UDS is negative

## 2016-06-02 NOTE — Telephone Encounter (Signed)
Pt. Called requesting his lab results. Please f/u with pt.

## 2016-06-04 ENCOUNTER — Telehealth: Payer: Self-pay

## 2016-06-04 NOTE — Telephone Encounter (Signed)
Pt returned phone call for lab results. Pt is aware of lab results and drug screen results.

## 2016-06-04 NOTE — Telephone Encounter (Signed)
Pt was called and a VM was left informing pt to return phone call for lab results. 

## 2016-06-20 ENCOUNTER — Ambulatory Visit: Payer: Self-pay | Admitting: Family Medicine

## 2016-06-25 MED FILL — AMLODIPINE BESYLATE 10 MG T: 10 | 30 days supply | Qty: 30 | Fill #3

## 2016-06-25 MED FILL — XARELTO 20 MG TABLET: 20 | 30 days supply | Qty: 30 | Fill #7

## 2016-06-25 MED FILL — GABAPENTIN 300 MG CAPSULE: 300 | 30 days supply | Qty: 90 | Fill #3

## 2016-06-25 MED FILL — ?PANTOPRAZOLE SOD DR 40MG: 40 MG | 30 days supply | Qty: 60 | Fill #5

## 2016-06-25 MED FILL — IPRAT-ALBUT 0.5-3(2.5) MG/3: 0.5-2.5 (3) | 45 days supply | Qty: 180 | Fill #0

## 2016-07-08 ENCOUNTER — Ambulatory Visit: Payer: Self-pay | Admitting: Family Medicine

## 2016-07-10 ENCOUNTER — Institutional Professional Consult (permissible substitution): Payer: Self-pay | Admitting: Pulmonary Disease

## 2016-07-22 ENCOUNTER — Ambulatory Visit: Payer: Self-pay | Admitting: Family Medicine

## 2016-07-23 ENCOUNTER — Other Ambulatory Visit: Payer: Self-pay | Admitting: Family Medicine

## 2016-07-23 DIAGNOSIS — I10 Essential (primary) hypertension: Secondary | ICD-10-CM

## 2016-08-14 ENCOUNTER — Institutional Professional Consult (permissible substitution): Payer: Self-pay | Admitting: Pulmonary Disease

## 2016-09-24 ENCOUNTER — Encounter: Payer: Self-pay | Admitting: Family Medicine

## 2016-10-02 ENCOUNTER — Institutional Professional Consult (permissible substitution): Payer: Self-pay | Admitting: Pulmonary Disease

## 2016-10-06 ENCOUNTER — Encounter: Payer: Self-pay | Admitting: Family Medicine

## 2016-10-06 NOTE — Progress Notes (Signed)
Reviewed OV note from GI 10/03/16 Planning EGD and c-scope for GI bleed  He is on eliquis 5 mg BID for A fib and recent small foci of PE (dorsal basilar lower lobe pulmonary arteries, bilateral)

## 2016-10-07 ENCOUNTER — Encounter: Payer: Self-pay | Admitting: Family Medicine

## 2016-10-08 DIAGNOSIS — K209 Esophagitis, unspecified without bleeding: Secondary | ICD-10-CM | POA: Insufficient documentation

## 2016-10-13 ENCOUNTER — Ambulatory Visit: Payer: Self-pay | Admitting: Family Medicine

## 2016-10-14 ENCOUNTER — Ambulatory Visit: Payer: Self-pay | Attending: Family Medicine | Admitting: Family Medicine

## 2016-10-14 ENCOUNTER — Encounter: Payer: Self-pay | Admitting: Family Medicine

## 2016-10-14 VITALS — BP 117/78 | HR 70 | Temp 98.1°F | Wt 165.6 lb

## 2016-10-14 DIAGNOSIS — K922 Gastrointestinal hemorrhage, unspecified: Secondary | ICD-10-CM

## 2016-10-14 DIAGNOSIS — Z7951 Long term (current) use of inhaled steroids: Secondary | ICD-10-CM | POA: Insufficient documentation

## 2016-10-14 DIAGNOSIS — Z7982 Long term (current) use of aspirin: Secondary | ICD-10-CM | POA: Insufficient documentation

## 2016-10-14 DIAGNOSIS — F1411 Cocaine abuse, in remission: Secondary | ICD-10-CM | POA: Insufficient documentation

## 2016-10-14 DIAGNOSIS — Z7902 Long term (current) use of antithrombotics/antiplatelets: Secondary | ICD-10-CM | POA: Insufficient documentation

## 2016-10-14 DIAGNOSIS — F1910 Other psychoactive substance abuse, uncomplicated: Secondary | ICD-10-CM

## 2016-10-14 DIAGNOSIS — F329 Major depressive disorder, single episode, unspecified: Secondary | ICD-10-CM | POA: Insufficient documentation

## 2016-10-14 DIAGNOSIS — I4891 Unspecified atrial fibrillation: Secondary | ICD-10-CM | POA: Insufficient documentation

## 2016-10-14 DIAGNOSIS — Z87891 Personal history of nicotine dependence: Secondary | ICD-10-CM | POA: Insufficient documentation

## 2016-10-14 DIAGNOSIS — J438 Other emphysema: Secondary | ICD-10-CM | POA: Insufficient documentation

## 2016-10-14 DIAGNOSIS — R12 Heartburn: Secondary | ICD-10-CM | POA: Insufficient documentation

## 2016-10-14 DIAGNOSIS — I10 Essential (primary) hypertension: Secondary | ICD-10-CM | POA: Insufficient documentation

## 2016-10-14 DIAGNOSIS — G8929 Other chronic pain: Secondary | ICD-10-CM | POA: Insufficient documentation

## 2016-10-14 DIAGNOSIS — M545 Low back pain: Secondary | ICD-10-CM | POA: Insufficient documentation

## 2016-10-14 DIAGNOSIS — M79672 Pain in left foot: Secondary | ICD-10-CM | POA: Insufficient documentation

## 2016-10-14 DIAGNOSIS — I2699 Other pulmonary embolism without acute cor pulmonale: Secondary | ICD-10-CM | POA: Insufficient documentation

## 2016-10-14 DIAGNOSIS — Z9049 Acquired absence of other specified parts of digestive tract: Secondary | ICD-10-CM | POA: Insufficient documentation

## 2016-10-14 MED ORDER — APIXABAN 5 MG PO TABS: 5.0000 mg | ORAL_TABLET | Freq: Two times a day (BID) | ORAL | 5 refills | Status: DC

## 2016-10-14 MED ORDER — DICLOFENAC SODIUM 1 % TD GEL: 4.0000 g | Freq: Four times a day (QID) | TRANSDERMAL | 2 refills | Status: DC

## 2016-10-14 MED ORDER — ALBUTEROL SULFATE HFA 108 (90 BASE) MCG/ACT IN AERS: INHALATION_SPRAY | RESPIRATORY_TRACT | 3 refills | Status: DC

## 2016-10-14 MED ORDER — ALBUTEROL SULFATE (2.5 MG/3ML) 0.083% IN NEBU: 2.5000 mg | INHALATION_SOLUTION | Freq: Four times a day (QID) | RESPIRATORY_TRACT | 1 refills | Status: DC | PRN

## 2016-10-14 MED ORDER — METHOCARBAMOL 500 MG PO TABS: 500.0000 mg | ORAL_TABLET | Freq: Every evening | ORAL | 2 refills | Status: DC | PRN

## 2016-10-14 MED ORDER — DULOXETINE HCL 30 MG PO CPEP: 30.0000 mg | ORAL_CAPSULE | Freq: Every day | ORAL | 3 refills | Status: DC

## 2016-10-14 MED ORDER — AMLODIPINE BESYLATE 10 MG PO TABS: 10.0000 mg | ORAL_TABLET | Freq: Every day | ORAL | 5 refills | Status: DC

## 2016-10-14 MED ORDER — BUDESONIDE-FORMOTEROL FUMARATE 80-4.5 MCG/ACT IN AERO: 2.0000 | INHALATION_SPRAY | Freq: Two times a day (BID) | RESPIRATORY_TRACT | 3 refills | Status: DC

## 2016-10-14 MED ORDER — ESOMEPRAZOLE MAGNESIUM 20 MG PO CPDR: 20.0000 mg | DELAYED_RELEASE_CAPSULE | Freq: Two times a day (BID) | ORAL | 5 refills | Status: DC

## 2016-10-14 MED FILL — AMLODIPINE BESYLATE 10 MG T: 10 | 30 days supply | Qty: 30 | Fill #0

## 2016-10-14 MED FILL — ?DULOXETINE HCL DR 30 MG CA: 30 MG | 30 days supply | Qty: 30 | Fill #0

## 2016-10-14 MED FILL — ALBUTEROL 0.083% INHAL SOLN: (2.5 MG/3ML | 15 days supply | Qty: 180 | Fill #0

## 2016-10-14 MED FILL — VOLTAREN 1% GEL: 1 | 6 days supply | Qty: 100 | Fill #0

## 2016-10-14 MED FILL — **SYMBICORT 80-4.5 MCG INHA: 80-4.5 | 15 days supply | Qty: 1 | Fill #0

## 2016-10-14 MED FILL — !VENTOLIN HFA INHALER: 108 (90 BAS | 25 days supply | Qty: 18 | Fill #0

## 2016-10-14 MED FILL — METHOCARBAMOL 500 MG TABLET: 500 | 30 days supply | Qty: 30 | Fill #0

## 2016-10-14 MED FILL — !ELIQUIS 5 MG TABLET: 5 | 30 days supply | Qty: 60 | Fill #0

## 2016-10-14 NOTE — Assessment & Plan Note (Signed)
Continue PPI Patient is scheduled to have EGD

## 2016-10-14 NOTE — Progress Notes (Signed)
Subjective:  Patient ID: Jeremy Booker, male    DOB: [birth year]  Age: 51 y.o. MRN: [id number]  CC: Back Pain; Gastroesophageal Reflux; and Depression   HPI Jeremy Booker has hx of congenital heart disease, recurrent PE, cocaine use with ischemic colitis s/p R hemicolectomy, he was recently incarcerated from 07/2016 to 10/10/2016. He presents today with Jeremy Booker from [place] [phone] mobile)  1. Hematochezia and heartburn: patient has been referred to [facility], GI who is planning EGD and colonoscopy. GI changed protonix to esomeprazole. GI requested clearance for patient to stop eliquis x 4 doses. He takes eliquis for A fib and PE. His last documented acute PE was on 08/06/16. I provided a letter stating he could stop eliquis after 3 months of anticoagulation, after 11/06/2016.   Of note, he had R hemicolectomy on 01/31/2016 for ischemic colitis there was no malignancy. He had a normal gallbladder US on 05/15/2016.    2. Chronic low back pain: for several years. He reports bilateral lower back pain. With spasms and pain down his R leg. He reports pain is associated with trouble sleeping and depressed mood. He has history of cocaine use. He reports he last used in 05/2016.   Lumbar MRI 10/04/2014: IMPRESSION: Unchanged, mild lower lumbar disc degeneration most notable at L4-5 where there is moderate left neural foraminal stenosis due to a disc protrusion.    Social History  Substance Use Topics  . Smoking status: Former Smoker    Packs/day: 0.00    Years: 30.00    Types: Cigarettes    Quit date: 09/29/2015  . Smokeless tobacco: Never Used  . Alcohol use No     Comment: Previously drank heavily on a daily basis.  Now drinks a few bottles of wine 1-2 x /wk (06/2014)    Outpatient Medications Prior to Visit  Medication Sig Dispense Refill  . acetaminophen-codeine (TYLENOL #3) 300-30 MG tablet Take 1 tablet by mouth 2 (two) times daily. 60 tablet 2    . albuterol (PROVENTIL) (2.5 MG/3ML) 0.083% nebulizer solution Take 3 mLs (2.5 mg total) by nebulization every 6 (six) hours as needed for wheezing or shortness of breath. 150 mL 1  . amLODipine (NORVASC) 10 MG tablet TAKE 1 TABLET BY MOUTH DAILY. 30 tablet 3  . apixaban (ELIQUIS) 5 MG TABS tablet Take 5 mg by mouth 2 (two) times daily.    Marland Kitchen aspirin EC 81 MG tablet Take 81 mg by mouth daily.    Marland Kitchen atorvastatin (LIPITOR) 40 MG tablet Take 40 mg by mouth daily.    . budesonide-formoterol (SYMBICORT) 80-4.5 MCG/ACT inhaler Inhale 2 puffs into the lungs 2 (two) times daily. 1 Inhaler 3  . esomeprazole (NEXIUM) 20 MG capsule Take 20 mg by mouth 2 (two) times daily before a meal.    . gabapentin (NEURONTIN) 300 MG capsule TAKE ONE CAPSULE BY MOUTH THREE TIMES A DAY 90 capsule 3  . nitroGLYCERIN (NITROSTAT) 0.4 MG SL tablet Place 1 tablet (0.4 mg total) under the tongue every 5 (five) minutes as needed for chest pain. Daily max- 3 doses (Patient not taking: Reported on 05/22/2016) 50 tablet 1  . VENTOLIN HFA 108 (90 Base) MCG/ACT inhaler INHALE 2 PUFFS INTO THE LUNGS EVERY 6 HOURS AS NEEDED FOR WHEEZING OR SHORTNESS OF BREATH. 18 g 1   Facility-Administered Medications Prior to Visit  Medication Dose Route Frequency Provider Last Rate Last Dose  . albuterol (PROVENTIL) (2.5 MG/3ML) 0.083% nebulizer solution 2.5  mg  2.5 mg Nebulization Once Jeremy Avis, MD        ROS Review of Systems  Constitutional: Negative for chills, fatigue, fever and unexpected weight change.  Eyes: Negative for visual disturbance.  Respiratory: Negative for cough and shortness of breath.   Cardiovascular: Positive for chest pain. Negative for palpitations and leg swelling.  Gastrointestinal: Positive for abdominal pain and blood in stool. Negative for abdominal distention, anal bleeding, constipation, diarrhea, nausea and vomiting.  Endocrine: Negative for polydipsia, polyphagia and polyuria.  Musculoskeletal: Positive  for arthralgias (dorsum of L foot ) and back pain. Negative for gait problem, myalgias and neck pain.  Skin: Negative for rash.  Allergic/Immunologic: Negative for immunocompromised state.  Hematological: Negative for adenopathy. Does not bruise/bleed easily.  Psychiatric/Behavioral: Positive for sleep disturbance. Negative for dysphoric mood and suicidal ideas. The patient is not nervous/anxious.     Objective:  BP 117/78   Pulse 70   Temp 98.1 F (36.7 C) (Oral)   Wt 165 lb 9.6 oz (75.1 kg)   SpO2 99%   BMI 24.45 kg/m   BP/Weight 10/14/2016 05/22/2016 50/07/5463  Systolic BP 681 275 170  Diastolic BP 78 84 70  Wt. (Lbs) 165.6 172.8 167.25  BMI 24.45 25.52 24.7    Physical Exam  Constitutional: He appears well-developed and well-nourished. No distress.  HENT:  Head: Normocephalic and atraumatic.  Neck: Normal range of motion. Neck supple.  Cardiovascular: Normal rate, regular rhythm and intact distal pulses.  Exam reveals gallop. Exam reveals no friction rub.   No murmur heard. Pulmonary/Chest: Effort normal and breath sounds normal.  Genitourinary: Rectum normal, prostate normal and penis normal. Rectal exam shows guaiac negative stool.  Musculoskeletal: He exhibits no edema.  Back Exam: Back: Normal Curvature, no deformities or CVA tenderness  Paraspinal Tenderness: b/l lumbar   LE Strength 4/5  LE Sensation: in tact  LE Reflexes 2+ and symmetric     Neurological: He is alert. Gait (walks with a cane) abnormal.  Skin: Skin is warm and dry. No rash noted. No erythema.  Psychiatric: He has a normal mood and affect.    Assessment & Plan:  Jeremy Booker was seen today for back pain, gastroesophageal reflux and depression.  Diagnoses and all orders for this visit:  Heartburn -     esomeprazole (NEXIUM) 20 MG capsule; Take 1 capsule (20 mg total) by mouth 2 (two) times daily before a meal.  Other acute pulmonary embolism without acute cor pulmonale (HCC) -     apixaban  (ELIQUIS) 5 MG TABS tablet; Take 1 tablet (5 mg total) by mouth 2 (two) times daily.  Essential hypertension -     amLODipine (NORVASC) 10 MG tablet; Take 1 tablet (10 mg total) by mouth daily.  Other emphysema (HCC) -     albuterol (PROVENTIL) (2.5 MG/3ML) 0.083% nebulizer solution; Take 3 mLs (2.5 mg total) by nebulization every 6 (six) hours as needed for wheezing or shortness of breath. -     albuterol (VENTOLIN HFA) 108 (90 Base) MCG/ACT inhaler; INHALE 2 PUFFS INTO THE LUNGS EVERY 6 HOURS AS NEEDED FOR WHEEZING OR SHORTNESS OF BREATH. -     budesonide-formoterol (SYMBICORT) 80-4.5 MCG/ACT inhaler; Inhale 2 puffs into the lungs 2 (two) times daily.  Chronic midline low back pain without sciatica -     DULoxetine (CYMBALTA) 30 MG capsule; Take 1 capsule (30 mg total) by mouth daily. -     methocarbamol (ROBAXIN) 500 MG tablet; Take 1 tablet (500 mg  total) by mouth at bedtime as needed for muscle spasms.  Left foot pain -     diclofenac sodium (VOLTAREN) 1 % GEL; Apply 4 g topically 4 (four) times daily.   There are no diagnoses linked to this encounter.  No orders of the defined types were placed in this encounter.   Follow-up: Return in about 4 weeks (around 11/11/2016) for chronic back pain.   Boykin Nearing MD

## 2016-10-14 NOTE — Assessment & Plan Note (Signed)
Chronic pain with history of cocaine abuse  cymbalta  Topical diclofenac gel Avoiding narcotic pain medicine given significant substance abuse history

## 2016-10-14 NOTE — Assessment & Plan Note (Signed)
Patient scheduled for colonoscopy.

## 2016-10-14 NOTE — Patient Instructions (Addendum)
Qais was seen today for back pain, gastroesophageal reflux and depression.  Diagnoses and all orders for this visit:  Heartburn -     esomeprazole (NEXIUM) 20 MG capsule; Take 1 capsule (20 mg total) by mouth 2 (two) times daily before a meal.  Other acute pulmonary embolism without acute cor pulmonale (HCC) -     apixaban (ELIQUIS) 5 MG TABS tablet; Take 1 tablet (5 mg total) by mouth 2 (two) times daily.  Essential hypertension -     amLODipine (NORVASC) 10 MG tablet; Take 1 tablet (10 mg total) by mouth daily.  Other emphysema (HCC) -     albuterol (PROVENTIL) (2.5 MG/3ML) 0.083% nebulizer solution; Take 3 mLs (2.5 mg total) by nebulization every 6 (six) hours as needed for wheezing or shortness of breath. -     albuterol (VENTOLIN HFA) 108 (90 Base) MCG/ACT inhaler; INHALE 2 PUFFS INTO THE LUNGS EVERY 6 HOURS AS NEEDED FOR WHEEZING OR SHORTNESS OF BREATH. -     budesonide-formoterol (SYMBICORT) 80-4.5 MCG/ACT inhaler; Inhale 2 puffs into the lungs 2 (two) times daily.  Chronic midline low back pain without sciatica -     DULoxetine (CYMBALTA) 30 MG capsule; Take 1 capsule (30 mg total) by mouth daily. -     methocarbamol (ROBAXIN) 500 MG tablet; Take 1 tablet (500 mg total) by mouth at bedtime as needed for muscle spasms.  Left foot pain -     diclofenac sodium (VOLTAREN) 1 % GEL; Apply 4 g topically 4 (four) times daily.   I called your GI doctor they have received my eliquis recommendations and will call you to schedule EGD and colonoscopy to be done some time after 11/06/16.  F/u in 6 week for chronic pain and depression   Dr. Adrian Blackwater

## 2016-10-15 ENCOUNTER — Telehealth: Payer: Self-pay | Admitting: Family Medicine

## 2016-10-15 NOTE — Telephone Encounter (Signed)
Pt. Called requesting an updated letter for disability. Pt. Would like a letter like the one that was given to him on 03/10/17. Please f/u with pt.

## 2016-10-16 NOTE — Telephone Encounter (Signed)
Pt was called and informed of letter being ready for pick up.

## 2016-10-16 NOTE — Telephone Encounter (Signed)
Pt. Called again requesting a letter from PCP to take to disability. Pt. States he needs an updated letter like the one that was given to him on 03/10/16. Please f/u

## 2016-10-16 NOTE — Telephone Encounter (Signed)
letter updates and ready for pick up

## 2016-10-16 NOTE — Telephone Encounter (Signed)
Will route to PCP 

## 2016-10-16 NOTE — Assessment & Plan Note (Signed)
In remission per patient 

## 2016-10-20 ENCOUNTER — Telehealth: Payer: Self-pay | Admitting: Family Medicine

## 2016-10-20 DIAGNOSIS — G8929 Other chronic pain: Secondary | ICD-10-CM

## 2016-10-20 DIAGNOSIS — M545 Low back pain: Principal | ICD-10-CM

## 2016-10-20 NOTE — Telephone Encounter (Signed)
Patient called requesting an appointment with PCP regarding ongoing issues (back pain)  Patient also stated he needed to speak to pcp regarding letter for disability and questions about medications.  Patient was seen on 6/5, could you please advised.   Prior letter request was mailed to patient.

## 2016-10-22 MED ORDER — GABAPENTIN 300 MG PO CAPS: 300.0000 mg | ORAL_CAPSULE | Freq: Three times a day (TID) | ORAL | 3 refills | Status: DC

## 2016-10-22 MED FILL — GABAPENTIN 300 MG CAPSULE: 300 | 30 days supply | Qty: 90 | Fill #0

## 2016-10-22 NOTE — Telephone Encounter (Signed)
Called to patient Verified name and DOB He reports strong back pain  He reports being drug free  He request refill of tramadol   Plan: UDS needed to restart tramadol, patient will come and give sample  Informed patient that Nexium and robaxin were refilled and should be at pharmacy Gabapentin refilled  Dental referral was placed  Placing neurosurgery referral   He requested updated letter for disability and appointment reminder be mailed to him.  Mailed.

## 2016-10-27 ENCOUNTER — Other Ambulatory Visit: Payer: Self-pay

## 2016-10-27 ENCOUNTER — Other Ambulatory Visit: Payer: Self-pay | Admitting: Family Medicine

## 2016-10-27 ENCOUNTER — Ambulatory Visit: Payer: Self-pay | Attending: Family Medicine

## 2016-10-27 DIAGNOSIS — G8929 Other chronic pain: Secondary | ICD-10-CM

## 2016-10-27 DIAGNOSIS — M545 Low back pain, unspecified: Secondary | ICD-10-CM

## 2016-10-27 DIAGNOSIS — R12 Heartburn: Secondary | ICD-10-CM

## 2016-10-27 MED ORDER — ESOMEPRAZOLE MAGNESIUM 20 MG PO CPDR: 20.0000 mg | DELAYED_RELEASE_CAPSULE | Freq: Two times a day (BID) | ORAL | 5 refills | Status: DC

## 2016-10-27 MED ORDER — METHOCARBAMOL 750 MG PO TABS: 750.0000 mg | ORAL_TABLET | Freq: Every evening | ORAL | 2 refills | Status: DC | PRN

## 2016-10-27 MED FILL — ?ESOMEPRAZOLE MAG DR 40 MG: 40 MG | 60 days supply | Qty: 60 | Fill #0

## 2016-10-27 MED FILL — METHOCARBAMOL 750 MG TABLET: 750 | 30 days supply | Qty: 30 | Fill #0

## 2016-10-27 NOTE — Progress Notes (Signed)
Patient here for lab visit  

## 2016-10-28 ENCOUNTER — Other Ambulatory Visit: Payer: Self-pay | Admitting: Family Medicine

## 2016-10-28 ENCOUNTER — Telehealth: Payer: Self-pay | Admitting: Family Medicine

## 2016-10-28 DIAGNOSIS — M545 Low back pain, unspecified: Secondary | ICD-10-CM

## 2016-10-28 DIAGNOSIS — G8929 Other chronic pain: Secondary | ICD-10-CM

## 2016-10-28 LAB — DRUG SCREEN, URINE
Amphetamines, Urine: NEGATIVE ng/mL
BARBITURATE SCREEN URINE: NEGATIVE ng/mL
BENZODIAZEPINE QUANT UR: NEGATIVE ng/mL
CANNABINOID QUANT UR: NEGATIVE ng/mL
Cocaine (Metab.): NEGATIVE ng/mL
Opiate Quant, Ur: NEGATIVE ng/mL
PCP Quant, Ur: NEGATIVE ng/mL

## 2016-10-28 NOTE — Telephone Encounter (Signed)
Called back to patient Continued on previous phone note

## 2016-10-28 NOTE — Telephone Encounter (Signed)
Pt called requesting a return call from Dr Adrian Blackwater, states that they were on a call and the call dropped.

## 2016-10-28 NOTE — Assessment & Plan Note (Addendum)
Chronic low back pain, 09/2014  MRI findings of  mild lower lumbar disc degeneration most notable at L4-5 where there is moderate left neural foraminal stenosis due to a disc Protrusion.  He has history of cocaine abuse  Negative urine drug screen This is the second documented negative urine drug screen (prior to this was 08/05/16)  Patient requesting opioid pain medicine   Patient taking Cymbalta 30 mg daily, gabapentin, robaxin and voltaren gel for chronic pain in back  Has history of cocaine abuse so trying to avoid chronic narcotics  He has neurosurgery appt next month  Plan, no opoid at this time, will get neurosurgery recommendations first.   Patient called

## 2016-10-28 NOTE — Telephone Encounter (Signed)
Called to patient  verified name and DOB Negative urine drug screen This is the second documented negative urine drug screen (prior to this was 08/05/16)   Patient taking Cymbalta 30 mg daily, gabapentin, robaxin and voltaren gel for chronic pain in back  Has history of cocaine abuse so trying to avoid opioids  He has neurosurgery appt next month, 11/13/2016  Plan, no opoid at this time, will get neurosurgery recommendations first.     Patient reports that he is in a lot of pain on a regular basis

## 2016-10-28 NOTE — Telephone Encounter (Signed)
Called back to patient verified name and DOB  He agrees to wait to see neurosurgeon until possibly restarting the tramadol. As he has tried both tylenol #3 and 4 and tramadol without significant improvement.   He reports feet and leg swelling since being in jail in March. He reports it is continuing. Bottom and top of feet hurt. Both leg swelling. Swelling is worse on R.  He would prefer to see me for this problem on 11/06/16 at please schedule appt for foot and leg swelling.

## 2016-10-29 NOTE — Telephone Encounter (Signed)
Called pt. And scheduled appt. With his PCP on 11/06/16 at 10:45.

## 2016-10-30 IMAGING — CT CT ANGIO CHEST
2 of 11 series · 18 of 46 positions shown · IV contrast (omnipaque)
Comparison: Portable chest obtained earlier today.

CLINICAL DATA: Chest pain and shortness of breath since 06/29/2014.
Chest pressure. Smoker.

EXAM:
CT ANGIOGRAPHY CHEST WITH CONTRAST
TECHNIQUE: Multidetector CT imaging of the chest was performed using the
standard protocol prior to and during during bolus administration of
intravenous contrast. Multiplanar CT image reconstructions and MIPs
were obtained to evaluate the vascular anatomy.
CONTRAST:  100mL OMNIPAQUE IOHEXOL 350 MG/ML SOLN

[Series 7: dissection 2.0 i30f 1 · axial · 0.68mm/px · z∈[+1172,+1426]mm · 15 of 143 slices shown]
[im 8/143  lung]
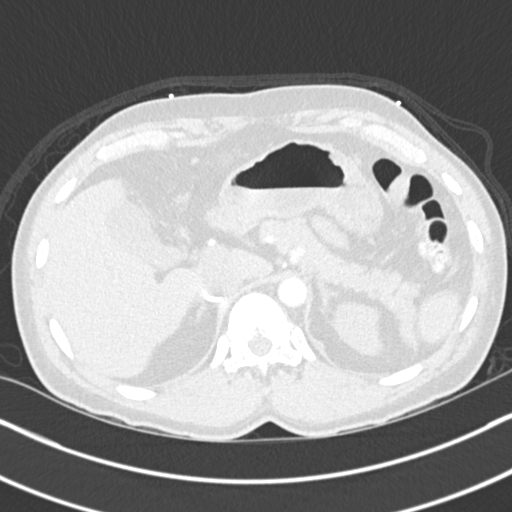
[im 15/143  soft-tissue]
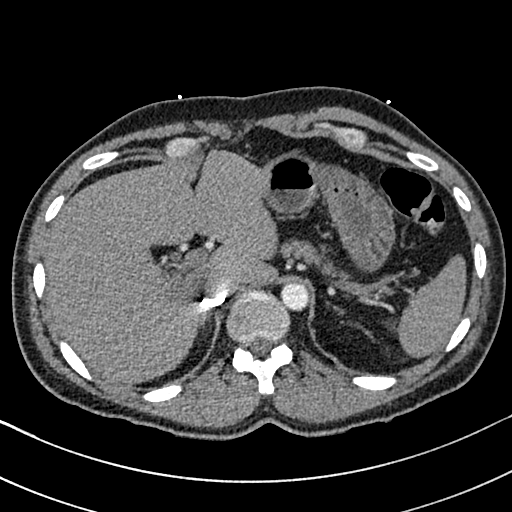
[im 30/143  lung]
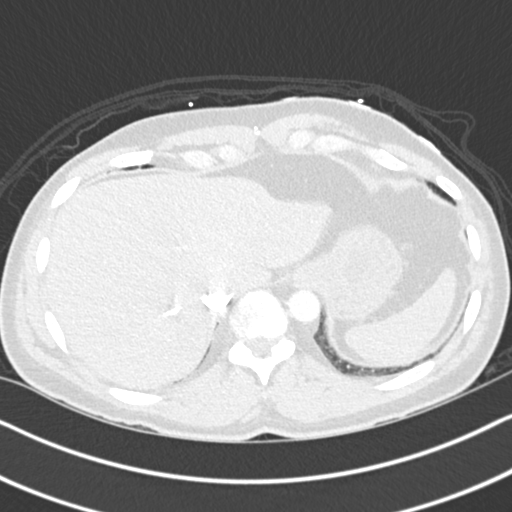
[im 38/143  soft-tissue]
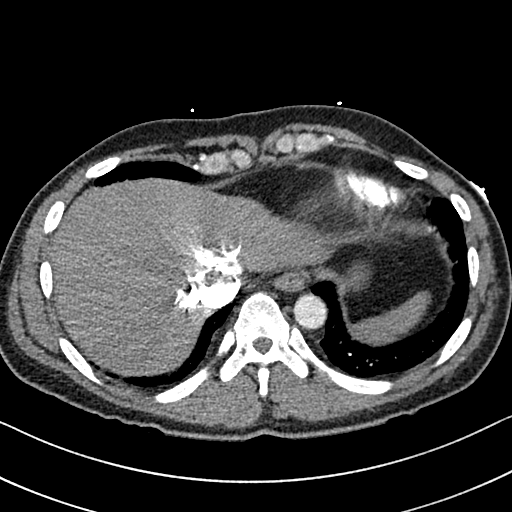
[im 45/143  lung]
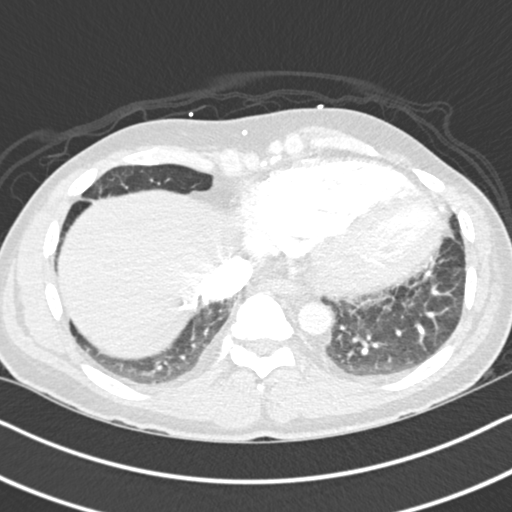
[im 53/143  soft-tissue]
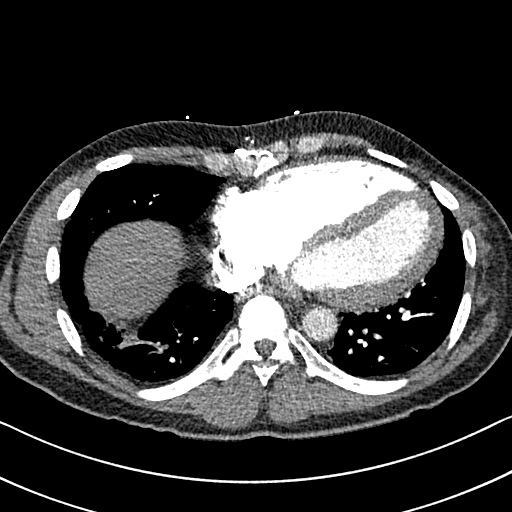
[im 60/143  lung]
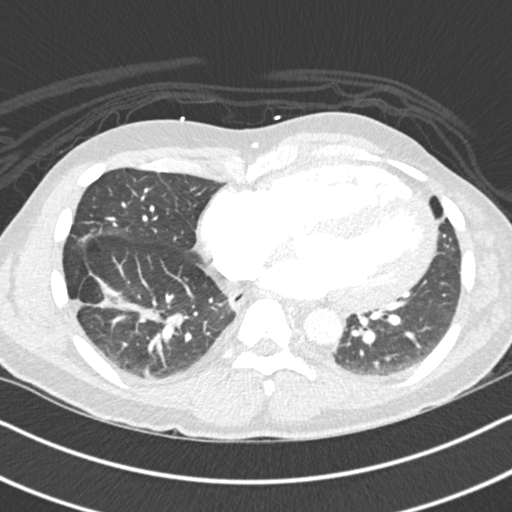
[im 75/143  soft-tissue]
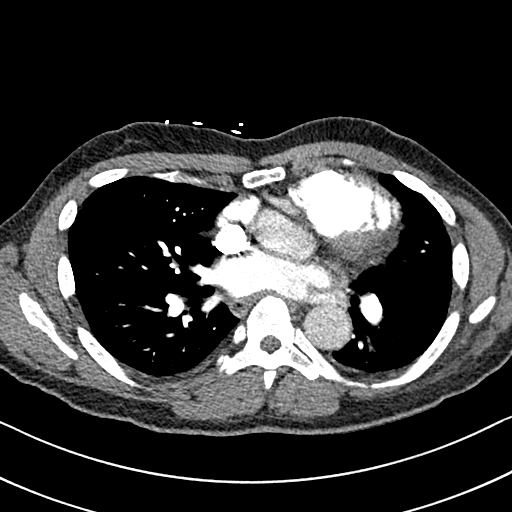
[im 83/143  lung]
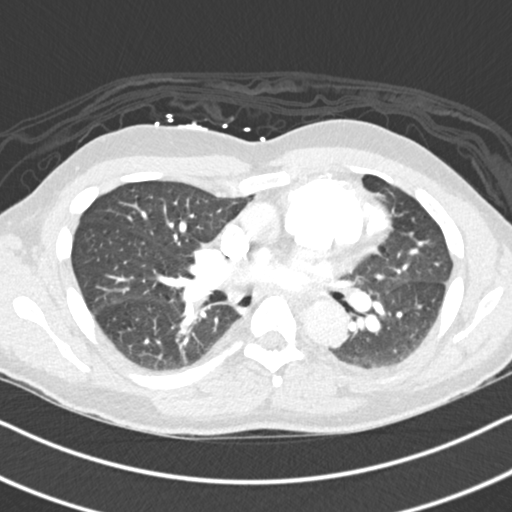
[im 90/143  soft-tissue]
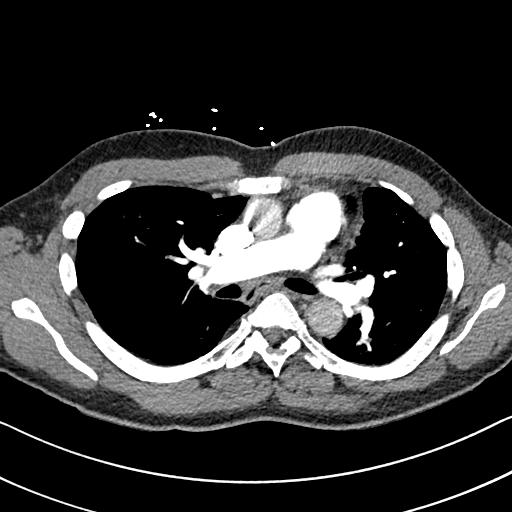
[im 98/143  lung]
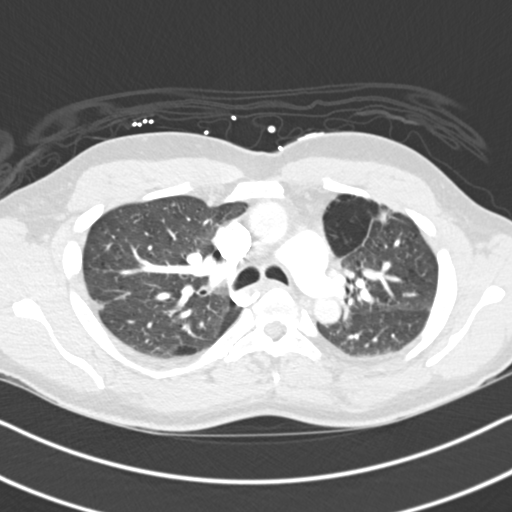
[im 105/143  soft-tissue]
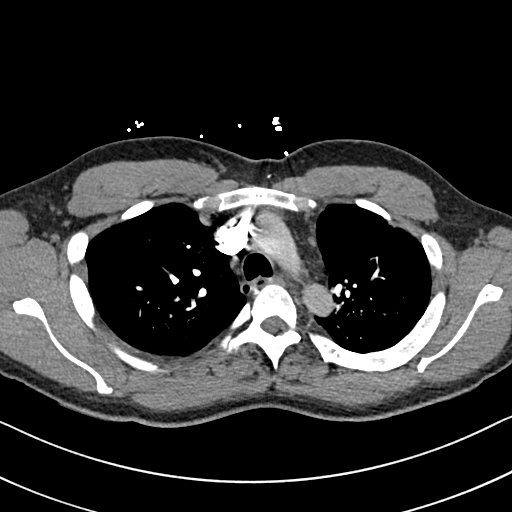
[im 120/143  lung]
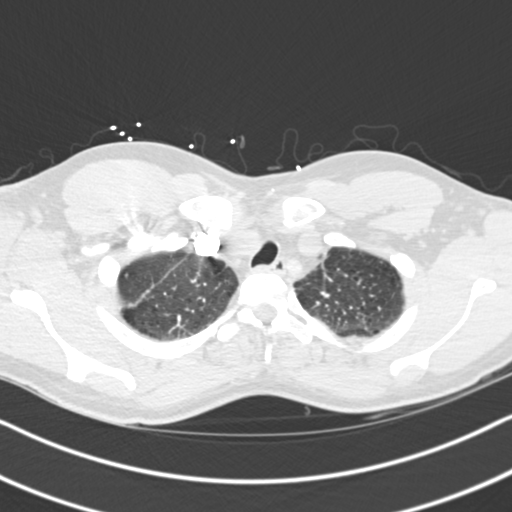
[im 128/143  soft-tissue]
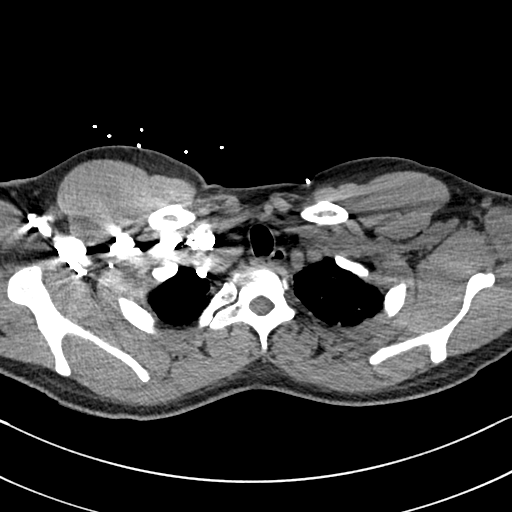
[im 135/143  lung]
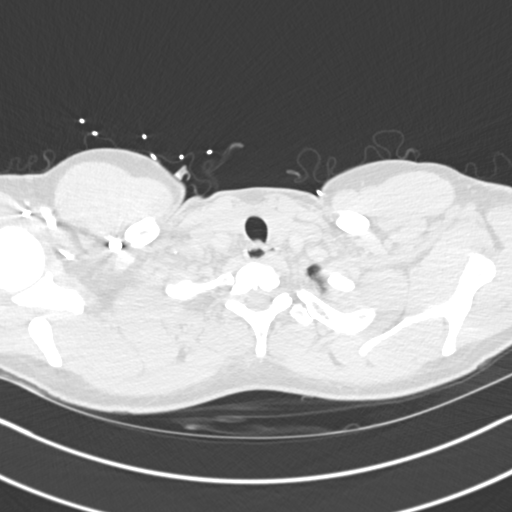

[Series 9: coronal mpr · coronal · 0.57mm/px · 3 of 116 slices shown]
[im 29/116  soft-tissue]
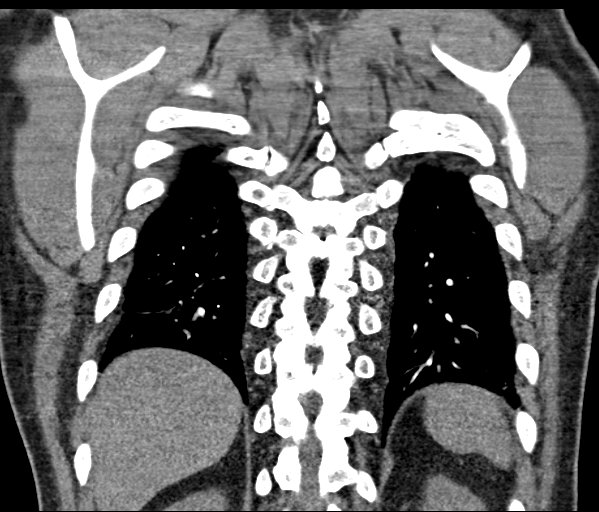
[im 58/116  soft-tissue]
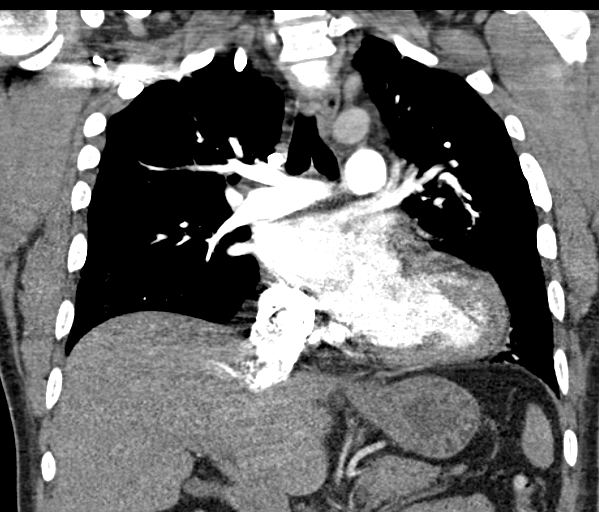
[im 87/116  soft-tissue]
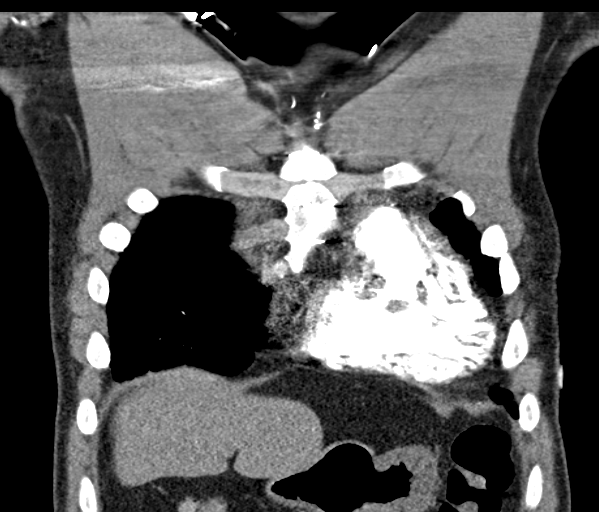

[18 of 46 positions shown; findings below may reference images not displayed]

FINDINGS: There is a filling defect and a right lower lobe pulmonary artery,
occluding the distal portion of the artery. On axial image number
76, there is a suggestion of a small filling defect within a left
lower lobe pulmonary artery. However, this appears to be artifactual
due to 8 be and in the artery, best seen on the sagittal images. No
other pulmonary arterial filling defects are seen.

There is a 1.3 x 0.7 cm irregular density in the right upper lobe on
image number 33, measuring 1.3 cm in length on sagittal image number
54. Also noted are bullous changes and areas of hyper expansion in
both lungs. There is some linear atelectasis or scarring in the
right lower lobe and, to a lesser degree, in the anterior portions
of the left upper lobe.

No enlarged lymph nodes. The examination was tailored for evaluation
of the pulmonary arteries, with an adequate opacification of the
aorta to evaluate for dissection. Mild thoracic spine degenerative
changes and mild scoliosis. Median sternotomy wires. Unremarkable
upper abdomen.

Review of the MIP images confirms the above findings.
IMPRESSION: 1. Right lower lobe pulmonary embolism.
2. 1.3 x 1.3 x 0.7 cm probable irregular scar in the right upper
lobe. A small lung carcinoma cannot be excluded. Therefore, a
followup chest CT is recommended in 6 months.
3. COPD.
Critical Value/emergent results were called by telephone at the time
of interpretation on 07/02/2014 at [DATE] to Ray, the patient's
nurse, who verbally acknowledged these results. He stated that he
would inform Dr. Tiger.

## 2016-11-03 ENCOUNTER — Encounter: Payer: Self-pay | Admitting: Internal Medicine

## 2016-11-03 ENCOUNTER — Ambulatory Visit: Payer: Self-pay | Attending: Internal Medicine | Admitting: Internal Medicine

## 2016-11-03 VITALS — BP 119/88 | HR 62 | Temp 98.1°F | Resp 16 | Wt 165.6 lb

## 2016-11-03 DIAGNOSIS — I10 Essential (primary) hypertension: Secondary | ICD-10-CM | POA: Insufficient documentation

## 2016-11-03 DIAGNOSIS — Q249 Congenital malformation of heart, unspecified: Secondary | ICD-10-CM | POA: Insufficient documentation

## 2016-11-03 DIAGNOSIS — I4510 Unspecified right bundle-branch block: Secondary | ICD-10-CM | POA: Insufficient documentation

## 2016-11-03 DIAGNOSIS — G8929 Other chronic pain: Secondary | ICD-10-CM | POA: Insufficient documentation

## 2016-11-03 DIAGNOSIS — Z86711 Personal history of pulmonary embolism: Secondary | ICD-10-CM | POA: Insufficient documentation

## 2016-11-03 DIAGNOSIS — Z7982 Long term (current) use of aspirin: Secondary | ICD-10-CM | POA: Insufficient documentation

## 2016-11-03 DIAGNOSIS — F17200 Nicotine dependence, unspecified, uncomplicated: Secondary | ICD-10-CM | POA: Insufficient documentation

## 2016-11-03 DIAGNOSIS — G5793 Unspecified mononeuropathy of bilateral lower limbs: Secondary | ICD-10-CM

## 2016-11-03 DIAGNOSIS — I452 Bifascicular block: Secondary | ICD-10-CM | POA: Insufficient documentation

## 2016-11-03 DIAGNOSIS — M25562 Pain in left knee: Secondary | ICD-10-CM | POA: Insufficient documentation

## 2016-11-03 DIAGNOSIS — I517 Cardiomegaly: Secondary | ICD-10-CM | POA: Insufficient documentation

## 2016-11-03 DIAGNOSIS — K559 Vascular disorder of intestine, unspecified: Secondary | ICD-10-CM | POA: Insufficient documentation

## 2016-11-03 DIAGNOSIS — M7989 Other specified soft tissue disorders: Secondary | ICD-10-CM | POA: Insufficient documentation

## 2016-11-03 DIAGNOSIS — M79672 Pain in left foot: Secondary | ICD-10-CM | POA: Insufficient documentation

## 2016-11-03 DIAGNOSIS — K922 Gastrointestinal hemorrhage, unspecified: Secondary | ICD-10-CM | POA: Insufficient documentation

## 2016-11-03 DIAGNOSIS — I4891 Unspecified atrial fibrillation: Secondary | ICD-10-CM | POA: Insufficient documentation

## 2016-11-03 DIAGNOSIS — M545 Low back pain: Secondary | ICD-10-CM | POA: Insufficient documentation

## 2016-11-03 DIAGNOSIS — F1410 Cocaine abuse, uncomplicated: Secondary | ICD-10-CM | POA: Insufficient documentation

## 2016-11-03 DIAGNOSIS — M79671 Pain in right foot: Secondary | ICD-10-CM | POA: Insufficient documentation

## 2016-11-03 DIAGNOSIS — J449 Chronic obstructive pulmonary disease, unspecified: Secondary | ICD-10-CM | POA: Insufficient documentation

## 2016-11-03 DIAGNOSIS — R062 Wheezing: Secondary | ICD-10-CM | POA: Insufficient documentation

## 2016-11-03 DIAGNOSIS — F1010 Alcohol abuse, uncomplicated: Secondary | ICD-10-CM | POA: Insufficient documentation

## 2016-11-03 DIAGNOSIS — G629 Polyneuropathy, unspecified: Secondary | ICD-10-CM | POA: Insufficient documentation

## 2016-11-03 DIAGNOSIS — R911 Solitary pulmonary nodule: Secondary | ICD-10-CM | POA: Insufficient documentation

## 2016-11-03 DIAGNOSIS — Z886 Allergy status to analgesic agent status: Secondary | ICD-10-CM | POA: Insufficient documentation

## 2016-11-03 MED ORDER — GABAPENTIN 300 MG PO CAPS: 600.0000 mg | ORAL_CAPSULE | Freq: Two times a day (BID) | ORAL | 6 refills | Status: DC

## 2016-11-03 MED ORDER — ATORVASTATIN CALCIUM 40 MG PO TABS: 40.0000 mg | ORAL_TABLET | Freq: Every day | ORAL | 11 refills | Status: DC

## 2016-11-03 MED ORDER — ASPIRIN EC 81 MG PO TBEC: 81.0000 mg | DELAYED_RELEASE_TABLET | Freq: Every day | ORAL | 1 refills | Status: DC

## 2016-11-03 MED FILL — ATORVASTATIN 40 MG TABLET: 40 | 30 days supply | Qty: 30 | Fill #0

## 2016-11-03 NOTE — Patient Instructions (Signed)
Increase gabapentin to 600 mg twice a day

## 2016-11-03 NOTE — Progress Notes (Signed)
Patient ID: Jeremy Booker, male    DOB: 1965-09-27  MRN: [id number]  CC:  [email or web address]   Subjective:  Jeremy Booker is a 51 y.o. male who presents for UC visit. His fiance, Jeremy Booker, is with him. His concerns today include:  Jeremy Booker has hx of congenital heart disease, recurrent PE, cocaine use with ischemic colitis s/p R hemicolectomy, he was recently incarcerated from 07/2016 to 10/10/2016.  Pt c/o swelling in both legs and feet x 1 mth.  Started in LT foot. Told PCP about it when seen on 10/14/2016.  Given Voltaren cream. -On further questioning patient reveals that it's really his feet that are bothering him. Feet are burning and very sensitive to touch in this makes his ankles and legs feel swollen. He is on gabapentin 300 mg 3 times a day Patient Active Problem List   Diagnosis Date Noted  . Left foot pain 10/14/2016  . Heartburn 10/14/2016  . Cocaine abuse 05/26/2016  . Lower GI bleed 05/22/2016  . S/P right hemicolectomy 02/20/2016  . Atrial fibrillation ([place]) 02/18/2016  . Current smoker 06/18/2015  . Left anterior knee pain 05/04/2015  . RVH (right ventricular hypertrophy) 03/08/2015  . Chronic low back pain 01/26/2015  . Dyspnea 12/04/2014  . Solitary pulmonary nodule 07/20/2014  . Pulmonary embolus ([place]) 07/03/2014  . Atypical chest pain 07/02/2014  . 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 Drug abuse   . ETOH abuse   . Hypertension   . COPD (chronic obstructive pulmonary disease) ([place])   . Congenital heart disease      Current Outpatient Prescriptions on File Prior to Visit  Medication Sig Dispense Refill  . albuterol (PROVENTIL) (2.5 MG/3ML) 0.083% nebulizer solution Take 3 mLs (2.5 mg total) by nebulization every 6 (six) hours as needed for wheezing or shortness of breath. 150 mL 1  . albuterol (VENTOLIN HFA) 108 (90 Base) MCG/ACT inhaler INHALE 2 PUFFS INTO THE LUNGS EVERY 6 HOURS AS NEEDED FOR WHEEZING OR SHORTNESS  OF BREATH. 18 g 3  . amLODipine (NORVASC) 10 MG tablet Take 1 tablet (10 mg total) by mouth daily. 30 tablet 5  . apixaban (ELIQUIS) 5 MG TABS tablet Take 1 tablet (5 mg total) by mouth 2 (two) times daily. 60 tablet 5  . budesonide-formoterol (SYMBICORT) 80-4.5 MCG/ACT inhaler Inhale 2 puffs into the lungs 2 (two) times daily. 1 Inhaler 3  . DULoxetine (CYMBALTA) 30 MG capsule Take 1 capsule (30 mg total) by mouth daily. 30 capsule 3  . esomeprazole (NEXIUM) 20 MG capsule Take 1 capsule (20 mg total) by mouth 2 (two) times daily before a meal. 60 capsule 5  . gabapentin (NEURONTIN) 300 MG capsule Take 1 capsule (300 mg total) by mouth 3 (three) times daily. 90 capsule 3  . methocarbamol (ROBAXIN) 750 MG tablet Take 1 tablet (750 mg total) by mouth at bedtime as needed for muscle spasms. 30 tablet 2  . aspirin EC 81 MG tablet Take 81 mg by mouth daily.    Marland Kitchen atorvastatin (LIPITOR) 40 MG tablet Take 40 mg by mouth daily.    . diclofenac sodium (VOLTAREN) 1 % GEL Apply 4 g topically 4 (four) times daily. (Patient not taking: Reported on 11/03/2016) 100 g 2  . nitroGLYCERIN (NITROSTAT) 0.4 MG SL tablet Place 1 tablet (0.4 mg total) under the tongue every 5 (five) minutes as needed for chest pain. Daily max- 3 doses (Patient not taking: Reported on 05/22/2016) 50 tablet 1  Current Facility-Administered Medications on File Prior to Visit  Medication Dose Route Frequency Provider Last Rate Last Dose  . albuterol (PROVENTIL) (2.5 MG/3ML) 0.083% nebulizer solution 2.5 mg  2.5 mg Nebulization Once Boykin Nearing, MD        Allergies  Allergen Reactions  . Buprenorphine Hcl Shortness Of Breath  . Morphine And Related Shortness Of Breath  . Baclofen   . Dilaudid [Hydromorphone Hcl] Itching  . Morphine Palpitations  . Naprosyn [Naproxen] Itching and Rash    Patient has taken ibuprofen and tolerates that just fine Patient has taken ibuprofen and tolerates that just fine     ROS: Review of Systems  as stated above PHYSICAL EXAM: BP 119/88 (BP Location: Right Arm, Patient Position: Sitting, Cuff Size: Normal)   Pulse 62   Temp 98.1 F (36.7 C) (Oral)   Resp 16   Wt 165 lb 9.6 oz (75.1 kg)   BMI 24.45 kg/m   Physical Exam  General appearance - alert, well appearing, and in no distress Mental status - alert, oriented to person, place, and time, normal mood, behavior, speech, dress, motor activity, and thought processes Neurological -lower legs close to the ankle and dorsum of the feet very sensitive to touch. However when distracted patient displays no signs of tenderness. Very trace edema in the lower legs. No edema of the feet or ankles. Good dorsalis pedis pulses. Feet are warm  ASSESSMENT AND PLAN: 1. Neuropathic pain of both feet -Change Neurontin to 600 mg twice a day. Check vitamin B12 level. Keep follow-up appointment with PCP next month. - gabapentin (NEURONTIN) 300 MG capsule; Take 2 capsules (600 mg total) by mouth 2 (two) times daily.  Dispense: 120 capsule; Refill: 6 - Vitamin B12   Patient was given the opportunity to ask questions.  Patient verbalized understanding of the plan and was able to repeat key elements of the plan.   No orders of the defined types were placed in this encounter.    Requested Prescriptions    No prescriptions requested or ordered in this encounter    Future Appointments Date Time Provider [place]  11/06/2016 10:45 AM Boykin Nearing, MD [facility] None  12/05/2016 11:15 AM Boykin Nearing, MD [facility] None  02/03/2017 3:15 PM Chesley Mires, MD [facility] None    Karle Plumber, MD, Rosalita Chessman

## 2016-11-04 LAB — VITAMIN B12: Vitamin B-12: 417 pg/mL (ref 232–1245)

## 2016-11-05 ENCOUNTER — Telehealth: Payer: Self-pay

## 2016-11-05 NOTE — Telephone Encounter (Signed)
Contacted pt to go over lab results pt didn't answer lvm informing pt of results and if he has any questions or concerns to give [place] a call  If pt calls back please give results: B12 level is normal.

## 2016-11-06 ENCOUNTER — Encounter: Payer: Self-pay | Admitting: Family Medicine

## 2016-11-06 ENCOUNTER — Ambulatory Visit: Payer: Self-pay | Attending: Family Medicine | Admitting: Family Medicine

## 2016-11-06 VITALS — BP 119/71 | HR 69 | Temp 97.9°F | Wt 168.6 lb

## 2016-11-06 DIAGNOSIS — Z87891 Personal history of nicotine dependence: Secondary | ICD-10-CM | POA: Insufficient documentation

## 2016-11-06 DIAGNOSIS — Z7982 Long term (current) use of aspirin: Secondary | ICD-10-CM | POA: Insufficient documentation

## 2016-11-06 DIAGNOSIS — M7989 Other specified soft tissue disorders: Secondary | ICD-10-CM

## 2016-11-06 DIAGNOSIS — M5441 Lumbago with sciatica, right side: Secondary | ICD-10-CM

## 2016-11-06 DIAGNOSIS — G8929 Other chronic pain: Secondary | ICD-10-CM

## 2016-11-06 DIAGNOSIS — M5442 Lumbago with sciatica, left side: Secondary | ICD-10-CM

## 2016-11-06 DIAGNOSIS — Z9049 Acquired absence of other specified parts of digestive tract: Secondary | ICD-10-CM | POA: Insufficient documentation

## 2016-11-06 DIAGNOSIS — Z79899 Other long term (current) drug therapy: Secondary | ICD-10-CM | POA: Insufficient documentation

## 2016-11-06 LAB — POCT GLYCOSYLATED HEMOGLOBIN (HGB A1C): Hemoglobin A1C: 5.5

## 2016-11-06 MED ORDER — PREDNISONE 10 MG PO TABS: ORAL_TABLET | ORAL | 0 refills | Status: DC

## 2016-11-06 MED ORDER — WALKER MISC: 1.0000 | Freq: Every day | 0 refills | Status: DC | PRN

## 2016-11-06 MED FILL — ?PREDNISONE 10 MG TABLET: 10 | 9 days supply | Qty: 21 | Fill #0

## 2016-11-06 NOTE — Progress Notes (Signed)
Subjective:  Patient ID: Jeremy Booker, male    DOB: 08-26-1965  Age: 51 y.o. MRN: [id number]  CC: Foot Swelling   HPI [name] has hx of congenital heart disease, recurrent PE, cocaine use with ischemic colitis s/p R hemicolectomy, he was recently incarcerated from 07/2016 to 10/10/2016. He presents today with [name] from [place] [phone] mobile)    1. Feet and leg swelling: since 09/2016. Bilateral. Painful. He reports pain in metatarsals and burning sensation for mid shin down.  He has history of recurrent PE but no history of lower extremity venous thromboembolism. He takes gabapentin for chronic back pain. The dose was increased last week from 300 mg BID to 600 mg BID without improvement in pain. His vitamin B12 level was checked and is normal. He reports he sits often due to pain. He reports he does not elevate his legs.   He also request a letter to help him obtain housing. He currently lives with his fiance but would like to live alone stating they do not get along well.   Social History  Substance Use Topics  . Smoking status: Former Smoker    Packs/day: 0.00    Years: 30.00    Types: Cigarettes    Quit date: 09/29/2015  . Smokeless tobacco: Never Used  . Alcohol use No     Comment: Previously drank heavily on a daily basis.  Now drinks a few bottles of wine 1-2 x /wk (06/2014)    Outpatient Medications Prior to Visit  Medication Sig Dispense Refill  . albuterol (PROVENTIL) (2.5 MG/3ML) 0.083% nebulizer solution Take 3 mLs (2.5 mg total) by nebulization every 6 (six) hours as needed for wheezing or shortness of breath. 150 mL 1  . albuterol (VENTOLIN HFA) 108 (90 Base) MCG/ACT inhaler INHALE 2 PUFFS INTO THE LUNGS EVERY 6 HOURS AS NEEDED FOR WHEEZING OR SHORTNESS OF BREATH. 18 g 3  . amLODipine (NORVASC) 10 MG tablet Take 1 tablet (10 mg total) by mouth daily. 30 tablet 5  . apixaban (ELIQUIS) 5 MG TABS tablet Take 1 tablet (5 mg  total) by mouth 2 (two) times daily. 60 tablet 5  . aspirin EC 81 MG tablet Take 1 tablet (81 mg total) by mouth daily. 100 tablet 1  . atorvastatin (LIPITOR) 40 MG tablet Take 1 tablet (40 mg total) by mouth daily. 30 tablet 11  . budesonide-formoterol (SYMBICORT) 80-4.5 MCG/ACT inhaler Inhale 2 puffs into the lungs 2 (two) times daily. 1 Inhaler 3  . diclofenac sodium (VOLTAREN) 1 % GEL Apply 4 g topically 4 (four) times daily. (Patient not taking: Reported on 11/03/2016) 100 g 2  . DULoxetine (CYMBALTA) 30 MG capsule Take 1 capsule (30 mg total) by mouth daily. 30 capsule 3  . esomeprazole (NEXIUM) 20 MG capsule Take 1 capsule (20 mg total) by mouth 2 (two) times daily before a meal. 60 capsule 5  . gabapentin (NEURONTIN) 300 MG capsule Take 2 capsules (600 mg total) by mouth 2 (two) times daily. 120 capsule 6  . methocarbamol (ROBAXIN) 750 MG tablet Take 1 tablet (750 mg total) by mouth at bedtime as needed for muscle spasms. 30 tablet 2  . nitroGLYCERIN (NITROSTAT) 0.4 MG SL tablet Place 1 tablet (0.4 mg total) under the tongue every 5 (five) minutes as needed for chest pain. Daily max- 3 doses (Patient not taking: Reported on 05/22/2016) 50 tablet 1   Facility-Administered Medications Prior to Visit  Medication Dose Route  Frequency Provider Last Rate Last Dose  . albuterol (PROVENTIL) (2.5 MG/3ML) 0.083% nebulizer solution 2.5 mg  2.5 mg Nebulization Once Kaevion Sinclair, MD        ROS Review of Systems  Constitutional: Negative for chills, fatigue, fever and unexpected weight change.  Eyes: Negative for visual disturbance.  Respiratory: Negative for cough and shortness of breath.   Cardiovascular: Positive for chest pain. Negative for palpitations and leg swelling.  Gastrointestinal: Positive for abdominal pain and blood in stool. Negative for abdominal distention, anal bleeding, constipation, diarrhea, nausea and vomiting.  Endocrine: Negative for polydipsia, polyphagia and polyuria.    Musculoskeletal: Positive for arthralgias (dorsum of L foot ) and back pain. Negative for gait problem, myalgias and neck pain.  Skin: Negative for rash.  Allergic/Immunologic: Negative for immunocompromised state.  Hematological: Negative for adenopathy. Does not bruise/bleed easily.  Psychiatric/Behavioral: Positive for sleep disturbance. Negative for dysphoric mood and suicidal ideas. The patient is not nervous/anxious.     Objective:  BP 119/71   Pulse 69   Temp 97.9 F (36.6 C) (Oral)   Wt 168 lb 9.6 oz (76.5 kg)   SpO2 99%   BMI 24.90 kg/m   BP/Weight 11/06/2016 3/76/2831 09/10/7614  Systolic BP 073 710 626  Diastolic BP 71 88 78  Wt. (Lbs) 168.6 165.6 165.6  BMI 24.9 24.45 24.45    Physical Exam  Constitutional: He appears well-developed and well-nourished. No distress.  HENT:  Head: Normocephalic and atraumatic.  Neck: Normal range of motion. Neck supple.  Cardiovascular: Normal rate, regular rhythm and intact distal pulses.  Exam reveals gallop. Exam reveals no friction rub.   No murmur heard. Pulmonary/Chest: Effort normal and breath sounds normal.  Genitourinary: Rectum normal, prostate normal and penis normal. Rectal exam shows guaiac negative stool.  Musculoskeletal: He exhibits no edema.  Back Exam: Back: Normal Curvature, no deformities or CVA tenderness  Paraspinal Tenderness: b/l lumbar   LE Strength 4/5  LE Sensation: in tact  LE Reflexes 2+ and symmetric     Neurological: He is alert. Gait (walks with a cane) abnormal.  Skin: Skin is warm and dry. No rash noted. No erythema.  Psychiatric: He has a normal mood and affect.     Chemistry      Component Value Date/Time   NA 139 12/21/2014 1025   K 3.7 12/21/2014 1025   CL 107 12/21/2014 1025   CO2 25 12/21/2014 1025   BUN 6 12/21/2014 1025   CREATININE 1.02 12/21/2014 1025   CREATININE 1.06 07/05/2014 1125      Component Value Date/Time   CALCIUM 9.3 12/21/2014 1025   ALKPHOS 82 07/05/2014 1125    AST 43 (H) 07/05/2014 1125   ALT 37 07/05/2014 1125   BILITOT 0.5 07/05/2014 1125     Lab Results  Component Value Date   HGBA1C 5.40 06/06/2015   Lab Results  Component Value Date   HGBA1C 5.5 11/06/2016    Assessment & Plan:  Tank was seen today for foot swelling.  Diagnoses and all orders for this visit:  Leg swelling -     CMP14+EGFR -     CBC  Chronic midline low back pain with bilateral sciatica -     predniSONE (DELTASONE) 10 MG tablet; Take by mouth 4 tablets for 2 days, 3 tablets for 2 days, 2 tablets for 2 days, 1 tablet for 3 days then STOP -     POCT glycosylated hemoglobin (Hb A1C) -     Misc.  Devices (WALKER) MISC; 1 each by Does not apply route daily as needed.   There are no diagnoses linked to this encounter.  No orders of the defined types were placed in this encounter.   Follow-up: No Follow-up on file.   Boykin Nearing MD

## 2016-11-06 NOTE — Assessment & Plan Note (Signed)
Chronic low back pain now with pain and burning in feet resistant to gabapentin Vitamin B12 level normal Plan: Prednisone taper Compression sock for dependent edema Walker Rx provided CBC and BMP ordered A1c done today is normal

## 2016-11-06 NOTE — Patient Instructions (Addendum)
Jeremy Booker was seen today for foot swelling.  Diagnoses and all orders for this visit:  Leg swelling -     CMP14+EGFR -     CBC  Chronic midline low back pain with bilateral sciatica -     predniSONE (DELTASONE) 10 MG tablet; Take by mouth 4 tablets for 2 days, 3 tablets for 2 days, 2 tablets for 2 days, 1 tablet for 3 days then STOP -     POCT glycosylated hemoglobin (Hb A1C) -     Misc. Devices (WALKER) MISC; 1 each by Does not apply route daily as needed.   Keep 4 week follow up for recheck  Dr. Adrian Blackwater

## 2016-11-07 ENCOUNTER — Ambulatory Visit: Payer: Self-pay | Admitting: Family Medicine

## 2016-11-07 LAB — CBC
Hematocrit: 43.4 % (ref 37.5–51.0)
Hemoglobin: 14.6 g/dL (ref 13.0–17.7)
MCH: 30.7 pg (ref 26.6–33.0)
MCHC: 33.6 g/dL (ref 31.5–35.7)
MCV: 91 fL (ref 79–97)
PLATELETS: 221 10*3/uL (ref 150–379)
RBC: 4.76 x10E6/uL (ref 4.14–5.80)
RDW: 14.8 % (ref 12.3–15.4)
WBC: 5.3 10*3/uL (ref 3.4–10.8)

## 2016-11-07 LAB — CMP14+EGFR
A/G RATIO: 1.4 (ref 1.2–2.2)
ALBUMIN: 4.6 g/dL (ref 3.5–5.5)
ALT: 21 IU/L (ref 0–44)
AST: 22 IU/L (ref 0–40)
Alkaline Phosphatase: 95 IU/L (ref 39–117)
BUN / CREAT RATIO: 10 (ref 9–20)
BUN: 9 mg/dL (ref 6–24)
Bilirubin Total: 0.7 mg/dL (ref 0.0–1.2)
CALCIUM: 10.1 mg/dL (ref 8.7–10.2)
CO2: 22 mmol/L (ref 20–29)
CREATININE: 0.92 mg/dL (ref 0.76–1.27)
Chloride: 100 mmol/L (ref 96–106)
GFR, EST AFRICAN AMERICAN: 113 mL/min/{1.73_m2} (ref >59)
GFR, EST NON AFRICAN AMERICAN: 97 mL/min/{1.73_m2} (ref >59)
GLOBULIN, TOTAL: 3.3 g/dL (ref 1.5–4.5)
Glucose: 85 mg/dL (ref 65–99)
Potassium: 4.1 mmol/L (ref 3.5–5.2)
SODIUM: 141 mmol/L (ref 134–144)
TOTAL PROTEIN: 7.9 g/dL (ref 6.0–8.5)

## 2016-11-11 MED FILL — ?AMLODIPINE BESYLATE 10 MG: 10 | 30 days supply | Qty: 30 | Fill #1

## 2016-11-13 ENCOUNTER — Telehealth: Payer: Self-pay

## 2016-11-13 NOTE — Telephone Encounter (Signed)
Pt was called and informed of lab results. 

## 2016-11-17 ENCOUNTER — Telehealth: Payer: Self-pay | Admitting: Family Medicine

## 2016-11-17 NOTE — Telephone Encounter (Signed)
Pt calling to request an urgent call from PCP [facility] regarding a procedure that he has scheduled for tomorrow. States that he needs some further instruction regarding the procedure and has a few more questions about it.

## 2016-11-17 NOTE — Telephone Encounter (Signed)
Please call back to patient Is he referring to the coloscopy? He is advised to call the GI doctor's office.

## 2016-11-17 NOTE — Telephone Encounter (Signed)
Pt calling to advise Dr Adrian Blackwater that he took his blood thinners this morning and would like to know if he should still go get his procedure done tomorrow. Requests an urgent f/u call to advise. Thank you.

## 2016-11-17 NOTE — Telephone Encounter (Signed)
Called back to patient Verified name and DOB  He reports he took eliquis this morning He reports he only takes it in the morning  He has already called GI and rescheduled the EGD and colonoscopy   He is advised to take Eliquis 5 mg BID He reports stomach pain, early satiety and abdominal bloating. He is advised to go to ED if pain is severe

## 2016-11-24 ENCOUNTER — Telehealth: Payer: Self-pay | Admitting: Family Medicine

## 2016-11-24 NOTE — Telephone Encounter (Signed)
Pt. Called to request a refill for his heart and BP medication, he doesn't know the name of the med, that his taking, I informed him that we need the name of the med to sent a correct msg to the provider to refill his med. He claim that if the pcp or the nurse will know what kind of medication her need , please follow up

## 2016-11-25 ENCOUNTER — Other Ambulatory Visit: Payer: Self-pay

## 2016-11-25 NOTE — Telephone Encounter (Signed)
Pt was called and there was no Vm set up to leave a message. Pt has refill on all medication that he is inquiring about.

## 2016-12-03 ENCOUNTER — Other Ambulatory Visit: Payer: Self-pay | Admitting: Family Medicine

## 2016-12-03 DIAGNOSIS — J438 Other emphysema: Secondary | ICD-10-CM

## 2016-12-03 MED FILL — GABAPENTIN 300 MG CAPSULE: 300 | 30 days supply | Qty: 90 | Fill #1

## 2016-12-03 MED FILL — ATORVASTATIN 40 MG TABLET: 40 | 30 days supply | Qty: 30 | Fill #1

## 2016-12-03 MED FILL — ALBUTEROL 0.083% INHAL SOLN: (2.5 MG/3ML | 15 days supply | Qty: 180 | Fill #1

## 2016-12-03 MED FILL — AMLODIPINE BESYLATE 10 MG T: 10 | 30 days supply | Qty: 30 | Fill #2

## 2016-12-05 ENCOUNTER — Encounter: Payer: Self-pay | Admitting: Family Medicine

## 2016-12-05 ENCOUNTER — Ambulatory Visit: Payer: Self-pay | Admitting: Family Medicine

## 2016-12-05 ENCOUNTER — Ambulatory Visit: Payer: Self-pay | Attending: Family Medicine | Admitting: Family Medicine

## 2016-12-05 VITALS — BP 126/84 | HR 68 | Temp 97.9°F | Ht 69.0 in | Wt 174.0 lb

## 2016-12-05 DIAGNOSIS — Q249 Congenital malformation of heart, unspecified: Secondary | ICD-10-CM

## 2016-12-05 DIAGNOSIS — G8929 Other chronic pain: Secondary | ICD-10-CM | POA: Insufficient documentation

## 2016-12-05 DIAGNOSIS — R12 Heartburn: Secondary | ICD-10-CM | POA: Insufficient documentation

## 2016-12-05 DIAGNOSIS — Z9049 Acquired absence of other specified parts of digestive tract: Secondary | ICD-10-CM | POA: Insufficient documentation

## 2016-12-05 DIAGNOSIS — F329 Major depressive disorder, single episode, unspecified: Secondary | ICD-10-CM | POA: Insufficient documentation

## 2016-12-05 DIAGNOSIS — F1411 Cocaine abuse, in remission: Secondary | ICD-10-CM | POA: Insufficient documentation

## 2016-12-05 DIAGNOSIS — Z87898 Personal history of other specified conditions: Secondary | ICD-10-CM

## 2016-12-05 DIAGNOSIS — Z86711 Personal history of pulmonary embolism: Secondary | ICD-10-CM | POA: Insufficient documentation

## 2016-12-05 DIAGNOSIS — Z87891 Personal history of nicotine dependence: Secondary | ICD-10-CM | POA: Insufficient documentation

## 2016-12-05 DIAGNOSIS — Z7901 Long term (current) use of anticoagulants: Secondary | ICD-10-CM | POA: Insufficient documentation

## 2016-12-05 DIAGNOSIS — Z7982 Long term (current) use of aspirin: Secondary | ICD-10-CM | POA: Insufficient documentation

## 2016-12-05 DIAGNOSIS — M545 Low back pain, unspecified: Secondary | ICD-10-CM

## 2016-12-05 DIAGNOSIS — F1911 Other psychoactive substance abuse, in remission: Secondary | ICD-10-CM

## 2016-12-05 DIAGNOSIS — K2980 Duodenitis without bleeding: Secondary | ICD-10-CM | POA: Insufficient documentation

## 2016-12-05 DIAGNOSIS — G4730 Sleep apnea, unspecified: Secondary | ICD-10-CM | POA: Insufficient documentation

## 2016-12-05 DIAGNOSIS — M549 Dorsalgia, unspecified: Secondary | ICD-10-CM | POA: Insufficient documentation

## 2016-12-05 DIAGNOSIS — Z7902 Long term (current) use of antithrombotics/antiplatelets: Secondary | ICD-10-CM | POA: Insufficient documentation

## 2016-12-05 MED ORDER — DULOXETINE HCL 60 MG PO CPEP: 60.0000 mg | ORAL_CAPSULE | Freq: Every day | ORAL | 5 refills | Status: DC

## 2016-12-05 MED ORDER — ASPIRIN EC 81 MG PO TBEC: 81.0000 mg | DELAYED_RELEASE_TABLET | Freq: Every day | ORAL | 3 refills | Status: DC

## 2016-12-05 MED ORDER — TRAMADOL HCL 50 MG PO TABS: 50.0000 mg | ORAL_TABLET | Freq: Three times a day (TID) | ORAL | 0 refills | Status: DC | PRN
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
## 2016-12-05 NOTE — Patient Instructions (Signed)
Ma[place]k was seen today for back pain and depression.  Diagnoses and all orders for this visit:  Chronic midline low back pain without sciatica -     DULoxetine (CYMBALTA) 60 MG capsule; Take 1 capsule (60 mg total) by mouth daily. -     traMADol (ULTRAM) 50 MG tablet; Take 1 tablet (50 mg total) by mouth every 8 (eight) hours as needed.  Other orders -     aspirin EC 81 MG tablet; Take 1 tablet (81 mg total) by mouth daily.   F/u in 4 weeks to meet Dr. Wynetta Emery and discuss neurosurgery and pulmonology evaluation   Dr. Adrian Blackwater

## 2016-12-09 NOTE — Assessment & Plan Note (Signed)
Chronic low back pain now with pain and burning in feet resistant to gabapentin Vitamin B12 level normal Plan: Tramadol restarted for chronic pain Increase Cymbalta to 60 mg daily Patient has rescheduled with neurosurgery

## 2016-12-09 NOTE — Assessment & Plan Note (Signed)
Patient followed by cardiology Aspirin 81 mg daily ordered

## 2016-12-09 NOTE — Assessment & Plan Note (Signed)
In remission with 3 consecutive negative urine drug screens

## 2016-12-16 MED FILL — ?DULOXETINE HCL DR 60 MG CA: 60 MG | 30 days supply | Qty: 30 | Fill #0

## 2016-12-22 MED FILL — !ELIQUIS 5 MG TABLET: 5 | 30 days supply | Qty: 60 | Fill #1

## 2016-12-30 ENCOUNTER — Ambulatory Visit (INDEPENDENT_AMBULATORY_CARE_PROVIDER_SITE_OTHER): Payer: Self-pay | Admitting: Internal Medicine

## 2016-12-30 ENCOUNTER — Encounter: Payer: Self-pay | Admitting: Internal Medicine

## 2016-12-30 ENCOUNTER — Telehealth: Payer: Self-pay | Admitting: Internal Medicine

## 2016-12-30 VITALS — BP 130/76 | HR 67 | Ht 71.0 in | Wt 171.8 lb

## 2016-12-30 DIAGNOSIS — G4733 Obstructive sleep apnea (adult) (pediatric): Secondary | ICD-10-CM

## 2016-12-30 NOTE — Telephone Encounter (Signed)
Spoke with Barbaraann Rondo. He states that there is a form that we have to fill out to initiate this.  [facility] - can you please help with this?

## 2016-12-30 NOTE — Telephone Encounter (Signed)
Called and spoke to the patient about the cpap Assistance program told him he needs to sign this form he wants it mailed to him then he will send it back. Will put in the mail tomorrow.

## 2016-12-30 NOTE — Telephone Encounter (Signed)
Spoke with Barbaraann Rondo at [facility]. States that pt does not have insurance and can't afford the cost of a CPAP machine. They want to know if CY is willing to enroll the pt in the [place] program. The way Barbaraann Rondo described this program is the pt will pay a $100-$150 fee and that pays for the cost of the unit and his first month supplies. Barbaraann Rondo has not spoken to the pt about this yet, he wanted to make sure that CY was okay with enrolling the pt first.  [facility] - please advise. Thanks.

## 2016-12-30 NOTE — Patient Instructions (Signed)
Order- new DME ([facility]), new CPAP auto 5-20, mask of choice,humidifier, supplies AirView     Dx [facility] Patient may need any financial assistance available  Please call as needed

## 2016-12-30 NOTE — Progress Notes (Signed)
12/30/16-51 year old male former smoker referred courtesy of Dr Boykin Nearing; had sleep study at [facility] recently. Not on CPAP Medical problem list includes atrial fibrillation, bicuspid aortic valve, CAD, RVH, cocaine abuse, COPD, s/p R hemicolectomy, solitary pulmonary nodule 2016,  Previous evaluation 2016 by Dr. Melvyn Novas for dyspnea Epworth score 18/24 NPSG 03/24/16 [facility]( Dr Alcide Clever)- AHI 10.2/ hr He feels that he stops breathing in his sleep and is told that he snores loudly if he lies on his back. Denies daytime sleepiness ( Epworth 18) but admits frequent waking at night. Girlfriend [name] is here with him and says she has tried to do CPR on him and given him nebulizer treatments because his breathing pattern scares her at night. At least some leg jerks Sometimes takes an OTC sleep med. Goes to bed around 10 PM but often sleep latency 3 hours Little caffeine.  Prior to Admission medications   Medication Sig Start Date End Date Taking? Authorizing Provider  albuterol (PROVENTIL) (2.5 MG/3ML) 0.083% nebulizer solution TAKE 3 MLS BY NEBULIZATION EVERY 6 HOURS AS NEEDED FOR WHEEZING OR SHORTNESS OF BREATH. 12/03/16  Yes Funches, Josalyn, MD  albuterol (VENTOLIN HFA) 108 (90 Base) MCG/ACT inhaler INHALE 2 PUFFS INTO THE LUNGS EVERY 6 HOURS AS NEEDED FOR WHEEZING OR SHORTNESS OF BREATH. 10/14/16  Yes Funches, Josalyn, MD  amLODipine (NORVASC) 10 MG tablet Take 1 tablet (10 mg total) by mouth daily. 10/14/16  Yes Funches, Josalyn, MD  apixaban (ELIQUIS) 5 MG TABS tablet Take 1 tablet (5 mg total) by mouth 2 (two) times daily. 10/14/16  Yes Funches, Adriana Mccallum, MD  aspirin EC 81 MG tablet Take 1 tablet (81 mg total) by mouth daily. 12/05/16 12/05/17 Yes Funches, Josalyn, MD  atorvastatin (LIPITOR) 40 MG tablet Take 1 tablet (40 mg total) by mouth daily. 11/03/16 11/03/17 Yes Ladell Pier, MD  budesonide-formoterol (SYMBICORT) 80-4.5 MCG/ACT inhaler Inhale 2 puffs into  the lungs 2 (two) times daily. 10/14/16  Yes Funches, Josalyn, MD  diclofenac sodium (VOLTAREN) 1 % GEL Apply 4 g topically 4 (four) times daily. 10/14/16  Yes Funches, Josalyn, MD  DULoxetine (CYMBALTA) 60 MG capsule Take 1 capsule (60 mg total) by mouth daily. 12/05/16  Yes Funches, Adriana Mccallum, MD  esomeprazole (NEXIUM) 20 MG capsule Take 1 capsule (20 mg total) by mouth 2 (two) times daily before a meal. 10/27/16  Yes Funches, Josalyn, MD  gabapentin (NEURONTIN) 300 MG capsule Take 2 capsules (600 mg total) by mouth 2 (two) times daily. 11/03/16  Yes Ladell Pier, MD  methocarbamol (ROBAXIN) 750 MG tablet Take 1 tablet (750 mg total) by mouth at bedtime as needed for muscle spasms. 10/27/16  Yes Boykin Nearing, MD  Misc. Devices (WALKER) MISC 1 each by Does not apply route daily as needed. 11/06/16  Yes Funches, Josalyn, MD  traMADol (ULTRAM) 50 MG tablet Take 1 tablet (50 mg total) by mouth every 8 (eight) hours as needed. 12/05/16  Yes Funches, Josalyn, MD  nitroGLYCERIN (NITROSTAT) 0.4 MG SL tablet Place 1 tablet (0.4 mg total) under the tongue every 5 (five) minutes as needed for chest pain. Daily max- 3 doses Patient not taking: Reported on 12/30/2016 06/06/15   Arnoldo Morale, MD   Past Medical History:  Diagnosis Date  . Anxiety   . Atrial fibrillation ([place])    a. Dx 01/2016 in setting of PE, cocaine.  . Bicuspid aortic valve   . CAD (coronary artery disease)   . Chest pain    a.  2010 s/p cath in [place], [place] - reportedly nl.  . Chronic chest pain   . Cocaine abuse   . Congenital heart disease    a. thinks he had "a hole in my heart" s/p surgical correction @ age 33 and then age 41. Per notes, suspected prior aortic interruption and had repair.  Marland Kitchen COPD (chronic obstructive pulmonary disease) ([place])   . Depression   . Drug abuse    a. 06/2014 Cocaine/Marijuana - last used a few months ago.  Marland Kitchen ETOH abuse    a. 06/2014 currently a few bottles of wine/week.  . Hypertension       . Pulmonary  embolism ([place])   . RBBB   . 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 R lung 06/2014. b. multiple RLL PEs 01/2016.  . Tobacco abuse    a. 30 pack years. Quit   . Tubular adenoma    a. s/p right hemicolectomy 01/2016, neg path for malignancy, positive for ischemia.   Past Surgical History:  Procedure Laterality Date  . APPENDECTOMY    . CARDIAC SURGERY     congenital   . TONSILLECTOMY     Family History  Problem Relation Age of Onset  . Other Father        died in late 39's - ? cause  . Hypertension Mother        alive  . Clotting disorder Mother   . Hypertension Maternal Uncle   . Heart disease Maternal Uncle   . Heart disease Sister   . Cancer Brother        type unknown  . Cancer Maternal Uncle        x 3, type unknown  . Heart attack Neg Hx   . Stroke Neg Hx    Social History   Social History  . Marital status: Single    Spouse name: N/A  . Number of children: 3  . Years of education: N/A   Occupational History  . none    Social History Main Topics  . Smoking status: Former Smoker    Packs/day: 0.00    Years: 30.00    Types: Cigarettes    Quit date: 09/29/2015  . Smokeless tobacco: Never Used  . Alcohol use No     Comment: Previously drank heavily on a daily basis.  Now drinks a few bottles of wine 1-2 x /wk (06/2014)  . Drug use: No     Comment: last used Jan  2016  . Sexual activity: Not on file   Other Topics Concern  . Not on file   Social History Narrative   Was living in [place].  Now living with girlfriend in [place].  Unemployed and possibly pending disability.   ROS-see HPI  + = Positive Constitutional:    weight loss, night sweats, fevers, chills, +fatigue, lassitude. HEENT:    headaches, difficulty swallowing, tooth/dental problems, sore throat,       sneezing, itching, ear ache, nasal congestion, post nasal drip, snoring CV:    chest pain, orthopnea, PND, swelling in lower extremities, anasarca,                                                         dizziness, palpitations Resp:   shortness of breath with exertion or at rest.  productive cough,   non-productive cough, coughing up of blood.              change in color of mucus.  wheezing.   Skin:    rash or lesions. GI:  No-   heartburn, indigestion, abdominal pain, nausea, vomiting, diarrhea,                 change in bowel habits, loss of appetite GU: dysuria, change in color of urine, no urgency or frequency.   flank pain. MS:   joint pain, stiffness, decreased range of motion, +back pain. Neuro-     nothing unusual Psych:  change in mood or affect.  depression or anxiety.   memory loss.  OBJ- Physical Exam General- Alert, Oriented, Affect-appropriate, Distress- none acute, not obese Skin- rash-none, lesions- none, excoriation- none Lymphadenopathy- none Head- atraumatic            Eyes- Gross vision intact, PERRLA, conjunctivae and secretions clear            Ears- Hearing, canals-normal            Nose- Clear, no-Septal dev, mucus, polyps, erosion, perforation             Throat- Mallampati III , mucosa clear , drainage- none, tonsils- atrophic Neck- flexible , trachea midline, no stridor , thyroid nl, carotid no bruit Chest - symmetrical excursion , unlabored           Heart/CV- RRR , no murmur , no gallop  , no rub, nl s1 s2                           - JVD- none , edema- none, stasis changes- none, varices- none           Lung- clear to P&A, wheeze- none, cough- none , dullness-none, rub- none           Chest wall-  Abd-  Br/ Gen/ Rectal- Not done, not indicated Extrem- cyanosis- none, clubbing, none, atrophy- none, strength- nl Neuro- grossly intact to observation

## 2016-12-30 NOTE — Telephone Encounter (Signed)
That program would be fine with me. They need to explain it so he and [name], his lady friend, can understand it.

## 2017-01-01 ENCOUNTER — Telehealth: Payer: Self-pay

## 2017-01-01 NOTE — Telephone Encounter (Signed)
Pt came in the office today to reschedule his appointment that he has on January 05, 2017 @ 845am. Pt states he will not be able to make it because he has court. Would you be able to reschedule his appointment for a sooner date. His appointment will be a 30 mins to re-establish and f/u on chronic pain and depression

## 2017-01-05 ENCOUNTER — Ambulatory Visit: Payer: Self-pay | Admitting: Internal Medicine

## 2017-01-06 ENCOUNTER — Encounter: Payer: Self-pay | Admitting: Internal Medicine

## 2017-01-06 ENCOUNTER — Ambulatory Visit: Payer: Self-pay | Attending: Internal Medicine | Admitting: Internal Medicine

## 2017-01-06 VITALS — BP 122/76 | HR 61 | Temp 98.0°F | Resp 18 | Ht 71.0 in | Wt 174.4 lb

## 2017-01-06 DIAGNOSIS — J438 Other emphysema: Secondary | ICD-10-CM

## 2017-01-06 DIAGNOSIS — G8929 Other chronic pain: Secondary | ICD-10-CM | POA: Insufficient documentation

## 2017-01-06 DIAGNOSIS — G4733 Obstructive sleep apnea (adult) (pediatric): Secondary | ICD-10-CM | POA: Insufficient documentation

## 2017-01-06 DIAGNOSIS — Z2821 Immunization not carried out because of patient refusal: Secondary | ICD-10-CM

## 2017-01-06 DIAGNOSIS — M545 Low back pain, unspecified: Secondary | ICD-10-CM

## 2017-01-06 DIAGNOSIS — Z79899 Other long term (current) drug therapy: Secondary | ICD-10-CM | POA: Insufficient documentation

## 2017-01-06 DIAGNOSIS — I10 Essential (primary) hypertension: Secondary | ICD-10-CM | POA: Insufficient documentation

## 2017-01-06 DIAGNOSIS — E785 Hyperlipidemia, unspecified: Secondary | ICD-10-CM | POA: Insufficient documentation

## 2017-01-06 DIAGNOSIS — Z886 Allergy status to analgesic agent status: Secondary | ICD-10-CM | POA: Insufficient documentation

## 2017-01-06 DIAGNOSIS — Z7901 Long term (current) use of anticoagulants: Secondary | ICD-10-CM | POA: Insufficient documentation

## 2017-01-06 DIAGNOSIS — Z7982 Long term (current) use of aspirin: Secondary | ICD-10-CM | POA: Insufficient documentation

## 2017-01-06 DIAGNOSIS — Q231 Congenital insufficiency of aortic valve: Secondary | ICD-10-CM | POA: Insufficient documentation

## 2017-01-06 DIAGNOSIS — J449 Chronic obstructive pulmonary disease, unspecified: Secondary | ICD-10-CM | POA: Insufficient documentation

## 2017-01-06 DIAGNOSIS — Z87891 Personal history of nicotine dependence: Secondary | ICD-10-CM | POA: Insufficient documentation

## 2017-01-06 DIAGNOSIS — I4510 Unspecified right bundle-branch block: Secondary | ICD-10-CM | POA: Insufficient documentation

## 2017-01-06 DIAGNOSIS — I452 Bifascicular block: Secondary | ICD-10-CM | POA: Insufficient documentation

## 2017-01-06 MED ORDER — ALBUTEROL SULFATE HFA 108 (90 BASE) MCG/ACT IN AERS: INHALATION_SPRAY | RESPIRATORY_TRACT | 3 refills | Status: DC

## 2017-01-06 MED ORDER — DICLOFENAC SODIUM 1 % TD GEL: 4.0000 g | Freq: Four times a day (QID) | TRANSDERMAL | 2 refills | Status: DC

## 2017-01-06 MED ORDER — BUDESONIDE-FORMOTEROL FUMARATE 80-4.5 MCG/ACT IN AERO: 2.0000 | INHALATION_SPRAY | Freq: Two times a day (BID) | RESPIRATORY_TRACT | 3 refills | Status: DC

## 2017-01-06 MED ORDER — TRAMADOL HCL 50 MG PO TABS: 50.0000 mg | ORAL_TABLET | Freq: Three times a day (TID) | ORAL | 0 refills | Status: DC | PRN

## 2017-01-06 MED FILL — VOLTAREN 1% GEL: 1 | 6 days supply | Qty: 100 | Fill #0

## 2017-01-06 MED FILL — !VENTOLIN HFA INHALER: 108 (90 BAS | 25 days supply | Qty: 18 | Fill #0

## 2017-01-06 NOTE — Progress Notes (Signed)
Patient ID: Jeremy Booker, male    DOB: 08-Nov-1965  MRN: [id number]  CC: Follow-up   Subjective: Jeremy Booker is a 51 y.o. male who presents for f/u visit His concerns today include:  Jeremy Booker has hx of bicuspid aortic valve, recurrent PE on Eliquis 5 mg BID, cocaine use (in remission since 05/2016) with ischemic colitis s/p R hemicolectomy 01/2016, chronic LBP, HL, COPD, HTN Peer support, [name], from [place] is with him.  1.  Chronic LBP Referred to neurosurg at [facility] and was suppose to have appt 8/14. Needed to fill out form for charity care but has to wait until 01/10/2017 as [facility] will be changing from [place] [facility]  Has applied for disability and was called with appt to see disability doctor.  -feels back getting worse. On Gabapentin, Cymbalta and tramadol. Request higher dose of Tramadol -MRI lumbar spine 09/2014 Lumbar MRI 10/04/2014: IMPRESSION: Unchanged, mild lower lumbar disc degeneration most notable at L4-5 where there is moderate left neural foraminal stenosis due to a disc protrusion.  2. OSA:  +OSA Ordered CPAP already [facility] application completed and mailed off to get machine per pt  3. HTN Compliant with Norvasc  4. HL: compliant with Lipitor  5. COPD: Stable on current inhalers  Patient Active Problem List   Diagnosis Date Noted  . Obstructive sleep apnea 01/06/2017  . Heartburn 10/14/2016  . Cocaine abuse 05/26/2016  . Lower GI bleed 05/22/2016  . S/P right hemicolectomy 02/20/2016  . Atrial fibrillation ([place]) 02/18/2016  . Current smoker 06/18/2015  . Left anterior knee pain 05/04/2015  . RVH (right ventricular hypertrophy) 03/08/2015  . Chronic low back pain 01/26/2015  . Dyspnea 12/04/2014  . Solitary pulmonary nodule 07/20/2014  . Pulmonary embolus ([place]) 07/03/2014  . Atypical chest pain 07/02/2014  . 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 Drug abuse in  remission   . Hypertension   . COPD (chronic obstructive pulmonary disease) ([place])   . Congenital heart disease      Current Outpatient Prescriptions on File Prior to Visit  Medication Sig Dispense Refill  . albuterol (PROVENTIL) (2.5 MG/3ML) 0.083% nebulizer solution TAKE 3 MLS BY NEBULIZATION EVERY 6 HOURS AS NEEDED FOR WHEEZING OR SHORTNESS OF BREATH. 180 mL 0  . amLODipine (NORVASC) 10 MG tablet Take 1 tablet (10 mg total) by mouth daily. 30 tablet 5  . apixaban (ELIQUIS) 5 MG TABS tablet Take 1 tablet (5 mg total) by mouth 2 (two) times daily. 60 tablet 5  . aspirin EC 81 MG tablet Take 1 tablet (81 mg total) by mouth daily. 90 tablet 3  . atorvastatin (LIPITOR) 40 MG tablet Take 1 tablet (40 mg total) by mouth daily. 30 tablet 11  . DULoxetine (CYMBALTA) 60 MG capsule Take 1 capsule (60 mg total) by mouth daily. 30 capsule 5  . esomeprazole (NEXIUM) 20 MG capsule Take 1 capsule (20 mg total) by mouth 2 (two) times daily before a meal. 60 capsule 5  . gabapentin (NEURONTIN) 300 MG capsule Take 2 capsules (600 mg total) by mouth 2 (two) times daily. 120 capsule 6  . methocarbamol (ROBAXIN) 750 MG tablet Take 1 tablet (750 mg total) by mouth at bedtime as needed for muscle spasms. 30 tablet 2  . Misc. Devices (WALKER) MISC 1 each by Does not apply route daily as needed. 1 each 0  . nitroGLYCERIN (NITROSTAT) 0.4 MG SL tablet Place  1 tablet (0.4 mg total) under the tongue every 5 (five) minutes as needed for chest pain. Daily max- 3 doses (Patient not taking: Reported on 12/30/2016) 50 tablet 1   No current facility-administered medications on file prior to visit.     Allergies  Allergen Reactions  . Buprenorphine Hcl Shortness Of Breath  . Morphine And Related Shortness Of Breath  . Baclofen   . Dilaudid [Hydromorphone Hcl] Itching  . Morphine Palpitations  . Naprosyn [Naproxen] Itching and Rash    Patient has taken ibuprofen and tolerates that just fine Patient has taken ibuprofen  and tolerates that just fine    Social History   Social History  . Marital status: Single    Spouse name: N/A  . Number of children: 3  . Years of education: N/A   Occupational History  . none    Social History Main Topics  . Smoking status: Former Smoker    Packs/day: 0.00    Years: 30.00    Types: Cigarettes    Quit date: 09/29/2015  . Smokeless tobacco: Never Used  . Alcohol use No     Comment: Previously drank heavily on a daily basis.  Now drinks a few bottles of wine 1-2 x /wk (06/2014)  . Drug use: No     Comment: last used Jan  2016  . Sexual activity: Not on file   Other Topics Concern  . Not on file   Social History Narrative   Was living in [place].  Now living with girlfriend in [place].  Unemployed and possibly pending disability.    Family History  Problem Relation Age of Onset  . Other Father        died in late 13's - ? cause  . Hypertension Mother        alive  . Clotting disorder Mother   . Hypertension Maternal Uncle   . Heart disease Maternal Uncle   . Heart disease Sister   . Cancer Brother        type unknown  . Cancer Maternal Uncle        x 3, type unknown  . Heart attack Neg Hx   . Stroke Neg Hx     Past Surgical History:  Procedure Laterality Date  . APPENDECTOMY    . CARDIAC SURGERY     congenital   . TONSILLECTOMY      ROS: Review of Systems As stated above  PHYSICAL EXAM: BP 122/76 (BP Location: Right Arm, Patient Position: Sitting, Cuff Size: Normal)   Pulse 61   Temp 98 F (36.7 C)   Resp 18   Ht 5\' 11"  (1.803 m)   Wt 174 lb 6.4 oz (79.1 kg)   SpO2 98%   BMI 24.32 kg/m   Physical Exam General appearance - alert, well appearing, and in no distress Mental status - alert, oriented to person, place, and time, normal mood, behavior, speech, dress, motor activity, and thought processes Neck - supple, no significant adenopathy Chest - clear to auscultation, no wheezes, rales or rhonchi, symmetric air entry Heart -  normal rate, regular rhythm, normal S1, S2, no murmurs, rubs, clicks or gallops Musculoskeletal - ambulates without assistive device Extremities - no LE edema  ASSESSMENT AND PLAN: 1. Chronic midline low back pain without sciatica -pt plans to apply for charity with [facility] after 01/10/2017. Will schedule appt with pain specialist or neurosurg after. Cont tramadol at current dose. Pt advised he can also use Voltarin gel  on back According to Dr. Noni Saupe note, pt has had 3 neg UDS since early this yr.  Will need to have him sign controlled substance prescribing agreement on next visit - diclofenac sodium (VOLTAREN) 1 % GEL; Apply 4 g topically 4 (four) times daily.  Dispense: 100 g; Refill: 2 - traMADol (ULTRAM) 50 MG tablet; Take 1 tablet (50 mg total) by mouth every 8 (eight) hours as needed.  Dispense: 90 tablet; Refill: 0  2. OSA (obstructive sleep apnea) -await CPAP machine  3. Other emphysema (HCC) -stable - albuterol (VENTOLIN HFA) 108 (90 Base) MCG/ACT inhaler; INHALE 2 PUFFS INTO THE LUNGS EVERY 6 HOURS AS NEEDED FOR WHEEZING OR SHORTNESS OF BREATH.  Dispense: 18 g; Refill: 3 - budesonide-formoterol (SYMBICORT) 80-4.5 MCG/ACT inhaler; Inhale 2 puffs into the lungs 2 (two) times daily.  Dispense: 1 Inhaler; Refill: 3  4. Influenza vaccination declined   5. Essential hypertension At goal. Cont current meds and low salt intake   Patient was given the opportunity to ask questions.  Patient verbalized understanding of the plan and was able to repeat key elements of the plan.   No orders of the defined types were placed in this encounter.    Requested Prescriptions   Signed Prescriptions Disp Refills  . albuterol (VENTOLIN HFA) 108 (90 Base) MCG/ACT inhaler 18 g 3    Sig: INHALE 2 PUFFS INTO THE LUNGS EVERY 6 HOURS AS NEEDED FOR WHEEZING OR SHORTNESS OF BREATH.  . budesonide-formoterol (SYMBICORT) 80-4.5 MCG/ACT inhaler 1 Inhaler 3    Sig: Inhale 2 puffs into the lungs 2  (two) times daily.  . diclofenac sodium (VOLTAREN) 1 % GEL 100 g 2    Sig: Apply 4 g topically 4 (four) times daily.  . traMADol (ULTRAM) 50 MG tablet 90 tablet 0    Sig: Take 1 tablet (50 mg total) by mouth every 8 (eight) hours as needed.    Return in about 3 months (around 04/08/2017).  Karle Plumber, MD, FACP

## 2017-01-06 NOTE — Assessment & Plan Note (Signed)
Mild obstructive sleep apnea. Multiple comorbidities likely to impact sleep quality. Plan-educated on sleep hygiene, obstructive sleep apnea including medical concerns, responsibility to drive safely, treatment options. Plan-begin CPAP with auto Pap

## 2017-01-19 MED FILL — SYMBICORT 80-4.5 MCG INHALE: 80-4.5 | 25 days supply | Qty: 7 | Fill #0

## 2017-01-29 ENCOUNTER — Other Ambulatory Visit: Payer: Self-pay | Admitting: Family Medicine

## 2017-01-29 DIAGNOSIS — J438 Other emphysema: Secondary | ICD-10-CM

## 2017-01-29 MED FILL — METHOCARBAMOL 750 MG TABLET: 750 | 30 days supply | Qty: 30 | Fill #1

## 2017-01-29 MED FILL — GABAPENTIN 300 MG CAPSULE: 300 | 30 days supply | Qty: 90 | Fill #2

## 2017-01-29 MED FILL — VOLTAREN 1% GEL: 1 | 6 days supply | Qty: 100 | Fill #1

## 2017-01-29 MED FILL — AMLODIPINE BESYLATE 10 MG T: 10 | 30 days supply | Qty: 30 | Fill #3

## 2017-01-29 MED FILL — ?ATORVASTATIN 40MG TABLET: 40 | 30 days supply | Qty: 30 | Fill #2

## 2017-01-29 MED FILL — ESOMEPRAZOLE MAGNESIUM 40 M: 40 | 60 days supply | Qty: 60 | Fill #1

## 2017-01-29 MED FILL — !VENTOLIN HFA INHALER: 108 (90 BAS | 25 days supply | Qty: 18 | Fill #1

## 2017-01-29 MED FILL — ?ALBUTEROL SUL 2.5 MG/3 MLS: (2.5 MG/3ML | 30 days supply | Qty: 90 | Fill #0

## 2017-01-29 MED FILL — ?DULOXETINE HCL DR 60 MG CA: 60 MG | 30 days supply | Qty: 30 | Fill #1

## 2017-02-03 ENCOUNTER — Institutional Professional Consult (permissible substitution): Payer: Self-pay | Admitting: Pulmonary Disease

## 2017-02-13 ENCOUNTER — Ambulatory Visit: Payer: Self-pay | Admitting: Internal Medicine

## 2017-03-02 ENCOUNTER — Ambulatory Visit: Payer: Self-pay | Attending: Internal Medicine | Admitting: Internal Medicine

## 2017-03-02 ENCOUNTER — Encounter: Payer: Self-pay | Admitting: Internal Medicine

## 2017-03-02 VITALS — BP 109/78 | HR 56 | Temp 98.2°F | Resp 16 | Wt 177.6 lb

## 2017-03-02 DIAGNOSIS — Z7901 Long term (current) use of anticoagulants: Secondary | ICD-10-CM | POA: Insufficient documentation

## 2017-03-02 DIAGNOSIS — I10 Essential (primary) hypertension: Secondary | ICD-10-CM | POA: Insufficient documentation

## 2017-03-02 DIAGNOSIS — G4733 Obstructive sleep apnea (adult) (pediatric): Secondary | ICD-10-CM | POA: Insufficient documentation

## 2017-03-02 DIAGNOSIS — Z7982 Long term (current) use of aspirin: Secondary | ICD-10-CM | POA: Insufficient documentation

## 2017-03-02 DIAGNOSIS — Z87891 Personal history of nicotine dependence: Secondary | ICD-10-CM | POA: Insufficient documentation

## 2017-03-02 DIAGNOSIS — Z86711 Personal history of pulmonary embolism: Secondary | ICD-10-CM | POA: Insufficient documentation

## 2017-03-02 DIAGNOSIS — G8929 Other chronic pain: Secondary | ICD-10-CM | POA: Insufficient documentation

## 2017-03-02 DIAGNOSIS — Z87898 Personal history of other specified conditions: Secondary | ICD-10-CM

## 2017-03-02 DIAGNOSIS — I4891 Unspecified atrial fibrillation: Secondary | ICD-10-CM | POA: Insufficient documentation

## 2017-03-02 DIAGNOSIS — Q231 Congenital insufficiency of aortic valve: Secondary | ICD-10-CM | POA: Insufficient documentation

## 2017-03-02 DIAGNOSIS — J449 Chronic obstructive pulmonary disease, unspecified: Secondary | ICD-10-CM | POA: Insufficient documentation

## 2017-03-02 DIAGNOSIS — Z79899 Other long term (current) drug therapy: Secondary | ICD-10-CM | POA: Insufficient documentation

## 2017-03-02 DIAGNOSIS — Z886 Allergy status to analgesic agent status: Secondary | ICD-10-CM | POA: Insufficient documentation

## 2017-03-02 DIAGNOSIS — F1411 Cocaine abuse, in remission: Secondary | ICD-10-CM | POA: Insufficient documentation

## 2017-03-02 DIAGNOSIS — K625 Hemorrhage of anus and rectum: Secondary | ICD-10-CM | POA: Insufficient documentation

## 2017-03-02 DIAGNOSIS — F1911 Other psychoactive substance abuse, in remission: Secondary | ICD-10-CM

## 2017-03-02 DIAGNOSIS — M5441 Lumbago with sciatica, right side: Secondary | ICD-10-CM | POA: Insufficient documentation

## 2017-03-02 MED ORDER — TRAMADOL HCL 50 MG PO TABS: 50.0000 mg | ORAL_TABLET | Freq: Three times a day (TID) | ORAL | 1 refills | Status: DC | PRN

## 2017-03-02 NOTE — Patient Instructions (Signed)
Use heating pad to your back as need. I have referred you to neurosurgeon.
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
## 2017-03-10 ENCOUNTER — Telehealth: Payer: Self-pay | Admitting: Internal Medicine

## 2017-03-10 DIAGNOSIS — Q231 Congenital insufficiency of aortic valve: Secondary | ICD-10-CM

## 2017-03-10 DIAGNOSIS — I480 Paroxysmal atrial fibrillation: Secondary | ICD-10-CM

## 2017-03-10 DIAGNOSIS — Q2381 Bicuspid aortic valve: Secondary | ICD-10-CM

## 2017-03-10 NOTE — Telephone Encounter (Signed)
Patient called asking for a referral; to see the heart doctor and lung doctor because the people who he sees he owes to much money and they will not see him anymore. He see the lung doctor for cancer and the heart for HTM. Please

## 2017-03-11 NOTE — Addendum Note (Signed)
Addended by: Karle Plumber B on: 03/11/2017 12:17 PM   Modules accepted: Orders

## 2017-03-24 ENCOUNTER — Other Ambulatory Visit: Payer: Self-pay

## 2017-03-27 MED FILL — AMLODIPINE BESYLATE 10 MG T: 10 | 30 days supply | Qty: 30 | Fill #0

## 2017-03-27 MED FILL — !ELIQUIS 5 MG TABLET: 5 | 30 days supply | Qty: 60 | Fill #2

## 2017-03-27 MED FILL — METHOCARBAMOL 750 MG TABLET: 750 | 30 days supply | Qty: 30 | Fill #2

## 2017-03-27 MED FILL — ?ATORVASTATIN 40MG TABLET: 40 | 30 days supply | Qty: 30 | Fill #3

## 2017-03-27 MED FILL — GABAPENTIN 300 MG CAPSULE: 300 | 30 days supply | Qty: 90 | Fill #3

## 2017-03-27 MED FILL — ?ESOMEPRAZOLE MAG DR 40MG C: 40 | 30 days supply | Qty: 60 | Fill #2

## 2017-03-27 MED FILL — ?DULOXETINE HCL DR 60 MG CA: 60 MG | 30 days supply | Qty: 30 | Fill #2

## 2017-03-30 ENCOUNTER — Encounter: Payer: Self-pay | Admitting: Internal Medicine

## 2017-03-31 ENCOUNTER — Ambulatory Visit: Payer: Self-pay | Attending: Internal Medicine

## 2017-03-31 DIAGNOSIS — F1911 Other psychoactive substance abuse, in remission: Secondary | ICD-10-CM

## 2017-03-31 DIAGNOSIS — Z87898 Personal history of other specified conditions: Secondary | ICD-10-CM | POA: Insufficient documentation

## 2017-04-01 ENCOUNTER — Ambulatory Visit: Payer: Self-pay | Admitting: Internal Medicine

## 2017-04-01 LAB — COMPREHENSIVE METABOLIC PANEL
ALBUMIN: 4.4 g/dL (ref 3.5–5.5)
ALK PHOS: 79 IU/L (ref 39–117)
ALT: 24 IU/L (ref 0–44)
AST: 21 IU/L (ref 0–40)
Albumin/Globulin Ratio: 1.5 (ref 1.2–2.2)
BILIRUBIN TOTAL: 0.7 mg/dL (ref 0.0–1.2)
BUN / CREAT RATIO: 9 (ref 9–20)
BUN: 9 mg/dL (ref 6–24)
CHLORIDE: 102 mmol/L (ref 96–106)
CO2: 24 mmol/L (ref 20–29)
CREATININE: 0.98 mg/dL (ref 0.76–1.27)
Calcium: 9.7 mg/dL (ref 8.7–10.2)
GFR calc Af Amer: 104 mL/min/{1.73_m2} (ref >59)
GFR calc non Af Amer: 90 mL/min/{1.73_m2} (ref >59)
GLUCOSE: 75 mg/dL (ref 65–99)
Globulin, Total: 2.9 g/dL (ref 1.5–4.5)
Potassium: 4.3 mmol/L (ref 3.5–5.2)
Sodium: 140 mmol/L (ref 134–144)
Total Protein: 7.3 g/dL (ref 6.0–8.5)

## 2017-04-01 LAB — CBC
HEMATOCRIT: 39.6 % (ref 37.5–51.0)
HEMOGLOBIN: 13.4 g/dL (ref 13.0–17.7)
MCH: 30 pg (ref 26.6–33.0)
MCHC: 33.8 g/dL (ref 31.5–35.7)
MCV: 89 fL (ref 79–97)
Platelets: 239 10*3/uL (ref 150–379)
RBC: 4.46 x10E6/uL (ref 4.14–5.80)
RDW: 14.2 % (ref 12.3–15.4)
WBC: 4.3 10*3/uL (ref 3.4–10.8)

## 2017-04-01 LAB — HEPATITIS C ANTIBODY

## 2017-04-01 LAB — HIV ANTIBODY (ROUTINE TESTING W REFLEX): HIV SCREEN 4TH GENERATION: NONREACTIVE

## 2017-04-07 ENCOUNTER — Telehealth: Payer: Self-pay | Admitting: Internal Medicine

## 2017-04-07 NOTE — Telephone Encounter (Signed)
done

## 2017-04-07 NOTE — Telephone Encounter (Signed)
Patient came in asking for a print out of his lab results. Please fu

## 2017-04-10 ENCOUNTER — Ambulatory Visit: Payer: Self-pay | Admitting: Physician Assistant

## 2017-04-17 ENCOUNTER — Telehealth: Payer: Self-pay | Admitting: *Deleted

## 2017-04-17 NOTE — Telephone Encounter (Signed)
-----   Message from Ladell Pier, MD sent at 04/01/2017  2:15 PM EST ----- Let patient know that screening tests for HIV and hepatitis C were negative.  Blood count was normal.  Kidney and liver function tests normal.

## 2017-04-17 NOTE — Telephone Encounter (Signed)
MA unable to reach patient due to ringing and disconnecting. !!!Please inform patient of HIV and HEP C being negative. Patient also should be aware of blood count being normal as well as kidney and liver function!!!

## 2017-05-18 DIAGNOSIS — I10 Essential (primary) hypertension: Secondary | ICD-10-CM

## 2017-06-02 ENCOUNTER — Ambulatory Visit: Payer: Self-pay | Admitting: Internal Medicine

## 2017-06-04 MED FILL — DICLOFENAC SODIUM 1% GEL: 1 | 6 days supply | Qty: 100 | Fill #2

## 2017-06-04 MED FILL — ?DULOXETINE HCL DR 60 MG CA: 60 MG | 30 days supply | Qty: 30 | Fill #3

## 2017-06-04 MED FILL — !VENTOLIN HFA INHALER: 108 (90 BAS | 25 days supply | Qty: 18 | Fill #2

## 2017-06-04 MED FILL — AMLODIPINE BESYLATE 10 MG T: 10 | 30 days supply | Qty: 30 | Fill #1

## 2017-06-04 MED FILL — !ELIQUIS 5 MG TABLET: 5 | 30 days supply | Qty: 60 | Fill #3

## 2017-06-04 MED FILL — !SYMBICORT 80-4.5 MCG INH: 80-4.5 | 30 days supply | Qty: 1 | Fill #1

## 2017-06-04 MED FILL — ?ESOMEPRAZOLE MAG DR 40MG C: 40 | 30 days supply | Qty: 60 | Fill #3

## 2017-06-04 MED FILL — ?ALBUTEROL SUL 2.5 MG/3 MLS: (2.5 MG/3ML | 7 days supply | Qty: 90 | Fill #1

## 2017-06-04 MED FILL — ?ATORVASTATIN 40MG TABLET: 40 | 30 days supply | Qty: 30 | Fill #4

## 2017-06-15 ENCOUNTER — Ambulatory Visit: Payer: Self-pay | Attending: Internal Medicine | Admitting: Internal Medicine

## 2017-06-15 ENCOUNTER — Encounter: Payer: Self-pay | Admitting: Internal Medicine

## 2017-06-15 VITALS — BP 142/82 | HR 90 | Temp 97.8°F | Resp 16 | Ht 71.0 in | Wt 173.0 lb

## 2017-06-15 DIAGNOSIS — I4510 Unspecified right bundle-branch block: Secondary | ICD-10-CM | POA: Insufficient documentation

## 2017-06-15 DIAGNOSIS — F1410 Cocaine abuse, uncomplicated: Secondary | ICD-10-CM | POA: Insufficient documentation

## 2017-06-15 DIAGNOSIS — M7711 Lateral epicondylitis, right elbow: Secondary | ICD-10-CM | POA: Insufficient documentation

## 2017-06-15 DIAGNOSIS — Z79899 Other long term (current) drug therapy: Secondary | ICD-10-CM | POA: Insufficient documentation

## 2017-06-15 DIAGNOSIS — J438 Other emphysema: Secondary | ICD-10-CM | POA: Insufficient documentation

## 2017-06-15 DIAGNOSIS — Z5900 Homelessness unspecified: Secondary | ICD-10-CM

## 2017-06-15 DIAGNOSIS — I4891 Unspecified atrial fibrillation: Secondary | ICD-10-CM | POA: Insufficient documentation

## 2017-06-15 DIAGNOSIS — I10 Essential (primary) hypertension: Secondary | ICD-10-CM | POA: Insufficient documentation

## 2017-06-15 DIAGNOSIS — Z87891 Personal history of nicotine dependence: Secondary | ICD-10-CM | POA: Insufficient documentation

## 2017-06-15 DIAGNOSIS — G4733 Obstructive sleep apnea (adult) (pediatric): Secondary | ICD-10-CM | POA: Insufficient documentation

## 2017-06-15 DIAGNOSIS — Z7901 Long term (current) use of anticoagulants: Secondary | ICD-10-CM | POA: Insufficient documentation

## 2017-06-15 DIAGNOSIS — G8929 Other chronic pain: Secondary | ICD-10-CM | POA: Insufficient documentation

## 2017-06-15 DIAGNOSIS — Z7982 Long term (current) use of aspirin: Secondary | ICD-10-CM | POA: Insufficient documentation

## 2017-06-15 DIAGNOSIS — F1911 Other psychoactive substance abuse, in remission: Secondary | ICD-10-CM | POA: Insufficient documentation

## 2017-06-15 DIAGNOSIS — M545 Low back pain: Secondary | ICD-10-CM | POA: Insufficient documentation

## 2017-06-15 DIAGNOSIS — Z7951 Long term (current) use of inhaled steroids: Secondary | ICD-10-CM | POA: Insufficient documentation

## 2017-06-15 DIAGNOSIS — Q231 Congenital insufficiency of aortic valve: Secondary | ICD-10-CM | POA: Insufficient documentation

## 2017-06-15 DIAGNOSIS — Z885 Allergy status to narcotic agent status: Secondary | ICD-10-CM | POA: Insufficient documentation

## 2017-06-15 DIAGNOSIS — Z886 Allergy status to analgesic agent status: Secondary | ICD-10-CM | POA: Insufficient documentation

## 2017-06-15 MED ORDER — GABAPENTIN 300 MG PO CAPS
600.0000 mg | ORAL_CAPSULE | Freq: Two times a day (BID) | ORAL | 6 refills | Status: DC
Start: 2017-06-15 — End: 2017-11-23

## 2017-06-15 MED ORDER — METHOCARBAMOL 750 MG PO TABS: 750.0000 mg | ORAL_TABLET | Freq: Every evening | ORAL | 4 refills | Status: DC | PRN

## 2017-06-15 MED FILL — METHOCARBAMOL 750 MG TABS: 750 | 30 days supply | Qty: 30 | Fill #0

## 2017-06-15 NOTE — Patient Instructions (Addendum)
You should call and schedule appointment with physical therapy and the pain specialist through [facility] when you are ready.  Use the Voltaren gel on your right elbow as needed.  Use the Symbicort inhaler twice a day and the albuterol inhaler as needed.  You have been referred to pulmonary for follow-up.

## 2017-06-15 NOTE — Progress Notes (Signed)
Pt states his right elbow has been hurting real bad  Pt states the Cymbalta, gabapentin is not working  Pt states he has so much stress going on right now  Pt states he needs a referral to cardiology, GI, pulmonology, neurosurgeon, and orthopedic
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
## 2017-06-16 ENCOUNTER — Ambulatory Visit: Payer: Self-pay | Admitting: Internal Medicine

## 2017-07-23 MED FILL — !VENTOLIN HFA INHALER: 108 (90 BAS | 25 days supply | Qty: 18 | Fill #3

## 2017-07-23 MED FILL — ELIQUIS 5 MG TABLET: 5 | 30 days supply | Qty: 60 | Fill #4

## 2017-07-23 MED FILL — DULoxetine HCL 60 MG CPEP: 60 | 30 days supply | Qty: 30 | Fill #4

## 2017-07-23 MED FILL — SYMBICORT 80-4.5 MCG INH: 80-4.5 | 30 days supply | Qty: 10 | Fill #2

## 2017-07-23 MED FILL — METHOCARBAMOL 750 MG TABS: 750 | 30 days supply | Qty: 30 | Fill #1

## 2017-07-23 MED FILL — ESOMEPRAZOLE MAGNESIUM 40 M: 40 | 30 days supply | Qty: 60 | Fill #4

## 2017-07-23 MED FILL — DICLOFENAC SODIUM 1% GEL: 1 | 6 days supply | Qty: 100 | Fill #1

## 2017-07-23 MED FILL — GABAPENTIN 300 MG CAPSULE: 300 | 30 days supply | Qty: 120 | Fill #0

## 2017-07-23 MED FILL — AMLODIPINE BESYLATE 10 MG T: 10 | 30 days supply | Qty: 30 | Fill #2

## 2017-07-23 MED FILL — ALBUTEROL 0.083% INHAL SOLN: (2.5 MG/3ML | 7 days supply | Qty: 90 | Fill #2

## 2017-07-23 MED FILL — ATORVASTATIN 40 MG TABLET: 40 | 30 days supply | Qty: 30 | Fill #5

## 2017-07-27 ENCOUNTER — Other Ambulatory Visit: Payer: Self-pay | Admitting: Internal Medicine

## 2017-07-27 DIAGNOSIS — R12 Heartburn: Secondary | ICD-10-CM

## 2017-07-28 ENCOUNTER — Ambulatory Visit: Payer: Self-pay | Admitting: Internal Medicine

## 2017-07-29 MED ORDER — ESOMEPRAZOLE MAGNESIUM 20 MG PO CPDR: 20.0000 mg | DELAYED_RELEASE_CAPSULE | Freq: Two times a day (BID) | ORAL | 5 refills | Status: DC

## 2017-08-31 ENCOUNTER — Other Ambulatory Visit: Payer: Self-pay | Admitting: Family Medicine

## 2017-08-31 DIAGNOSIS — G8929 Other chronic pain: Secondary | ICD-10-CM

## 2017-08-31 DIAGNOSIS — M5441 Lumbago with sciatica, right side: Principal | ICD-10-CM

## 2017-09-08 ENCOUNTER — Ambulatory Visit: Payer: Self-pay | Admitting: Internal Medicine

## 2017-09-14 NOTE — Telephone Encounter (Signed)
Pt is aware.  

## 2017-09-14 NOTE — Telephone Encounter (Signed)
Tramadol RF request.  NCCSRS reviewed and is appropriate.

## 2017-09-15 ENCOUNTER — Other Ambulatory Visit: Payer: Self-pay | Admitting: Internal Medicine

## 2017-09-15 MED ORDER — MOMETASONE FURO-FORMOTEROL FUM 100-5 MCG/ACT IN AERO: 2.0000 | INHALATION_SPRAY | Freq: Two times a day (BID) | RESPIRATORY_TRACT | 3 refills | Status: DC

## 2017-10-06 MED FILL — $ELIQUIS 5 MG TABLET: 5 | 30 days supply | Qty: 60 | Fill #5

## 2017-10-06 MED FILL — AMLODIPINE BESYLATE 10 MG T: 10 | 30 days supply | Qty: 30 | Fill #3

## 2017-10-06 MED FILL — ESOMEPRAZOLE MAG DR 40 MG C: 40 | 30 days supply | Qty: 60 | Fill #5

## 2017-10-06 MED FILL — !DULERA 100 MCG/5 MCG INH: 100-5 | 30 days supply | Qty: 13 | Fill #0

## 2017-10-06 MED FILL — $VENTOLIN HFA 18G INHALER: 108 (90 BAS | 75 days supply | Qty: 54 | Fill #1

## 2017-10-06 MED FILL — ATORVASTATIN CALCIUM 40 MG: 40 | 30 days supply | Qty: 30 | Fill #6

## 2017-10-06 MED FILL — GABAPENTIN 300 MG CAPSULE: 300 | 30 days supply | Qty: 120 | Fill #1

## 2017-10-06 MED FILL — ALBUTEROL 0.083% INHAL SOLN: (2.5 MG/3ML | 7 days supply | Qty: 90 | Fill #3

## 2017-10-14 ENCOUNTER — Ambulatory Visit: Payer: Self-pay | Attending: Internal Medicine

## 2017-10-20 ENCOUNTER — Encounter: Payer: Self-pay | Admitting: Internal Medicine

## 2017-10-20 ENCOUNTER — Ambulatory Visit: Payer: Self-pay | Attending: Internal Medicine | Admitting: Internal Medicine

## 2017-10-20 VITALS — BP 129/79 | HR 79 | Temp 97.7°F | Resp 16 | Wt 166.6 lb

## 2017-10-20 DIAGNOSIS — G8929 Other chronic pain: Secondary | ICD-10-CM | POA: Insufficient documentation

## 2017-10-20 DIAGNOSIS — Z7901 Long term (current) use of anticoagulants: Secondary | ICD-10-CM | POA: Insufficient documentation

## 2017-10-20 DIAGNOSIS — F329 Major depressive disorder, single episode, unspecified: Secondary | ICD-10-CM | POA: Insufficient documentation

## 2017-10-20 DIAGNOSIS — Q231 Congenital insufficiency of aortic valve: Secondary | ICD-10-CM | POA: Insufficient documentation

## 2017-10-20 DIAGNOSIS — F1410 Cocaine abuse, uncomplicated: Secondary | ICD-10-CM | POA: Insufficient documentation

## 2017-10-20 DIAGNOSIS — Z86711 Personal history of pulmonary embolism: Secondary | ICD-10-CM | POA: Insufficient documentation

## 2017-10-20 DIAGNOSIS — Z885 Allergy status to narcotic agent status: Secondary | ICD-10-CM | POA: Insufficient documentation

## 2017-10-20 DIAGNOSIS — J449 Chronic obstructive pulmonary disease, unspecified: Secondary | ICD-10-CM | POA: Insufficient documentation

## 2017-10-20 DIAGNOSIS — I4891 Unspecified atrial fibrillation: Secondary | ICD-10-CM | POA: Insufficient documentation

## 2017-10-20 DIAGNOSIS — Z7982 Long term (current) use of aspirin: Secondary | ICD-10-CM | POA: Insufficient documentation

## 2017-10-20 DIAGNOSIS — Z9049 Acquired absence of other specified parts of digestive tract: Secondary | ICD-10-CM

## 2017-10-20 DIAGNOSIS — Z87891 Personal history of nicotine dependence: Secondary | ICD-10-CM | POA: Insufficient documentation

## 2017-10-20 DIAGNOSIS — Z886 Allergy status to analgesic agent status: Secondary | ICD-10-CM | POA: Insufficient documentation

## 2017-10-20 DIAGNOSIS — I10 Essential (primary) hypertension: Secondary | ICD-10-CM | POA: Insufficient documentation

## 2017-10-20 DIAGNOSIS — M545 Low back pain, unspecified: Secondary | ICD-10-CM

## 2017-10-20 DIAGNOSIS — G4733 Obstructive sleep apnea (adult) (pediatric): Secondary | ICD-10-CM | POA: Insufficient documentation

## 2017-10-20 DIAGNOSIS — Z79899 Other long term (current) drug therapy: Secondary | ICD-10-CM | POA: Insufficient documentation

## 2017-10-20 NOTE — Progress Notes (Signed)
Patient ID: Jeremy Booker, male    DOB: [birth year]  MRN: [id number]  CC: Follow-up   Subjective: Jeremy Booker is a 52 y.o. male who presents for chronic ds management.  Peer Support, [name], from [place] is with him. 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  Now living with a male friend.  COPD/hx recurrent PE: c/o problems breathing.  Feels he needs to be on oxygen continuously.  States he was told by ED physician to ask me about it.  Seen in ED [facility] 09/18/2017 with CP and SOB.  Found to have new RT sided P.E.  Pt apparently had skip taking the Elaquis.  He was restarted on same.  Seen again in ED  09/23/2017 with CP.  Work up was neg. CXR with out acute changes.  Dx with chest wall pain.  Given some Prednisone.  Reports compliance with ELaquis Using Neb QOD Using [facility] once a day.  Albuterol MDI 3 x a day Wants referral to pulmonary and cardiologist here in [place].  Does not have [facility] or [facility] discount card Wants referral for sleep titration study.  Had sleep study done in 2017 but never had titration part and therefore no CPAP  C/o a lot of back spasms and pain.  Wanting referral to ortho and pain specialist.  I reminded him that he saw ortho through [facility] in the fall and they referred to P.T and gave him contact info for their pain specialist.  He does not have the info on hand but states he will set up this appt himself.  He still has charity through [facility].  Request referral to GI. Still sees blood intermittently in stools.  History of ischemic colitis and has had hemicolectomy.  Last Saw GI 12/2016 through [facility] in [facility].  Had EGD and colonoscopy by them in 11/2016.  Had 2 HP polyps removed, + for grade 2 int hemorrhoid.  Told to  return for repeat c-scope in 5 yrs due to hx of adenomas.  CBC done ER 09/23/2017 revealed H/H 13.9/41 with nl PLT Patient Active Problem List   Diagnosis Date Noted  . Obstructive sleep apnea 01/06/2017  . Heartburn 10/14/2016  . Cocaine abuse ([place]) 05/26/2016  . Lower GI bleed 05/22/2016  . S/P right hemicolectomy 02/20/2016  . Atrial fibrillation ([place]) 02/18/2016  . Current smoker 06/18/2015  . Left anterior knee pain 05/04/2015  . RVH (right ventricular hypertrophy) 03/08/2015  . Chronic low back pain 01/26/2015  . Dyspnea 12/04/2014  . Solitary pulmonary nodule 07/20/2014  . Pulmonary embolus ([place]) 07/03/2014  . Atypical chest pain 07/02/2014  . 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 Drug abuse in remission   . Hypertension   . COPD (chronic obstructive pulmonary disease) ([place])   . Congenital heart disease      Current Outpatient Medications on File Prior to Visit  Medication Sig Dispense Refill  . albuterol (PROVENTIL) (2.5 MG/3ML) 0.083% nebulizer solution TAKE 3 MLS BY NEBULIZATION EVERY 6 HOURS AS NEEDED FOR WHEEZING OR SHORTNESS OF BREATH. 180 mL 1  . albuterol (VENTOLIN HFA) 108 (90 Base) MCG/ACT inhaler INHALE 2 PUFFS INTO THE LUNGS EVERY 6 HOURS AS NEEDED FOR WHEEZING OR SHORTNESS OF BREATH.  18 g 3  . amLODipine (NORVASC) 10 MG tablet Take 1 tablet (10 mg total) by mouth daily. 30 tablet 5  . apixaban (ELIQUIS) 5 MG TABS tablet Take 1 tablet (5 mg total) by mouth 2 (two) times daily. 60 tablet 5  . aspirin EC 81 MG tablet Take 1 tablet (81 mg total) by mouth daily. (Patient not taking: Reported on 06/15/2017) 90 tablet 3  . atorvastatin (LIPITOR) 40 MG tablet Take 1 tablet (40 mg total) by mouth daily. 30 tablet 11  . diclofenac sodium (VOLTAREN) 1 % GEL Apply 4 g topically 4 (four) times daily. 100 g 2  . DULoxetine (CYMBALTA) 60 MG capsule Take 1 capsule (60 mg total) by mouth daily. 30 capsule 5  . esomeprazole (NEXIUM) 20 MG  capsule Take 1 capsule (20 mg total) by mouth 2 (two) times daily before a meal. 60 capsule 5  . gabapentin (NEURONTIN) 300 MG capsule Take 2 capsules (600 mg total) by mouth 2 (two) times daily. 120 capsule 6  . methocarbamol (ROBAXIN) 750 MG tablet Take 1 tablet (750 mg total) by mouth at bedtime as needed for muscle spasms. 30 tablet 4  . Misc. Devices (WALKER) MISC 1 each by Does not apply route daily as needed. 1 each 0  . mometasone-formoterol (DULERA) 100-5 MCG/ACT AERO Inhale 2 puffs into the lungs 2 (two) times daily. 3 Inhaler 3  . nitroGLYCERIN (NITROSTAT) 0.4 MG SL tablet Place 1 tablet (0.4 mg total) under the tongue every 5 (five) minutes as needed for chest pain. Daily max- 3 doses (Patient not taking: Reported on 12/30/2016) 50 tablet 1  . traMADol (ULTRAM) 50 MG tablet TAKE 1 TABLET BY MOUTH EVERY 8 HOURS AS NEEDED 90 tablet 0   No current facility-administered medications on file prior to visit.     Allergies  Allergen Reactions  . Buprenorphine Hcl Shortness Of Breath  . Morphine And Related Shortness Of Breath  . Baclofen   . Dilaudid [Hydromorphone Hcl] Itching  . Morphine Palpitations  . Naprosyn [Naproxen] Itching and Rash    Patient has taken ibuprofen and tolerates that just fine Patient has taken ibuprofen and tolerates that just fine    Social History   Socioeconomic History  . Marital status: Single    Spouse name: Not on file  . Number of children: 3  . Years of education: Not on file  . Highest education level: Not on file  Occupational History  . Occupation: none  Social Needs  . Financial resource strain: Not on file  . Food insecurity:    Worry: Not on file    Inability: Not on file  . Transportation needs:    Medical: Not on file    Non-medical: Not on file  Tobacco Use  . Smoking status: Former Smoker    Packs/day: 0.00    Years: 30.00    Pack years: 0.00    Types: Cigarettes    Last attempt to quit: 09/29/2015    Years since quitting:  2.0  . Smokeless tobacco: Never Used  Substance and Sexual Activity  . Alcohol use: No    Alcohol/week: 0.0 oz    Comment: Previously drank heavily on a daily basis.  Now drinks a few bottles of wine 1-2 x /wk (06/2014)  . Drug use: No    Types: Cocaine, Marijuana    Comment: last used Jan  2016  . Sexual activity: Not on file  Lifestyle  . Physical activity:    Days  per week: Not on file    Minutes per session: Not on file  . Stress: Not on file  Relationships  . Social connections:    Talks on phone: Not on file    Gets together: Not on file    Attends religious service: Not on file    Active member of club or organization: Not on file    Attends meetings of clubs or organizations: Not on file    Relationship status: Not on file  . Intimate partner violence:    Fear of current or ex partner: Not on file    Emotionally abused: Not on file    Physically abused: Not on file    Forced sexual activity: Not on file  Other Topics Concern  . Not on file  Social History Narrative   Was living in [place].  Now living with girlfriend in [place].  Unemployed and possibly pending disability.    Family History  Problem Relation Age of Onset  . Other Father        died in late 32's - ? cause  . Hypertension Mother        alive  . Clotting disorder Mother   . Hypertension Maternal Uncle   . Heart disease Maternal Uncle   . Heart disease Sister   . Cancer Brother        type unknown  . Cancer Maternal Uncle        x 3, type unknown  . Heart attack Neg Hx   . Stroke Neg Hx     Past Surgical History:  Procedure Laterality Date  . APPENDECTOMY    . CARDIAC SURGERY     congenital   . TONSILLECTOMY      ROS: Review of Systems Neg except as above  PHYSICAL EXAM: BP 129/79   Pulse 79   Temp 97.7 F (36.5 C) (Oral)   Resp 16   Wt 166 lb 9.6 oz (75.6 kg)   SpO2 96%   BMI 23.24 kg/m    Physical Exam  General appearance - alert, well appearing, and in no  distress Mental status - flat affect.  Jumps around from one point to another when providing history Neck - supple, no significant adenopathy Chest - clear to auscultation, no wheezes, rales or rhonchi, symmetric air entry Heart - RRR.  No gallops Extremities - no LE edema MSK: pt jumps up twice very dramatically off exam table and holds his back while providing history. Reports spasm.  Has cane with him. Antalgic gait Pt ambulated in hallway for Pox check.  He stayed above 95 % per [place]: 1. Chronic obstructive pulmonary disease, unspecified COPD type ([place]) Advise to use Dulera BID as prescribed Pulmonanry referral through [place] pt that he does not met criteria for continuous O2.  However if he still wish to have it, he will have to purchase out of pocket as there are no charity programs in [place] that I am aware of that provides it for free  2. Bicuspid aortic valve Cardiology referral  3. Chronic midline low back pain without sciatica Pt will set up appt with pain specialist through [facility] but I place referral for him nonetheless  4. OSA (obstructive sleep apnea) Advise to appy for [facility] discount that will have to get new sleep test  5. Hx pulmonary embolism Advise compliance with Elaquis  6. History of hemicolectomy GI referral per his request.  Patient was given the opportunity to  ask questions.  Patient verbalized understanding of the plan and was able to repeat key elements of the plan.   No orders of the defined types were placed in this encounter.    Requested Prescriptions    No prescriptions requested or ordered in this encounter    Return in about 3 months (around 01/20/2018).  Karle Plumber, MD, FACP

## 2017-10-20 NOTE — Progress Notes (Signed)
Pt states he is having pain all over his body  Pt states his breathing is not getting any better  Pt states the hospital told him to talk to his pcp about getting oxygen  Pt is currently having muscle spasm in his back and it easies off when he stands up

## 2017-10-20 NOTE — Patient Instructions (Signed)
Use the Dulera twice a day aas prescribed.   I will resubmit referral to pulmonary, cardiology and new referral to gastroenterology.    Please call and schedule appointment with the pain specialist in [facility].  If you wish to see specialist here in [place], you must have the [employer] or [place] card.

## 2017-11-23 ENCOUNTER — Other Ambulatory Visit: Payer: Self-pay

## 2017-11-23 ENCOUNTER — Other Ambulatory Visit: Payer: Self-pay | Admitting: Internal Medicine

## 2017-11-23 DIAGNOSIS — I2699 Other pulmonary embolism without acute cor pulmonale: Secondary | ICD-10-CM

## 2017-11-23 DIAGNOSIS — I10 Essential (primary) hypertension: Secondary | ICD-10-CM

## 2017-11-23 DIAGNOSIS — M5441 Lumbago with sciatica, right side: Secondary | ICD-10-CM

## 2017-11-23 DIAGNOSIS — G8929 Other chronic pain: Secondary | ICD-10-CM

## 2017-11-23 DIAGNOSIS — J438 Other emphysema: Secondary | ICD-10-CM

## 2017-11-23 DIAGNOSIS — M545 Low back pain, unspecified: Secondary | ICD-10-CM

## 2017-11-23 DIAGNOSIS — Q249 Congenital malformation of heart, unspecified: Secondary | ICD-10-CM

## 2017-11-23 DIAGNOSIS — R0789 Other chest pain: Secondary | ICD-10-CM

## 2017-11-23 DIAGNOSIS — M5442 Lumbago with sciatica, left side: Secondary | ICD-10-CM

## 2017-11-23 DIAGNOSIS — R12 Heartburn: Secondary | ICD-10-CM

## 2017-11-25 ENCOUNTER — Other Ambulatory Visit: Payer: Self-pay

## 2017-11-25 ENCOUNTER — Encounter: Payer: Self-pay | Admitting: Primary Care

## 2017-11-25 ENCOUNTER — Ambulatory Visit (INDEPENDENT_AMBULATORY_CARE_PROVIDER_SITE_OTHER): Payer: Self-pay | Admitting: Primary Care

## 2017-11-25 DIAGNOSIS — I10 Essential (primary) hypertension: Secondary | ICD-10-CM

## 2017-11-25 DIAGNOSIS — J438 Other emphysema: Secondary | ICD-10-CM

## 2017-11-25 DIAGNOSIS — R0781 Pleurodynia: Secondary | ICD-10-CM

## 2017-11-25 DIAGNOSIS — G4733 Obstructive sleep apnea (adult) (pediatric): Secondary | ICD-10-CM

## 2017-11-25 DIAGNOSIS — I2699 Other pulmonary embolism without acute cor pulmonale: Secondary | ICD-10-CM

## 2017-11-25 MED ORDER — NITROGLYCERIN 0.4 MG SL SUBL: 0.4000 mg | SUBLINGUAL_TABLET | SUBLINGUAL | 1 refills | Status: AC | PRN

## 2017-11-25 MED ORDER — ALBUTEROL SULFATE (2.5 MG/3ML) 0.083% IN NEBU: INHALATION_SOLUTION | RESPIRATORY_TRACT | 1 refills | Status: DC

## 2017-11-25 MED ORDER — APIXABAN 5 MG PO TABS: 5.0000 mg | ORAL_TABLET | Freq: Two times a day (BID) | ORAL | 5 refills | Status: DC

## 2017-11-25 MED ORDER — METHOCARBAMOL 750 MG PO TABS: 750.0000 mg | ORAL_TABLET | Freq: Every evening | ORAL | 4 refills | Status: DC | PRN

## 2017-11-25 MED ORDER — WALKER MISC: 1.0000 | Freq: Every day | 0 refills | Status: DC | PRN

## 2017-11-25 MED ORDER — ALBUTEROL SULFATE HFA 108 (90 BASE) MCG/ACT IN AERS: INHALATION_SPRAY | RESPIRATORY_TRACT | 3 refills | Status: DC

## 2017-11-25 MED ORDER — GABAPENTIN 300 MG PO CAPS: 600.0000 mg | ORAL_CAPSULE | Freq: Two times a day (BID) | ORAL | 6 refills | Status: DC

## 2017-11-25 MED ORDER — DULOXETINE HCL 60 MG PO CPEP
60.0000 mg | ORAL_CAPSULE | Freq: Every day | ORAL | 5 refills | Status: DC
Start: 2017-11-25 — End: 2018-08-24

## 2017-11-25 MED ORDER — AMLODIPINE BESYLATE 10 MG PO TABS: 10.0000 mg | ORAL_TABLET | Freq: Every day | ORAL | 5 refills | Status: DC

## 2017-11-25 MED ORDER — MOMETASONE FURO-FORMOTEROL FUM 100-5 MCG/ACT IN AERO: 2.0000 | INHALATION_SPRAY | Freq: Two times a day (BID) | RESPIRATORY_TRACT | 3 refills | Status: DC

## 2017-11-25 MED ORDER — DICLOFENAC SODIUM 1 % TD GEL: 4.0000 g | Freq: Four times a day (QID) | TRANSDERMAL | 2 refills | Status: AC

## 2017-11-25 MED ORDER — ESOMEPRAZOLE MAGNESIUM 20 MG PO CPDR: 20.0000 mg | DELAYED_RELEASE_CAPSULE | Freq: Two times a day (BID) | ORAL | 5 refills | Status: DC

## 2017-11-25 MED ORDER — ATORVASTATIN CALCIUM 40 MG PO TABS: 40.0000 mg | ORAL_TABLET | Freq: Every day | ORAL | 11 refills | Status: DC

## 2017-11-25 MED ORDER — ASPIRIN EC 81 MG PO TBEC: 81.0000 mg | DELAYED_RELEASE_TABLET | Freq: Every day | ORAL | 3 refills | Status: DC

## 2017-11-25 NOTE — Assessment & Plan Note (Signed)
-   Continues Dulera twice daily and prn albuterol  - Amb 02 95% RA, does not qualify for home oxygen  - Could benefit from pulm rehab, consider

## 2017-11-25 NOTE — Patient Instructions (Addendum)
-  Needs DME for CPAP auto titrate 5-20cm H20 (will need patient assistance program) -Tylenol or Ibuprofen 2-3 times daily as needed for pleuritic chest pain (take with food, no alcohol) -Continue Eliquis for PE  -Take Dulera inhaler twice a day -FU in 3 months with Eustaquio Maize NP

## 2017-11-25 NOTE — Progress Notes (Signed)
@Patient  ID: Jeremy Booker, male    DOB: 12-Jun-1965, 52 y.o.   MRN: [id number]  Chief Complaint  Patient presents with  . Follow-up    Having more chest pain and SOB, Coughing but is unable to get mucus out.    Referring provider: Ladell Pier, MD  HPI: 52 year old male, former smoker (quit 2016). Hx HTN, COPD, OSA, GERD, bipolar disorder, schizophrenia, afib, PE X2, CAD, cocaine abuse. Patient of Dr. Annamaria Boots, last seen on 12/30/16; and Dr. Melvyn Novas, last seen 12/04/2014.   Recent ED visit in May for shortness of breath and pain with inspiration. Dx with PE and started on Eliquis. Received short prednisone course for chest wall pain.   11/25/2017  Presents today for follow-up visit, case worker/advocate is with patient. Continues to complain of intermittent left side chest pain. He is not currently experiencing pain, states that it comes and goes. Diagnosed with PE in May, currently taking Eliquis. Patient has prior history of a pulmonary embolism and was non-complaint with apixaban. Quit smoking in 2016, states that he picked up smoking again for a week or two and has since stopped. Hx cocaine abuse, states that he is not currently using but per ED report on 7/9 he had actively used that day. He states that he is compliant with his inhalers but not with him today. Sleep study in 2017 showing AHI 10.2. He has not started or received CPAP machine, states that he missed an apt. Ordered to start CPAP auto 5-20cm H20. Needs patient assistance program.    Allergies  Allergen Reactions  . Buprenorphine Hcl Shortness Of Breath  . Morphine And Related Shortness Of Breath  . Baclofen   . Dilaudid [Hydromorphone Hcl] Itching  . Morphine Palpitations  . Naprosyn [Naproxen] Itching and Rash    Patient has taken ibuprofen and tolerates that just fine Patient has taken ibuprofen and tolerates that just fine    There is no immunization history for the selected administration types on file for this  patient.  Past Medical History:  Diagnosis Date  . Anxiety   . Atrial fibrillation ([place])    a. Dx 01/2016 in setting of PE, cocaine.  . Bicuspid aortic valve   . CAD (coronary artery disease)   . Chest pain    a. 2010 s/p cath in [place], [place] - reportedly nl.  . Chronic chest pain   . Cocaine abuse ([place])   . Congenital heart disease    a. thinks he had "a hole in my heart" s/p surgical correction @ age 57 and then age 26. Per notes, suspected prior aortic interruption and had repair.  Marland Kitchen COPD (chronic obstructive pulmonary disease) ([place])   . Depression   . Drug abuse ([place])    a. 06/2014 Cocaine/Marijuana - last used a few months ago.  Marland Kitchen ETOH abuse    a. 06/2014 currently a few bottles of wine/week.  . Hypertension       . Pulmonary embolism ([place])   . RBBB   . Recurrent pulmonary emboli ([place])    a. R lung 06/2014. b. multiple RLL PEs 01/2016.  . Tobacco abuse    a. 30 pack years. Quit   . Tubular adenoma    a. s/p right hemicolectomy 01/2016, neg path for malignancy, positive for ischemia.    Tobacco History: Social History   Tobacco Use  Smoking Status Former Smoker  . Packs/day: 0.00  . Years: 30.00  . Pack years: 0.00  . Types:  Cigarettes  . Last attempt to quit: 09/29/2015  . Years since quitting: 2.1  Smokeless Tobacco Never Used   Counseling given: Not Answered   Outpatient Medications Prior to Visit  Medication Sig Dispense Refill  . atorvastatin (LIPITOR) 40 MG tablet TAKE 1 TABLET BY MOUTH DAILY. 30 tablet 11  . albuterol (PROVENTIL) (2.5 MG/3ML) 0.083% nebulizer solution TAKE 3 MLS BY NEBULIZATION EVERY 6 HOURS AS NEEDED FOR WHEEZING OR SHORTNESS OF BREATH. 180 mL 1  . albuterol (VENTOLIN HFA) 108 (90 Base) MCG/ACT inhaler INHALE 2 PUFFS INTO THE LUNGS EVERY 6 HOURS AS NEEDED FOR WHEEZING OR SHORTNESS OF BREATH. 18 g 3  . amLODipine (NORVASC) 10 MG tablet Take 1 tablet (10 mg total) by mouth daily. 30 tablet 5  . apixaban (ELIQUIS) 5 MG TABS tablet Take 1 tablet  (5 mg total) by mouth 2 (two) times daily. 60 tablet 5  . aspirin EC 81 MG tablet Take 1 tablet (81 mg total) by mouth daily. 90 tablet 3  . diclofenac sodium (VOLTAREN) 1 % GEL Apply 4 g topically 4 (four) times daily. 100 g 2  . DULoxetine (CYMBALTA) 60 MG capsule Take 1 capsule (60 mg total) by mouth daily. 30 capsule 5  . esomeprazole (NEXIUM) 20 MG capsule Take 1 capsule (20 mg total) by mouth 2 (two) times daily before a meal. 60 capsule 5  . gabapentin (NEURONTIN) 300 MG capsule Take 2 capsules (600 mg total) by mouth 2 (two) times daily. 120 capsule 6  . methocarbamol (ROBAXIN) 750 MG tablet Take 1 tablet (750 mg total) by mouth at bedtime as needed for muscle spasms. 30 tablet 4  . Misc. Devices (WALKER) MISC 1 each by Does not apply route daily as needed. 1 each 0  . mometasone-formoterol (DULERA) 100-5 MCG/ACT AERO Inhale 2 puffs into the lungs 2 (two) times daily. 3 Inhaler 3  . nitroGLYCERIN (NITROSTAT) 0.4 MG SL tablet Place 1 tablet (0.4 mg total) under the tongue every 5 (five) minutes as needed for chest pain. Daily max- 3 doses (Patient not taking: Reported on 11/25/2017) 50 tablet 1  . traMADol (ULTRAM) 50 MG tablet TAKE 1 TABLET BY MOUTH EVERY 8 HOURS AS NEEDED (Patient not taking: Reported on 11/25/2017) 90 tablet 0   No facility-administered medications prior to visit.       Review of Systems  Review of Systems  Constitutional: Negative.   HENT: Negative.   Respiratory: Positive for shortness of breath and wheezing. Negative for cough and chest tightness.   Cardiovascular: Negative for palpitations and leg swelling.  Gastrointestinal: Negative.   Allergic/Immunologic: Negative.      Physical Exam  BP 122/82   Pulse (!) 55   Ht 5\' 11"  (1.803 m)   Wt 159 lb (72.1 kg)   SpO2 99%   BMI 22.18 kg/m  Physical Exam  Constitutional: He is oriented to person, place, and time. He appears well-developed and well-nourished.  HENT:  Head: Normocephalic and atraumatic.    Eyes: Pupils are equal, round, and reactive to light. EOM are normal.  Neck: Normal range of motion. Neck supple.  Cardiovascular: Normal rate and regular rhythm.  Pulmonary/Chest: Effort normal. No tachypnea. No respiratory distress. He has wheezes.  Abdominal: Soft. Bowel sounds are normal. There is no tenderness.  Neurological: He is alert and oriented to person, place, and time.  Skin: Skin is warm and dry. No rash noted. No erythema.  Psychiatric: He has a normal mood and affect. His behavior is normal.  Judgment normal.     Lab Results:  CBC    Component Value Date/Time   WBC 4.3 03/31/2017 1035   WBC 6.0 05/22/2016 1432   RBC 4.46 03/31/2017 1035   RBC 4.49 05/22/2016 1432   HGB 13.4 03/31/2017 1035   HCT 39.6 03/31/2017 1035   PLT 239 03/31/2017 1035   MCV 89 03/31/2017 1035   MCH 30.0 03/31/2017 1035   MCH 30.3 05/22/2016 1432   MCHC 33.8 03/31/2017 1035   MCHC 33.3 05/22/2016 1432   RDW 14.2 03/31/2017 1035   LYMPHSABS 1.5 12/21/2014 1025   MONOABS 0.5 12/21/2014 1025   EOSABS 0.1 12/21/2014 1025   BASOSABS 0.0 12/21/2014 1025    BMET    Component Value Date/Time   NA 140 03/31/2017 1035   K 4.3 03/31/2017 1035   CL 102 03/31/2017 1035   CO2 24 03/31/2017 1035   GLUCOSE 75 03/31/2017 1035   GLUCOSE 100 (H) 12/21/2014 1025   BUN 9 03/31/2017 1035   CREATININE 0.98 03/31/2017 1035   CREATININE 1.06 07/05/2014 1125   CALCIUM 9.7 03/31/2017 1035   GFRNONAA 90 03/31/2017 1035   GFRNONAA 83 07/05/2014 1125   GFRAA 104 03/31/2017 1035   GFRAA >89 07/05/2014 1125    BNP    Component Value Date/Time   BNP 65.3 12/21/2014 1025    ProBNP No results found for: PROBNP  Imaging: No results found.   Assessment & Plan:   Pulmonary embolus (HCC) - Recurrent PE, most recently dx may 2019 - Past non-compliance with medication regimen - Reinforce importance of continuing Eliquis, currently taking   Hypertension - Stable; BP 122/82 - Continues Norvasc    COPD (chronic obstructive pulmonary disease) (HCC) - Continues Dulera twice daily and prn albuterol  - Amb 02 95% RA, does not qualify for home oxygen  - Could benefit from pulm rehab, consider    Obstructive sleep apnea - AHI 10.2 (03/2016) - Has not been set up with cpap, needs patient assistance - Plan DME with CPAP auto titrate 5-20cm H20 - FU in 3 months   Pleuritic chest pain - Chronic, appears to be ongoing for a few years - Patient was not experiencing chest pain in office today, states that its intermittent and worsens with deep breath - Rec tylenol or ibuprofen prn pain       Martyn Ehrich, NP 11/25/2017

## 2017-11-25 NOTE — Assessment & Plan Note (Addendum)
-   Stable; BP 122/82 - Continues Norvasc

## 2017-11-25 NOTE — Assessment & Plan Note (Signed)
-   Chronic, appears to be ongoing for a few years - Patient was not experiencing chest pain in office today, states that its intermittent and worsens with deep breath - Rec tylenol or ibuprofen prn pain

## 2017-11-25 NOTE — Assessment & Plan Note (Addendum)
-   Recurrent PE, most recently dx may 2019 - Past non-compliance with medication regimen - Reinforce importance of continuing Eliquis, currently taking

## 2017-11-25 NOTE — Progress Notes (Signed)
Chart and office note reviewed in detail  > agree with a/p as outlined    

## 2017-11-25 NOTE — Assessment & Plan Note (Signed)
-   AHI 10.2 (03/2016) - Has not been set up with cpap, needs patient assistance - Plan DME with CPAP auto titrate 5-20cm H20 - FU in 3 months

## 2017-11-30 ENCOUNTER — Telehealth: Payer: Self-pay | Admitting: Internal Medicine

## 2017-11-30 NOTE — Telephone Encounter (Signed)
I have written a prescription for him.

## 2017-11-30 NOTE — Telephone Encounter (Signed)
The patient came to the clinic today to inquire about a CPAP machine. He stated that he had a sleep study in 2017 and has not yet received a CPAP machine.  He was seen by pulmonary on 11/25/17 and he said he was instructed to obtain the machine. Pulmonary notes indicate to start  CPAP auto 5-20 cm H2O.  The patient does not have insurance and stated that he can't afford the $100 donation for the CPAP association refurbished machines.  Explained to him that the clinic can assist with $100 donation but it is a one  time donation/patient  and there is no guarantee that comes with the machine. Also informed him that it can take over a month to receive the machine after an order is placed and he said that he understood.   Informed him that his request would be shared with his provider.

## 2017-12-01 ENCOUNTER — Telehealth: Payer: Self-pay | Admitting: Primary Care

## 2017-12-01 ENCOUNTER — Telehealth: Payer: Self-pay

## 2017-12-01 DIAGNOSIS — G4733 Obstructive sleep apnea (adult) (pediatric): Secondary | ICD-10-CM

## 2017-12-01 NOTE — Telephone Encounter (Signed)
Call placed to [facility] to inquire about the mask size for the patient's auto CPAP. [place] is able to provide assistance with the $100 donation to help the patient obtain his machine through the [employer] - CPAP assistance program.  Spoke to [employer] who stated that she would send this request to the nurse.

## 2017-12-01 NOTE — Telephone Encounter (Signed)
Pt needs to have the specific mask size for his cpap order since he is getting this through the cpap assistance program.   CY please advise on size specification.  Thanks!

## 2017-12-01 NOTE — Telephone Encounter (Signed)
Katie- how do we deal with this. Doesn't [facility] help somehow?

## 2017-12-02 ENCOUNTER — Ambulatory Visit: Payer: Self-pay

## 2017-12-02 NOTE — Telephone Encounter (Signed)
Attempted to call patient today regarding CY recommendations. I did not receive an answer at time of call. I have left a voicemail message for pt to return call. X1

## 2017-12-02 NOTE — Telephone Encounter (Signed)
No sir. Pt will needs to be sent to Daytime sleep lab for mask fit and then we can order accordingly. Thanks.

## 2017-12-02 NOTE — Telephone Encounter (Signed)
Please order mask fitting 

## 2017-12-03 NOTE — Telephone Encounter (Signed)
Called and spoke with pt regarding new cpap mask Pt advised in need of cpap mask fitting at daytime sleep lab Pt advised that he has been in contact with Opal Sidles with [place] at [phone] regarding pt assistance program for cpap machine. He paid his $100.00 for the machine, needing a new mask fit. Order placed for new mask fit per CY recommendations Nothing further needed at this time.

## 2017-12-04 ENCOUNTER — Telehealth: Payer: Self-pay

## 2017-12-04 NOTE — Telephone Encounter (Signed)
Opened in error

## 2017-12-28 ENCOUNTER — Ambulatory Visit: Payer: Self-pay | Admitting: Internal Medicine

## 2018-01-04 ENCOUNTER — Other Ambulatory Visit (HOSPITAL_BASED_OUTPATIENT_CLINIC_OR_DEPARTMENT_OTHER): Payer: Self-pay

## 2018-01-05 ENCOUNTER — Telehealth: Payer: Self-pay

## 2018-01-05 NOTE — Telephone Encounter (Signed)
Attempted to contact the patient to inquire if he has gone for his mask fitting so that his CPAP machine can be ordered. Call placed to # [phone] (H) and a HIPAA compliant voicemail message was left requesting a call back to # [phone].

## 2018-01-21 ENCOUNTER — Ambulatory Visit: Payer: Self-pay | Admitting: Internal Medicine

## 2018-01-27 ENCOUNTER — Telehealth: Payer: Self-pay

## 2018-01-27 NOTE — Telephone Encounter (Signed)
Attempted to contact the patient to discuss need for mask fitting so that his CPAP machine can be ordered. Call placed to # [phone] (H) and a HIPAA compliant voicemail message was left requesting a call back to # [phone]

## 2018-02-25 ENCOUNTER — Ambulatory Visit: Payer: Self-pay | Admitting: Primary Care

## 2018-02-25 NOTE — Progress Notes (Deleted)
@Patient  ID: Hassan Buckler, male    DOB: 1965-11-21, 52 y.o.   MRN: [id number]  No chief complaint on file.   Referring provider: Ladell Pier, MD  HPI: 52 year old male, former smoker (quit 2016). Hx HTN, COPD, OSA, GERD, bipolar disorder, schizophrenia, afib, PE X2, CAD, cocaine abuse. Patient of Dr. Annamaria Boots, last seen on 12/30/16; and Dr. Melvyn Novas, last seen 12/04/2014.   Recent ED visit in May for shortness of breath and pain with inspiration. Dx with PE and started on Eliquis. Received short prednisone course for chest wall pain.   11/25/2017  Presents today for follow-up visit, case worker/advocate is with patient. Continues to complain of intermittent left side chest pain. He is not currently experiencing pain, states that it comes and goes. Diagnosed with PE in May, currently taking Eliquis. Patient has prior history of a pulmonary embolism and was non-complaint with apixaban. Quit smoking in 2016, states that he picked up smoking again for a week or two and has since stopped. Hx cocaine abuse, states that he is not currently using but per ED report on 7/9 he had actively used that day. He states that he is compliant with his inhalers but not with him today. Sleep study in 2017 showing AHI 10.2. He has not started or received CPAP machine, states that he missed an apt. Ordered to start CPAP auto 5-20cm H20. Needs patient assistance program.   02/25/2018 Patient presents today for OSA follow-up. Received CPAP machine, ordered for mask fitting. Getting supplies for assistance program.       Allergies  Allergen Reactions  . Buprenorphine Hcl Shortness Of Breath  . Morphine And Related Shortness Of Breath  . Baclofen   . Dilaudid [Hydromorphone Hcl] Itching  . Morphine Palpitations  . Naprosyn [Naproxen] Itching and Rash    Patient has taken ibuprofen and tolerates that just fine Patient has taken ibuprofen and tolerates that just fine    There is no immunization history for  the selected administration types on file for this patient.  Past Medical History:  Diagnosis Date  . Anxiety   . Atrial fibrillation ([place])    a. Dx 01/2016 in setting of PE, cocaine.  . Bicuspid aortic valve   . CAD (coronary artery disease)   . Chest pain    a. 2010 s/p cath in [place], [place] - reportedly nl.  . Chronic chest pain   . Cocaine abuse ([place])   . Congenital heart disease    a. thinks he had "a hole in my heart" s/p surgical correction @ age 80 and then age 89. Per notes, suspected prior aortic interruption and had repair.  Marland Kitchen COPD (chronic obstructive pulmonary disease) ([place])   . Depression   . Drug abuse ([place])    a. 06/2014 Cocaine/Marijuana - last used a few months ago.  Marland Kitchen ETOH abuse    a. 06/2014 currently a few bottles of wine/week.  . Hypertension       . Pulmonary embolism ([place])   . RBBB   . Recurrent pulmonary emboli ([place])    a. R lung 06/2014. b. multiple RLL PEs 01/2016.  . Tobacco abuse    a. 30 pack years. Quit   . Tubular adenoma    a. s/p right hemicolectomy 01/2016, neg path for malignancy, positive for ischemia.    Tobacco History: Social History   Tobacco Use  Smoking Status Former Smoker  . Packs/day: 0.00  . Years: 30.00  . Pack years: 0.00  .  Types: Cigarettes  . Last attempt to quit: 09/29/2015  . Years since quitting: 2.4  Smokeless Tobacco Never Used   Counseling given: Not Answered   Outpatient Medications Prior to Visit  Medication Sig Dispense Refill  . albuterol (PROVENTIL) (2.5 MG/3ML) 0.083% nebulizer solution TAKE 3 MLS BY NEBULIZATION EVERY 6 HOURS AS NEEDED FOR WHEEZING OR SHORTNESS OF BREATH. 180 mL 1  . albuterol (VENTOLIN HFA) 108 (90 Base) MCG/ACT inhaler INHALE 2 PUFFS INTO THE LUNGS EVERY 6 HOURS AS NEEDED FOR WHEEZING OR SHORTNESS OF BREATH. 18 g 3  . amLODipine (NORVASC) 10 MG tablet Take 1 tablet (10 mg total) by mouth daily. 30 tablet 5  . apixaban (ELIQUIS) 5 MG TABS tablet Take 1 tablet (5 mg total) by mouth 2 (two)  times daily. 60 tablet 5  . aspirin EC 81 MG tablet Take 1 tablet (81 mg total) by mouth daily. 90 tablet 3  . atorvastatin (LIPITOR) 40 MG tablet TAKE 1 TABLET BY MOUTH DAILY. 30 tablet 11  . atorvastatin (LIPITOR) 40 MG tablet Take 1 tablet (40 mg total) by mouth daily. 30 tablet 11  . diclofenac sodium (VOLTAREN) 1 % GEL Apply 4 g topically 4 (four) times daily. 100 g 2  . DULoxetine (CYMBALTA) 60 MG capsule Take 1 capsule (60 mg total) by mouth daily. 30 capsule 5  . esomeprazole (NEXIUM) 20 MG capsule Take 1 capsule (20 mg total) by mouth 2 (two) times daily before a meal. 60 capsule 5  . gabapentin (NEURONTIN) 300 MG capsule Take 2 capsules (600 mg total) by mouth 2 (two) times daily. 120 capsule 6  . methocarbamol (ROBAXIN) 750 MG tablet Take 1 tablet (750 mg total) by mouth at bedtime as needed for muscle spasms. 30 tablet 4  . Misc. Devices (WALKER) MISC 1 each by Does not apply route daily as needed. 1 each 0  . mometasone-formoterol (DULERA) 100-5 MCG/ACT AERO Inhale 2 puffs into the lungs 2 (two) times daily. 3 Inhaler 3  . nitroGLYCERIN (NITROSTAT) 0.4 MG SL tablet Place 1 tablet (0.4 mg total) under the tongue every 5 (five) minutes as needed for chest pain. Daily max- 3 doses 50 tablet 1   No facility-administered medications prior to visit.       Review of Systems  Review of Systems   Physical Exam  There were no vitals taken for this visit. Physical Exam   Lab Results:  CBC    Component Value Date/Time   WBC 4.3 03/31/2017 1035   WBC 6.0 05/22/2016 1432   RBC 4.46 03/31/2017 1035   RBC 4.49 05/22/2016 1432   HGB 13.4 03/31/2017 1035   HCT 39.6 03/31/2017 1035   PLT 239 03/31/2017 1035   MCV 89 03/31/2017 1035   MCH 30.0 03/31/2017 1035   MCH 30.3 05/22/2016 1432   MCHC 33.8 03/31/2017 1035   MCHC 33.3 05/22/2016 1432   RDW 14.2 03/31/2017 1035   LYMPHSABS 1.5 12/21/2014 1025   MONOABS 0.5 12/21/2014 1025   EOSABS 0.1 12/21/2014 1025   BASOSABS 0.0  12/21/2014 1025    BMET    Component Value Date/Time   NA 140 03/31/2017 1035   K 4.3 03/31/2017 1035   CL 102 03/31/2017 1035   CO2 24 03/31/2017 1035   GLUCOSE 75 03/31/2017 1035   GLUCOSE 100 (H) 12/21/2014 1025   BUN 9 03/31/2017 1035   CREATININE 0.98 03/31/2017 1035   CREATININE 1.06 07/05/2014 1125   CALCIUM 9.7 03/31/2017 1035   GFRNONAA  90 03/31/2017 1035   GFRNONAA 83 07/05/2014 1125   GFRAA 104 03/31/2017 1035   GFRAA >89 07/05/2014 1125    BNP    Component Value Date/Time   BNP 65.3 12/21/2014 1025    ProBNP No results found for: PROBNP  Imaging: No results found.   Assessment & Plan:   No problem-specific Assessment & Plan notes found for this encounter.     Martyn Ehrich, NP 02/25/2018

## 2018-03-31 DIAGNOSIS — I950 Idiopathic hypotension: Secondary | ICD-10-CM | POA: Insufficient documentation

## 2018-03-31 DIAGNOSIS — Z9989 Dependence on other enabling machines and devices: Secondary | ICD-10-CM

## 2018-03-31 DIAGNOSIS — G4733 Obstructive sleep apnea (adult) (pediatric): Secondary | ICD-10-CM | POA: Insufficient documentation

## 2018-06-15 ENCOUNTER — Other Ambulatory Visit: Payer: Self-pay | Admitting: Internal Medicine

## 2018-06-15 DIAGNOSIS — M545 Low back pain: Principal | ICD-10-CM

## 2018-06-15 DIAGNOSIS — G8929 Other chronic pain: Secondary | ICD-10-CM

## 2018-06-15 MED FILL — DULoxetine HCL 60 MG CPEP: 60 | 30 days supply | Qty: 30 | Fill #0

## 2018-06-15 MED FILL — ATORVASTATIN CALCIUM 40 MG: 40 | 30 days supply | Qty: 30 | Fill #0

## 2018-06-15 MED FILL — $VENTOLIN HFA 18G INHALER: 108 (90 BAS | 75 days supply | Qty: 54 | Fill #0

## 2018-06-15 MED FILL — NITROSTAT 0.4 MG TABLET SL: 0.4 | 20 days supply | Qty: 25 | Fill #0

## 2018-06-15 MED FILL — $ELIQUIS 5 MG TABLET: 5 | 90 days supply | Qty: 180 | Fill #0

## 2018-06-15 MED FILL — AMLODIPINE BESYLATE 10 MG T: 10 | 30 days supply | Qty: 30 | Fill #0

## 2018-06-15 MED FILL — ESOMEPRAZOLE MAGNESIUM 20 M: 20 | 30 days supply | Qty: 60 | Fill #0

## 2018-06-15 MED FILL — DICLOFENAC SODIUM 1% GEL: 1 | 6 days supply | Qty: 100 | Fill #0

## 2018-06-15 MED FILL — ALBUTEROL SUL 2.5 MG/3 ML S: (2.5 MG/3ML | 6 days supply | Qty: 75 | Fill #0

## 2018-06-22 ENCOUNTER — Ambulatory Visit: Payer: Self-pay | Admitting: Family Medicine

## 2018-07-19 ENCOUNTER — Ambulatory Visit: Payer: Self-pay | Admitting: Internal Medicine

## 2018-08-17 ENCOUNTER — Ambulatory Visit (HOSPITAL_BASED_OUTPATIENT_CLINIC_OR_DEPARTMENT_OTHER): Payer: Self-pay | Admitting: Internal Medicine

## 2018-08-17 ENCOUNTER — Other Ambulatory Visit: Payer: Self-pay

## 2018-08-17 DIAGNOSIS — Z5329 Procedure and treatment not carried out because of patient's decision for other reasons: Secondary | ICD-10-CM

## 2018-08-17 MED FILL — DICLOFENAC SODIUM 1% GEL: 1 | 6 days supply | Qty: 100 | Fill #1

## 2018-08-17 MED FILL — ATORVASTATIN CALCIUM 40 MG: 40 | 30 days supply | Qty: 30 | Fill #1

## 2018-08-17 MED FILL — GABAPENTIN 300 MG CAPSULE: 300 | 30 days supply | Qty: 120 | Fill #0

## 2018-08-17 MED FILL — NITROGLYCERIN 0.4 MG TAB SL: 0.4 | 20 days supply | Qty: 25 | Fill #1

## 2018-08-17 MED FILL — AMLODIPINE BESYLATE 10 MG T: 10 | 30 days supply | Qty: 30 | Fill #1

## 2018-08-17 MED FILL — ?ESOMEPRAZOLE MAGNES 20MG T: 20 | 30 days supply | Qty: 60 | Fill #1

## 2018-08-17 MED FILL — DULoxetine HCL 60 MG CPEP: 60 | 30 days supply | Qty: 30 | Fill #1

## 2018-08-21 NOTE — Telephone Encounter (Signed)
done

## 2018-08-24 ENCOUNTER — Ambulatory Visit: Payer: Self-pay | Attending: Internal Medicine | Admitting: Internal Medicine

## 2018-08-24 ENCOUNTER — Other Ambulatory Visit: Payer: Self-pay

## 2018-08-24 ENCOUNTER — Encounter: Payer: Self-pay | Admitting: Internal Medicine

## 2018-08-24 ENCOUNTER — Telehealth: Payer: Self-pay

## 2018-08-24 VITALS — BP 128/88 | HR 60 | Temp 97.9°F | Resp 20 | Ht 71.0 in | Wt 181.0 lb

## 2018-08-24 DIAGNOSIS — Z79899 Other long term (current) drug therapy: Secondary | ICD-10-CM

## 2018-08-24 DIAGNOSIS — G4733 Obstructive sleep apnea (adult) (pediatric): Secondary | ICD-10-CM

## 2018-08-24 DIAGNOSIS — J449 Chronic obstructive pulmonary disease, unspecified: Secondary | ICD-10-CM

## 2018-08-24 DIAGNOSIS — F1911 Other psychoactive substance abuse, in remission: Secondary | ICD-10-CM

## 2018-08-24 DIAGNOSIS — I10 Essential (primary) hypertension: Secondary | ICD-10-CM

## 2018-08-24 DIAGNOSIS — M5440 Lumbago with sciatica, unspecified side: Secondary | ICD-10-CM

## 2018-08-24 DIAGNOSIS — Q231 Congenital insufficiency of aortic valve: Secondary | ICD-10-CM

## 2018-08-24 DIAGNOSIS — G8929 Other chronic pain: Secondary | ICD-10-CM

## 2018-08-24 DIAGNOSIS — Z86711 Personal history of pulmonary embolism: Secondary | ICD-10-CM

## 2018-08-24 MED ORDER — ALBUTEROL SULFATE HFA 108 (90 BASE) MCG/ACT IN AERS: INHALATION_SPRAY | RESPIRATORY_TRACT | 3 refills | Status: DC

## 2018-08-24 MED ORDER — METHOCARBAMOL 750 MG PO TABS: 750.0000 mg | ORAL_TABLET | Freq: Every evening | ORAL | 4 refills | Status: DC | PRN

## 2018-08-24 MED ORDER — ALBUTEROL SULFATE (2.5 MG/3ML) 0.083% IN NEBU: INHALATION_SOLUTION | RESPIRATORY_TRACT | 1 refills | Status: DC

## 2018-08-24 MED ORDER — AMLODIPINE BESYLATE 10 MG PO TABS: 10.0000 mg | ORAL_TABLET | Freq: Every day | ORAL | 5 refills | Status: DC

## 2018-08-24 MED ORDER — ATORVASTATIN CALCIUM 40 MG PO TABS: 40.0000 mg | ORAL_TABLET | Freq: Every day | ORAL | 11 refills | Status: AC

## 2018-08-24 MED ORDER — DULOXETINE HCL 60 MG PO CPEP: 60.0000 mg | ORAL_CAPSULE | Freq: Every day | ORAL | 5 refills | Status: DC

## 2018-08-24 MED ORDER — APIXABAN 5 MG PO TABS: 5.0000 mg | ORAL_TABLET | Freq: Two times a day (BID) | ORAL | 5 refills | Status: DC

## 2018-08-24 MED ORDER — TRAMADOL HCL 50 MG PO TABS: 50.0000 mg | ORAL_TABLET | Freq: Two times a day (BID) | ORAL | 0 refills | Status: DC | PRN

## 2018-08-24 MED ORDER — WALKER MISC: 0 refills | Status: AC

## 2018-08-24 MED ORDER — MOMETASONE FURO-FORMOTEROL FUM 100-5 MCG/ACT IN AERO: 2.0000 | INHALATION_SPRAY | Freq: Two times a day (BID) | RESPIRATORY_TRACT | 3 refills | Status: DC

## 2018-08-24 MED FILL — $VENTOLIN HFA 18G INHALER: 108 (90 BAS | 25 days supply | Qty: 18 | Fill #0

## 2018-08-24 MED FILL — ALBUTEROL SUL 2.5 MG/3 ML S: (2.5 MG/3ML | 12 days supply | Qty: 150 | Fill #0

## 2018-08-24 MED FILL — traMADol HCL 50 MG TABS: 50 | 30 days supply | Qty: 60 | Fill #0

## 2018-08-24 MED FILL — METHOCARBAMOL 750 MG TABS: 750 | 30 days supply | Qty: 30 | Fill #0

## 2018-08-24 MED FILL — predniSONE 20 MG TABS: 20 | 5 days supply | Qty: 10 | Fill #0

## 2018-08-24 NOTE — Telephone Encounter (Signed)
Met with the patient when he was in the clinic today. Informed him that he can pick up a RW at [facility] tomorrow. It is a donation from the congregational nurse DME closet.   Also reminded him that an order was placed for him to have a mask desensitization.he also has an appointment to see Dr Joya Gaskins .  He explained that he has no insurance/income and is not able to afford a CPAP machine.   Informed him that [place] has a hardship program for individuals that are underinsured/ uninsured and not able to afford CPAP machines. He was agreeable to placing the referral with [facility] after his mask fitting.

## 2018-08-24 NOTE — Progress Notes (Signed)
States he has lost a lost of DME due to moving.  Needs script for walker and cane Needs nebulizer C-PAP  Referrals to GI, Pulmonology, Cardiologist

## 2018-08-24 NOTE — Progress Notes (Signed)
Patient ID: Jeremy Booker, male    DOB: [birth year]  MRN: [id number]  CC: Follow-up   Subjective: Jeremy Booker is a 53 y.o. 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  Pt was incarcerated 7 mths until 06/2018.  Has a disability hearing May 6th.    COPD/OSA:  Loss nebulizer machine during transition from being incarcerated.  Would like to get a new one if possible.  Does not smoke.  Needing refill on his inhalers.  Currently only has albuterol Albuterol which he is using  BID.  Out of [facility] He had seen pulmonary last year and they were working on getting CPAP machine.  He no showed for the mask fitting due to incarceration.  He wants to reinitiate the process so that he can be fitted for the mask and get the CPAP  Still complains of chronic back pain issues.  Was given prescription for walker in the past but he misplaced it after he was incarcerated.  Request new prescription for walker and cane.   Reportedly has an appointment with a back specialist this Friday at [facility].  Currently on gabapentin and Cymbalta.  Requesting to be placed back on tramadol and a muscle relaxant as pain is constant and limits his ability to function.  [place] a few times over the past several weeks.  No loss of bowel or bladder function. Last MRI LS spine done 03/2017 through [facility]: IMPRESSION: L4/L5 disc bulge with left foraminal component resulting in mild to moderate left foraminal stenosis. Mild central canal stenosis.   L3/L4 mild central canal mild bilateral foraminal stenosis secondary to disc bulge and mild posterior hypertrophic changes.   L5/S1 mild bilateral foraminal stenosis secondary to mild facet degenerative changes a small disc  bulge.  HTN: Reports compliance with Norvasc.  Tries to limit salt.    Had relapse with drug use last year but states that since he was released after being incarcerated for 7 months, he has been clean of all street drugs.  Currently staying at the shelter.  He has a Product/process development scientist working with him to get placement in more stable housing.  He states that he was told if he got a letter from his physician stating that due to his chronic medical issues it would be better for him to be in a stable housing situation this would help facilitate placement.    Seeing Dr. Vivia Ewing with [facility] in [facility] for his mental health issues.  Request referral to cardiology for follow-up on his history of bicuspid aortic valve. Patient Active Problem List   Diagnosis Date Noted   Pleuritic chest pain 11/25/2017   Obstructive sleep apnea 01/06/2017   Heartburn 10/14/2016   Cocaine abuse ([place]) 05/26/2016   Lower GI bleed 05/22/2016   S/P right hemicolectomy 02/20/2016   Atrial fibrillation ([place]) 02/18/2016   Current smoker 06/18/2015   Left anterior knee pain 05/04/2015   RVH (right ventricular hypertrophy) 03/08/2015   Chronic low back pain 01/26/2015   Dyspnea 12/04/2014   Solitary pulmonary nodule 07/20/2014   Pulmonary embolus ([place]) 07/03/2014   Atypical chest pain 07/02/2014   RBBB (right bundle branch block with left anterior fascicular block) 07/02/2014   Drug abuse in remission ([place])  Hypertension    COPD (chronic obstructive pulmonary disease) ([facility])    Congenital heart disease      Current Outpatient Medications on File Prior to Visit  Medication Sig Dispense Refill   diclofenac sodium (VOLTAREN) 1 % GEL Apply 4 g topically 4 (four) times daily. 100 g 2   esomeprazole (NEXIUM) 20 MG capsule Take 1 capsule (20 mg total) by mouth 2 (two) times daily before a meal. 60 capsule 5   gabapentin (NEURONTIN) 300 MG capsule TAKE 2 CAPSULES BY MOUTH 2 TIMES DAILY. 120 capsule 0    nitroGLYCERIN (NITROSTAT) 0.4 MG SL tablet Place 1 tablet (0.4 mg total) under the tongue every 5 (five) minutes as needed for chest pain. Daily max- 3 doses 50 tablet 1   No current facility-administered medications on file prior to visit.     Allergies  Allergen Reactions   Buprenorphine Hcl Shortness Of Breath   Morphine And Related Shortness Of Breath   Baclofen    Dilaudid [Hydromorphone Hcl] Itching   Morphine Palpitations   Naprosyn [Naproxen] Itching and Rash    Patient has taken ibuprofen and tolerates that just fine Patient has taken ibuprofen and tolerates that just fine    Social History   Socioeconomic History   Marital status: Single    Spouse name: Not on file   Number of children: 3   Years of education: Not on file   Highest education level: Not on file  Occupational History   Occupation: none  Social Designer, fashion/clothing strain: Not on file   Food insecurity:    Worry: Not on file    Inability: Not on file   Transportation needs:    Medical: Not on file    Non-medical: Not on file  Tobacco Use   Smoking status: Former Smoker    Packs/day: 0.00    Years: 30.00    Pack years: 0.00    Types: Cigarettes    Last attempt to quit: 09/29/2015    Years since quitting: 2.9   Smokeless tobacco: Never Used  Substance and Sexual Activity   Alcohol use: No    Alcohol/week: 0.0 standard drinks    Comment: Previously drank heavily on a daily basis.  Now drinks a few bottles of wine 1-2 x /wk (06/2014)   Drug use: No    Types: Cocaine, Marijuana    Comment: last used Jan  2016   Sexual activity: Not on file  Lifestyle   Physical activity:    Days per week: Not on file    Minutes per session: Not on file   Stress: Not on file  Relationships   Social connections:    Talks on phone: Not on file    Gets together: Not on file    Attends religious service: Not on file    Active member of club or organization: Not on file    Attends  meetings of clubs or organizations: Not on file    Relationship status: Not on file   Intimate partner violence:    Fear of current or ex partner: Not on file    Emotionally abused: Not on file    Physically abused: Not on file    Forced sexual activity: Not on file  Other Topics Concern   Not on file  Social History Narrative   Was living in [place].  Now living with girlfriend in [place].  Unemployed and possibly pending disability.    Family History  Problem Relation  Age of Onset   Other Father        died in late 35's - ? cause   Hypertension Mother        alive   Clotting disorder Mother    Hypertension Maternal Uncle    Heart disease Maternal Uncle    Heart disease Sister    Cancer Brother        type unknown   Cancer Maternal Uncle        x 3, type unknown   Heart attack Neg Hx    Stroke Neg Hx     Past Surgical History:  Procedure Laterality Date   APPENDECTOMY     CARDIAC SURGERY     congenital    TONSILLECTOMY      ROS: Review of Systems Negative except as stated above  PHYSICAL EXAM: BP 128/88    Pulse 60    Temp 97.9 F (36.6 C) (Oral)    Resp 20    Ht 5\' 11"  (1.803 m)    Wt 181 lb (82.1 kg)    SpO2 100%    BMI 25.24 kg/m   Wt Readings from Last 3 Encounters:  08/24/18 181 lb (82.1 kg)  11/25/17 159 lb (72.1 kg)  10/20/17 166 lb 9.6 oz (75.6 kg)   BP 128/88  Physical Exam General appearance - alert, well appearing, and in no distress Mental status - normal mood, behavior, speech, dress, motor activity, and thought processes Neck - supple, no significant adenopathy Chest -breath sounds slightly decreased at the bases but without wheezes and adequate air entry.  Heart -regular rate rhythm. Musculoskeletal -patient intermittently extends his trunk due to what he calls twinges of pain in his lower back.  Gait is slow.  Adequate foot to floor clearance. Extremities -no lower extremity edema  CMP Latest Ref Rng & Units 03/31/2017  11/06/2016 12/21/2014  Glucose 65 - 99 mg/dL 75 85 100(H)  BUN 6 - 24 mg/dL 9 9 6   Creatinine 0.76 - 1.27 mg/dL 0.98 0.92 1.02  Sodium 134 - 144 mmol/L 140 141 139  Potassium 3.5 - 5.2 mmol/L 4.3 4.1 3.7  Chloride 96 - 106 mmol/L 102 100 107  CO2 20 - 29 mmol/L 24 22 25   Calcium 8.7 - 10.2 mg/dL 9.7 10.1 9.3  Total Protein 6.0 - 8.5 g/dL 7.3 7.9 -  Total Bilirubin 0.0 - 1.2 mg/dL 0.7 0.7 -  Alkaline Phos 39 - 117 IU/L 79 95 -  AST 0 - 40 IU/L 21 22 -  ALT 0 - 44 IU/L 24 21 -   Lipid Panel     Component Value Date/Time   CHOL 192 07/03/2014 0130   TRIG 149 07/03/2014 0130   HDL 51 07/03/2014 0130   CHOLHDL 3.8 07/03/2014 0130   VLDL 30 07/03/2014 0130   LDLCALC 111 (H) 07/03/2014 0130    CBC    Component Value Date/Time   WBC 4.3 03/31/2017 1035   WBC 6.0 05/22/2016 1432   RBC 4.46 03/31/2017 1035   RBC 4.49 05/22/2016 1432   HGB 13.4 03/31/2017 1035   HCT 39.6 03/31/2017 1035   PLT 239 03/31/2017 1035   MCV 89 03/31/2017 1035   MCH 30.0 03/31/2017 1035   MCH 30.3 05/22/2016 1432   MCHC 33.8 03/31/2017 1035   MCHC 33.3 05/22/2016 1432   RDW 14.2 03/31/2017 1035   LYMPHSABS 1.5 12/21/2014 1025   MONOABS 0.5 12/21/2014 1025   EOSABS 0.1 12/21/2014 1025   BASOSABS 0.0 12/21/2014  [place] PLAN: 1. Chronic obstructive pulmonary disease, unspecified COPD type ([place]) Refill given on Dulera and albuterol. We do not have nebulizer machine to provide at this time. - mometasone-formoterol (DULERA) 100-5 MCG/ACT AERO; Inhale 2 puffs into the lungs 2 (two) times daily.  Dispense: 3 Inhaler; Refill: 3 - albuterol (PROVENTIL) (2.5 MG/3ML) 0.083% nebulizer solution; TAKE 3 MLS BY NEBULIZATION EVERY 6 HOURS AS NEEDED FOR WHEEZING OR SHORTNESS OF BREATH.  Dispense: 180 mL; Refill: 1 - albuterol (VENTOLIN HFA) 108 (90 Base) MCG/ACT inhaler; INHALE 2 PUFFS INTO THE LUNGS EVERY 6 HOURS AS NEEDED FOR WHEEZING OR SHORTNESS OF BREATH.  Dispense: 18 g; Refill: 3 - Ambulatory  referral to Pulmonology  2. OSA (obstructive sleep apnea) We will submit referral for him to have mask fitting after which our caseworker can work with an organization and getting him a CPAP machine - Ambulatory referral to Pulmonology - Desensitization mask fit; Future  3. Essential hypertension Not at goal.  Refill given on amlodipine - amLODipine (NORVASC) 10 MG tablet; Take 1 tablet (10 mg total) by mouth daily.  Dispense: 30 tablet; Refill: 5  4. Chronic midline low back pain with sciatica Patient to keep appointment with spine specialist later this week in [facility]. Refill Robaxin. Since he has reportedly been clean of street drugs for at least the last 9 months, we will go ahead and restart tramadol to use as needed.  Patient did not have any major side effects from the medication when he was on it in the past.  He knows that it is a controlled substance.  He is agreeable to having urine drug screen done today. [facility] reviewed Controlled substance prescribing agreement put in place - methocarbamol (ROBAXIN) 750 MG tablet; Take 1 tablet (750 mg total) by mouth at bedtime as needed for muscle spasms.  Dispense: 30 tablet; Refill: 4 - DULoxetine (CYMBALTA) 60 MG capsule; Take 1 capsule (60 mg total) by mouth daily.  Dispense: 30 capsule; Refill: 5 - Misc. Devices (WALKER) MISC; Use as indicated  Dispense: 1 each; Refill: 0 - traMADol (ULTRAM) 50 MG tablet; Take 1 tablet (50 mg total) by mouth 2 (two) times daily as needed.  Dispense: 60 tablet; Refill: 0  5. Controlled substance agreement signed - traMADol (ULTRAM) 50 MG tablet; Take 1 tablet (50 mg total) by mouth 2 (two) times daily as needed.  Dispense: 60 tablet; Refill: 0 - [id number] 11+Oxyco+Alc+Crt-Bund  6. Substance abuse in remission [facility]) Commended him on this.  Encouraged him to remain free of street drugs.  7. History of pulmonary embolism - apixaban (ELIQUIS) 5 MG TABS tablet; Take 1 tablet (5 mg total) by mouth 2 (two)  times daily.  Dispense: 60 tablet; Refill: 5  8. Bicuspid aortic valve - Ambulatory referral to Cardiology  9.  Homelessness Letter written in support of him getting more stable housing.  Patient was given the opportunity to ask questions.  Patient verbalized understanding of the plan and was able to repeat key elements of the plan.   Orders Placed This Encounter  Procedures   [id number] 11+Oxyco+Alc+Crt-Bund   Ambulatory referral to Pulmonology   Ambulatory referral to Cardiology   Desensitization mask fit     Requested Prescriptions   Signed Prescriptions Disp Refills   amLODipine (NORVASC) 10 MG tablet 30 tablet 5    Sig: Take 1 tablet (10 mg total) by mouth daily.   methocarbamol (ROBAXIN) 750 MG tablet 30 tablet 4    Sig:  Take 1 tablet (750 mg total) by mouth at bedtime as needed for muscle spasms.   DULoxetine (CYMBALTA) 60 MG capsule 30 capsule 5    Sig: Take 1 capsule (60 mg total) by mouth daily.   apixaban (ELIQUIS) 5 MG TABS tablet 60 tablet 5    Sig: Take 1 tablet (5 mg total) by mouth 2 (two) times daily.   mometasone-formoterol (DULERA) 100-5 MCG/ACT AERO 3 Inhaler 3    Sig: Inhale 2 puffs into the lungs 2 (two) times daily.   albuterol (PROVENTIL) (2.5 MG/3ML) 0.083% nebulizer solution 180 mL 1    Sig: TAKE 3 MLS BY NEBULIZATION EVERY 6 HOURS AS NEEDED FOR WHEEZING OR SHORTNESS OF BREATH.   albuterol (VENTOLIN HFA) 108 (90 Base) MCG/ACT inhaler 18 g 3    Sig: INHALE 2 PUFFS INTO THE LUNGS EVERY 6 HOURS AS NEEDED FOR WHEEZING OR SHORTNESS OF BREATH.   atorvastatin (LIPITOR) 40 MG tablet 30 tablet 11    Sig: Take 1 tablet (40 mg total) by mouth daily.   Misc. Devices (WALKER) MISC 1 each 0    Sig: Use as indicated   traMADol (ULTRAM) 50 MG tablet 60 tablet 0    Sig: Take 1 tablet (50 mg total) by mouth 2 (two) times daily as needed.    Return in about 3 months (around 11/23/2018).  Karle Plumber, MD, FACP

## 2018-08-28 NOTE — Progress Notes (Signed)
Opened in error.  Pt rescheduled this visit.

## 2018-09-12 NOTE — Progress Notes (Signed)
Subjective:    Patient ID: Jeremy Booker, male    DOB: 01-Sep-1965, 53 y.o.   MRN: [id number] Virtual Visit via Telephone Note  I connected with Hassan Buckler on 09/13/18 at 10:00 AM EDT by telephone and verified that I am speaking with the correct person using two identifiers.   Consent:  I discussed the limitations, risks, security and privacy concerns of performing an evaluation and management service by telephone and the availability of in person appointments. I also discussed with the patient that there may be a patient responsible charge related to this service. The patient expressed understanding and agreed to proceed.  Location of patient: Pt at home  Location of provider:  In my office   Persons participating in the televisit with the patient.   No one else on the phone    History of Present Illness:  53 y.o.M referred for COPD eval from PCPfrom visit 08/24/18 Pt has been in ED 4 x since 05/2018 This is a telephone visit.    This patient has severe chronic obstructive lung disease and has had recent hospitalizations for acute respiratory failure on a hypoxic basis.  The patient no longer is smoking at this time.  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  Patient has a pre-existing history of sleep apnea and was in the process of obtaining a CPAP machine when he became incarcerated.  He has had a recent sleep study performed and was in the process of getting mask desensitization when he became incarcerated.  He also had a nebulizer machine which he no longer has access.  The patient states his dyspnea is getting much worse over the past several weeks.  He is using his handheld inhaler on a daily basis.  He also was due to receive a walker and cane and has not been able to pick this up yet as well.  Patient has a previous history of alcohol use and cocaine use he states he is no longer using either of these substances.  Note he has had a recent  colonoscopy in November 2019 which is a follow-up on previous colon cancer with a cecal mass and right hemicolectomy.  He also has had a history of pulmonary embolus and is on chronic anticoagulation for same.  Patient also has chronic atrial fibrillation and hypertension.  Please review shortness of breath assessment below  Shortness of Breath  This is a chronic problem. The current episode started more than 1 year ago. The problem occurs constantly. The problem has been gradually worsening. Associated symptoms include chest pain, leg swelling, orthopnea, PND, sputum production and wheezing. Pertinent negatives include no fever or hemoptysis. The symptoms are aggravated by lying flat, any activity and exercise. Associated symptoms comments: Mucus . Risk factors include smoking. He has tried beta agonist inhalers, oral steroids and steroid inhalers for the symptoms. The treatment provided moderate relief. His past medical history is significant for COPD and PE.      Review of Systems  Constitutional: Negative for fever.  Respiratory: Positive for sputum production, shortness of breath and wheezing. Negative for hemoptysis.   Cardiovascular: Positive for chest pain, orthopnea, leg swelling and PND.   Observations/Objective: 08/21/18 CXR: IMPRESSION: Chronic lung changes without evidence of acute cardiopulmonary disease.  Surgical changes of median sternotomy  2016 CT Angio Chest: IMPRESSION: 1. No evidence of acute pulmonary emboli. 2. Interval partial recanalization of the right lower lobe pulmonary artery and the multiple segmental branches  which were involved by the prior emboli. Chronic filling defects persist, however. 3. COPD/emphysema. Scattered areas of hyperlucency in both lungs consistent with localized air trapping mass can be seen in patients with asthma. No acute cardiopulmonary disease otherwise. 4. Marked right ventricular enlargement and right ventricular hypertrophy,  moderate to marked left ventricular hypertrophy, marked right atrial enlargement, and marked dilation of the pulmonic valve leaflets, unchanged since the prior examinations.  Assessment and Plan: #1 chronic obstructive lung disease with chronic bronchitic and emphysematous components.  Currently the patient is not on supplemental oxygen.  Patient has associated sleep apnea and has yet to obtain a CPAP device  #2 obstructive sleep apnea again has not yet obtained a CPAP device  Plan  It became immediately clear if this patient needed an in office visit therefore 1 will be scheduled on 09/14/2018 No changes in the patient's medication plan will be made at this time The patient knows to come in on 09/14/2018 Follow Up Instructions: The patient understands to come in on 09/14/2018   I discussed the assessment and treatment plan with the patient. The patient was provided an opportunity to ask questions and all were answered. The patient agreed with the plan and demonstrated an understanding of the instructions.   The patient was advised to call back or seek an in-person evaluation if the symptoms worsen or if the condition fails to improve as anticipated.  I provided 15 minutes of non-face-to-face time during this encounter  including  median intraservice time , review of notes, labs, imaging, medications  and explaining diagnosis and management to the patient .    Asencion Noble, MD

## 2018-09-13 ENCOUNTER — Ambulatory Visit: Payer: Self-pay | Attending: Critical Care Medicine | Admitting: Critical Care Medicine

## 2018-09-13 ENCOUNTER — Other Ambulatory Visit: Payer: Self-pay | Admitting: Internal Medicine

## 2018-09-13 ENCOUNTER — Other Ambulatory Visit: Payer: Self-pay

## 2018-09-13 ENCOUNTER — Encounter: Payer: Self-pay | Admitting: Critical Care Medicine

## 2018-09-13 DIAGNOSIS — Z9989 Dependence on other enabling machines and devices: Secondary | ICD-10-CM

## 2018-09-13 DIAGNOSIS — M5440 Lumbago with sciatica, unspecified side: Principal | ICD-10-CM

## 2018-09-13 DIAGNOSIS — Z79899 Other long term (current) drug therapy: Secondary | ICD-10-CM

## 2018-09-13 DIAGNOSIS — F17200 Nicotine dependence, unspecified, uncomplicated: Secondary | ICD-10-CM

## 2018-09-13 DIAGNOSIS — G8929 Other chronic pain: Secondary | ICD-10-CM

## 2018-09-13 DIAGNOSIS — F1910 Other psychoactive substance abuse, uncomplicated: Secondary | ICD-10-CM

## 2018-09-13 DIAGNOSIS — I10 Essential (primary) hypertension: Secondary | ICD-10-CM

## 2018-09-13 DIAGNOSIS — J441 Chronic obstructive pulmonary disease with (acute) exacerbation: Secondary | ICD-10-CM

## 2018-09-13 DIAGNOSIS — G4733 Obstructive sleep apnea (adult) (pediatric): Secondary | ICD-10-CM

## 2018-09-13 DIAGNOSIS — F1410 Cocaine abuse, uncomplicated: Secondary | ICD-10-CM

## 2018-09-13 MED FILL — AMLODIPINE BESYLATE 10 MG T: 10 | 30 days supply | Qty: 30 | Fill #0

## 2018-09-13 MED FILL — DULoxetine HCL 60 MG CPEP: 60 | 30 days supply | Qty: 30 | Fill #2

## 2018-09-13 MED FILL — $ELIQUIS 5 MG TABLET: 5 | 30 days supply | Qty: 60 | Fill #0

## 2018-09-13 MED FILL — ATORVASTATIN CALCIUM 40 MG: 40 | 30 days supply | Qty: 30 | Fill #0

## 2018-09-13 MED FILL — $VENTOLIN HFA 18G INHALER: 108 (90 BAS | 25 days supply | Qty: 18 | Fill #1

## 2018-09-13 MED FILL — METHOCARBAMOL 750 MG TABS: 750 | 30 days supply | Qty: 30 | Fill #1

## 2018-09-13 MED FILL — ALBUTEROL SUL 2.5 MG/3 ML S: (2.5 MG/3ML | 12 days supply | Qty: 150 | Fill #1

## 2018-09-13 MED FILL — DICLOFENAC SODIUM 1% GEL: 1 | 6 days supply | Qty: 100 | Fill #2

## 2018-09-13 MED FILL — ?ESOMEPRAZOLE MAG DR 20MG C: 20 | 30 days supply | Qty: 60 | Fill #2

## 2018-09-13 NOTE — Progress Notes (Deleted)
Subjective:    Patient ID: Jeremy Booker, male    DOB: [birth year], 53 y.o.   MRN: [id number]  52 y.o.M referred for COPD eval from PCPfrom visit 08/24/18 Pt has been in ED 4 x since 05/2018 This is a telephone visit.    This patient has severe chronic obstructive lung disease and has had recent hospitalizations for acute respiratory failure on a hypoxic basis.  The patient no longer is smoking at this time.  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  Patient has a pre-existing history of sleep apnea and was in the process of obtaining a CPAP machine when he became incarcerated.  He has had a recent sleep study performed and was in the process of getting mask desensitization when he became incarcerated.  He also had a nebulizer machine which he no longer has access.  The patient states his dyspnea is getting much worse over the past several weeks.  He is using his handheld inhaler on a daily basis.  He also was due to receive a walker and cane and has not been able to pick this up yet as well.  Patient has a previous history of alcohol use and cocaine use he states he is no longer using either of these substances.  Note he has had a recent colonoscopy in November 2019 which is a follow-up on previous colon cancer with a cecal mass and right hemicolectomy.  He also has had a history of pulmonary embolus and is on chronic anticoagulation for same.  Patient also has chronic atrial fibrillation and hypertension.     Assessment and Plan: #1 chronic obstructive lung disease with chronic bronchitic and emphysematous components.  Currently the patient is not on supplemental oxygen.  Patient has associated sleep apnea and has yet to obtain a CPAP device  #2 obstructive sleep apnea again has not yet obtained a CPAP device  Plan             It became immediately clear if this patient needed an in office visit therefore 1 will be scheduled on 09/14/2018 No changes in  the patient's medication plan will be made at this time The patient knows to come in on 09/14/2018 Follow Up Instructions: Shortness of Breath  This is a chronic problem. The current episode started more than 1 year ago. The problem occurs constantly. The problem has been gradually worsening. Associated symptoms include chest pain, leg swelling, orthopnea, PND, sputum production and wheezing. The symptoms are aggravated by lying flat, any activity and exercise. Risk factors include smoking. He has tried beta agonist inhalers, oral steroids and steroid inhalers for the symptoms. The treatment provided moderate relief. His past medical history is significant for COPD and PE. There is no history of asthma, CAD, DVT, a heart failure or pneumonia.      Review of Systems  Respiratory: Positive for sputum production, shortness of breath and wheezing.   Cardiovascular: Positive for chest pain, orthopnea, leg swelling and PND.   Constitutional: Negative for fever.  Respiratory: Positive for sputum production, shortness of breath and wheezing. Negative for hemoptysis.   Cardiovascular: Positive for chest pain, orthopnea, leg swelling and PND.      Objective:   Physical Exam There were no vitals filed for this visit.  Gen: Pleasant, well-nourished, in no distress,  normal affect  ENT: No lesions,  mouth clear,  oropharynx clear, no postnasal drip  Neck: No JVD, no TMG, no carotid bruits  Lungs: No use of accessory muscles, no dullness to percussion, clear without rales or rhonchi  Cardiovascular: RRR, heart sounds normal, no murmur or gallops, no peripheral edema  Abdomen: soft and NT, no HSM,  BS normal  Musculoskeletal: No deformities, no cyanosis or clubbing  Neuro: alert, non focal  Skin: Warm, no lesions or rashes  08/21/18 CXR: IMPRESSION: Chronic lung changes without evidence of acute cardiopulmonary disease.  Surgical changes of median sternotomy  2016 CT Angio Chest:  IMPRESSION: 1. No evidence of acute pulmonary emboli. 2. Interval partial recanalization of the right lower lobe pulmonary artery and the multiple segmental branches which were involved by the prior emboli. Chronic filling defects persist, however. 3. COPD/emphysema. Scattered areas of hyperlucency in both lungs consistent with localized air trapping mass can be seen in patients with asthma. No acute cardiopulmonary disease otherwise. 4. Marked right ventricular enlargement and right ventricular hypertrophy, moderate to marked left ventricular hypertrophy, marked right atrial enlargement, and marked dilation of the pulmonic valve leaflets, unchanged since the prior examinations.         Assessment & Plan:

## 2018-09-13 NOTE — Progress Notes (Signed)
Feels like breathing is worse- Needs nebulizer or CPAP machine-  SOB and shaking at times when he sits Back pain- taking Tramadol but does not work.

## 2018-09-14 ENCOUNTER — Ambulatory Visit: Payer: Self-pay | Admitting: Critical Care Medicine

## 2018-09-27 ENCOUNTER — Other Ambulatory Visit: Payer: Self-pay

## 2018-09-27 ENCOUNTER — Other Ambulatory Visit: Payer: Self-pay | Admitting: Pharmacist

## 2018-09-27 MED ORDER — PREDNISONE 20 MG PO TABS: ORAL_TABLET | ORAL | 0 refills | Status: DC

## 2018-09-28 ENCOUNTER — Telehealth: Payer: Self-pay | Admitting: Physician Assistant

## 2018-09-28 NOTE — Telephone Encounter (Signed)
This patient is established with Dr. Camillia Herter. Last seen by Melina Copa 03/2016. Can you schedule a follow-up telemedicine appt with Dr. Juanda Crumble in 2-4 weeks? Please remove patient from the new referral queue. Thank you   Need to obtain consent for virtual appt- left message on machine for patient to call back and set up appt with [facility]

## 2018-09-30 NOTE — Telephone Encounter (Signed)
Left message for pt to call back and setup virtual visit with Melina Copa

## 2018-10-06 MED FILL — ?ESOMEPRAZOLE MAG DR 20MG C: 20 | 30 days supply | Qty: 60 | Fill #2

## 2018-10-06 MED FILL — ALBUTEROL SUL 2.5 MG/3 ML S: (2.5 MG/3ML | 12 days supply | Qty: 150 | Fill #1

## 2018-10-06 MED FILL — !DULERA 100 MCG/5 MCG INH: 100-5 | 30 days supply | Qty: 13 | Fill #0

## 2018-10-06 MED FILL — GABAPENTIN 300 MG CAPSULE: 300 | 30 days supply | Qty: 120 | Fill #0

## 2018-10-06 MED FILL — ?AMLODIPINE BESYLATE 10 MG: 10 | 30 days supply | Qty: 30 | Fill #0

## 2018-10-06 MED FILL — METHOCARBAMOL 750 MG TABS: 750 | 30 days supply | Qty: 30 | Fill #1

## 2018-10-06 MED FILL — $ELIQUIS 5 MG TABLET: 5 | 30 days supply | Qty: 60 | Fill #0

## 2018-10-06 MED FILL — predniSONE 20 MG TABS: 20 | 5 days supply | Qty: 10 | Fill #0

## 2018-10-06 MED FILL — $VENTOLIN HFA 18G INHALER: 108 (90 BAS | 25 days supply | Qty: 18 | Fill #1

## 2018-10-06 MED FILL — ?ATORVASTATIN 40MG TABLET: 40 | 30 days supply | Qty: 30 | Fill #0

## 2018-10-06 MED FILL — ?DULoxetine HCL 60 MG CPEP: 60 | 30 days supply | Qty: 30 | Fill #0

## 2018-10-07 ENCOUNTER — Ambulatory Visit: Payer: Self-pay | Admitting: Critical Care Medicine

## 2018-10-07 NOTE — Progress Notes (Deleted)
Subjective:    Patient ID: Jeremy Booker, male    DOB: Jul 11, 1965, 53 y.o.   MRN: [id number]  52 y.o.M referred for COPD eval from PCPfrom visit 08/24/18 Pt has been in ED 4 x since 05/2018 This is a telephone visit.    This patient has severe chronic obstructive lung disease and has had recent hospitalizations for acute respiratory failure on a hypoxic basis.  The patient no longer is smoking at this time.  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  Patient has a pre-existing history of sleep apnea and was in the process of obtaining a CPAP machine when he became incarcerated.  He has had a recent sleep study performed and was in the process of getting mask desensitization when he became incarcerated.  He also had a nebulizer machine which he no longer has access.  The patient states his dyspnea is getting much worse over the past several weeks.  He is using his handheld inhaler on a daily basis.  He also was due to receive a walker and cane and has not been able to pick this up yet as well.  Patient has a previous history of alcohol use and cocaine use he states he is no longer using either of these substances.  Note he has had a recent colonoscopy in November 2019 which is a follow-up on previous colon cancer with a cecal mass and right hemicolectomy.  He also has had a history of pulmonary embolus and is on chronic anticoagulation for same.  Patient also has chronic atrial fibrillation and hypertension.  Please review shortness of breath assessment below  Shortness of Breath  This is a chronic problem. The current episode started more than 1 year ago. The problem occurs constantly. The problem has been gradually worsening. Associated symptoms include chest pain, leg swelling, orthopnea, PND, sputum production and wheezing. Pertinent negatives include no fever or hemoptysis. The symptoms are aggravated by lying flat, any activity and exercise. Associated  symptoms comments: Mucus . Risk factors include smoking. He has tried beta agonist inhalers, oral steroids and steroid inhalers for the symptoms. The treatment provided moderate relief. His past medical history is significant for COPD and PE.      Review of Systems  Constitutional: Negative for fever.  Respiratory: Positive for sputum production, shortness of breath and wheezing. Negative for hemoptysis.   Cardiovascular: Positive for chest pain, orthopnea, leg swelling and PND.   Observations/Objective: 08/21/18 CXR: IMPRESSION: Chronic lung changes without evidence of acute cardiopulmonary disease.  Surgical changes of median sternotomy  2016 CT Angio Chest: IMPRESSION: 1. No evidence of acute pulmonary emboli. 2. Interval partial recanalization of the right lower lobe pulmonary artery and the multiple segmental branches which were involved by the prior emboli. Chronic filling defects persist, however. 3. COPD/emphysema. Scattered areas of hyperlucency in both lungs consistent with localized air trapping mass can be seen in patients with asthma. No acute cardiopulmonary disease otherwise. 4. Marked right ventricular enlargement and right ventricular hypertrophy, moderate to marked left ventricular hypertrophy, marked right atrial enlargement, and marked dilation of the pulmonic valve leaflets, unchanged since the prior examinations.  Assessment and Plan: #1 chronic obstructive lung disease with chronic bronchitic and emphysematous components.  Currently the patient is not on supplemental oxygen.  Patient has associated sleep apnea and has yet to obtain a CPAP device  #2 obstructive sleep apnea again has not yet obtained a CPAP device      Review  of Systems     Objective:   Physical Exam        Assessment & Plan:

## 2018-10-12 ENCOUNTER — Ambulatory Visit: Payer: Self-pay | Attending: Internal Medicine | Admitting: Internal Medicine

## 2018-10-12 ENCOUNTER — Other Ambulatory Visit: Payer: Self-pay

## 2018-10-12 ENCOUNTER — Telehealth: Payer: Self-pay

## 2018-10-12 NOTE — Telephone Encounter (Signed)
Pt had a Televisit scheduled for 10/12/18 at 2:10pm. Tried reaching pt multiple times with no success  *LVM* 10/12/18 at 153pm *LVM* 10/12/18 at 204pm *LVM* 10/12/18 at 218pm *LVM* 10/12/18 vat 231pm  Last 2 times I reached pt he calls back. The first time Maite spoke with him I informed her to let pt know that I will call him right back. Called pt back phone ranged 1 time lvm. Pt then called back again and spoke with Jazmine I skyped Dr. Wynetta Emery to make her aware that pt called back per Dr. Wynetta Emery we will  See if we have time at the end of the day.  I informed Jaszzmine of Dr. Wynetta Emery response and made pt aware.  Per Dr. Wynetta Emery reschedule pt. And make pt aware that the cardiologist has been trying to reach him to schedule   Contacted pt at 456pm to reschedule and pt didn't answer LVM

## 2018-11-02 ENCOUNTER — Ambulatory Visit: Payer: Self-pay | Admitting: Internal Medicine

## 2018-11-04 ENCOUNTER — Ambulatory Visit: Payer: Self-pay | Admitting: Critical Care Medicine

## 2018-11-15 ENCOUNTER — Other Ambulatory Visit: Payer: Self-pay | Admitting: Internal Medicine

## 2018-11-15 DIAGNOSIS — Z79899 Other long term (current) drug therapy: Secondary | ICD-10-CM

## 2018-11-15 DIAGNOSIS — G8929 Other chronic pain: Secondary | ICD-10-CM

## 2018-11-15 MED FILL — ?ATORVASTATIN 40MG TABLET: 40 | 30 days supply | Qty: 30 | Fill #1

## 2018-11-15 MED FILL — GABAPENTIN 300 MG CAPSULE: 300 | 30 days supply | Qty: 120 | Fill #1

## 2018-11-15 MED FILL — ?DULoxetine HCL 60 MG CPEP: 60 | 30 days supply | Qty: 30 | Fill #1

## 2018-11-15 MED FILL — ?AMLODIPINE BESYLATE 10 MG: 10 | 30 days supply | Qty: 30 | Fill #1

## 2018-11-15 MED FILL — ?ESOMEPRAZOLE MAG DR 20MG C: 20 | 30 days supply | Qty: 60 | Fill #3

## 2018-11-15 MED FILL — METHOCARBAMOL 750 MG TABS: 750 | 30 days supply | Qty: 30 | Fill #2

## 2018-11-15 MED FILL — $VENTOLIN HFA 18G INHALER: 108 (90 BAS | 28 days supply | Qty: 18 | Fill #0

## 2018-11-15 MED FILL — !ELIQUIS 5MG TABLET: 5 | 30 days supply | Qty: 60 | Fill #1

## 2018-11-15 MED FILL — !DULERA 100 MCG/5 MCG INH: 100-5 | 30 days supply | Qty: 13 | Fill #1

## 2018-11-16 NOTE — Telephone Encounter (Signed)
No VM option. Placed script at front desk for pick up

## 2018-11-18 ENCOUNTER — Ambulatory Visit: Payer: Self-pay | Admitting: Critical Care Medicine

## 2018-11-18 NOTE — Progress Notes (Deleted)
Subjective:    Patient ID: Jeremy Booker, male    DOB: Aug 09, 1965, 53 y.o.   MRN: [id number]  52 y.o.M referred for COPD eval from PCPfrom visit 08/24/18 Pt has been in ED 4 x since 05/2018 This is a telephone visit.    This patient has severe chronic obstructive lung disease and has had recent hospitalizations for acute respiratory failure on a hypoxic basis.  The patient no longer is smoking at this time.  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  Patient has a pre-existing history of sleep apnea and was in the process of obtaining a CPAP machine when he became incarcerated.  He has had a recent sleep study performed and was in the process of getting mask desensitization when he became incarcerated.  He also had a nebulizer machine which he no longer has access.  The patient states his dyspnea is getting much worse over the past several weeks.  He is using his handheld inhaler on a daily basis.  He also was due to receive a walker and cane and has not been able to pick this up yet as well.  Patient has a previous history of alcohol use and cocaine use he states he is no longer using either of these substances.  Note he has had a recent colonoscopy in November 2019 which is a follow-up on previous colon cancer with a cecal mass and right hemicolectomy.  He also has had a history of pulmonary embolus and is on chronic anticoagulation for same.  Patient also has chronic atrial fibrillation and hypertension.  Please review shortness of breath assessment below  Shortness of Breath  This is a chronic problem. The current episode started more than 1 year ago. The problem occurs constantly. The problem has been gradually worsening. Associated symptoms include chest pain, leg swelling, orthopnea, PND, sputum production and wheezing. Pertinent negatives include no fever or hemoptysis. The symptoms are aggravated by lying flat, any activity and exercise. Associated symptoms  comments: Mucus . Risk factors include smoking. He has tried beta agonist inhalers, oral steroids and steroid inhalers for the symptoms. The treatment provided moderate relief. His past medical history is significant for COPD and PE.      Review of Systems  Constitutional: Negative for fever.  Respiratory: Positive for sputum production, shortness of breath and wheezing. Negative for hemoptysis.   Cardiovascular: Positive for chest pain, orthopnea, leg swelling and PND.   Observations/Objective: 08/21/18 CXR: IMPRESSION: Chronic lung changes without evidence of acute cardiopulmonary disease.  Surgical changes of median sternotomy  2016 CT Angio Chest: IMPRESSION: 1. No evidence of acute pulmonary emboli. 2. Interval partial recanalization of the right lower lobe pulmonary artery and the multiple segmental branches which were involved by the prior emboli. Chronic filling defects persist, however. 3. COPD/emphysema. Scattered areas of hyperlucency in both lungs consistent with localized air trapping mass can be seen in patients with asthma. No acute cardiopulmonary disease otherwise. 4. Marked right ventricular enlargement and right ventricular hypertrophy, moderate to marked left ventricular hypertrophy, marked right atrial enlargement, and marked dilation of the pulmonic valve leaflets, unchanged since the prior examinations.  Assessment and Plan: #1 chronic obstructive lung disease with chronic bronchitic and emphysematous components.  Currently the patient is not on supplemental oxygen.  Patient has associated sleep apnea and has yet to obtain a CPAP device  #2 obstructive sleep apnea again has not yet obtained a CPAP device  Plan  It became immediately  clear if this patient needed an in office visit therefore 1 will be scheduled on 09/14/2018 No changes in the patient's medication plan will be made at this time The patient knows to come in on 09/14/2018      Review of  Systems     Objective:   Physical Exam        Assessment & Plan:

## 2018-11-19 MED FILL — traMADol HCL 50 MG TABS: 50 | 30 days supply | Qty: 60 | Fill #0

## 2018-11-26 ENCOUNTER — Other Ambulatory Visit: Payer: Self-pay

## 2018-11-26 ENCOUNTER — Encounter: Payer: Self-pay | Admitting: Internal Medicine

## 2018-11-26 ENCOUNTER — Ambulatory Visit (HOSPITAL_COMMUNITY)
Admission: RE | Admit: 2018-11-26 | Discharge: 2018-11-26 | Disposition: A | Discharge status: Home / Self Care | Payer: Medicaid Other | Source: Ambulatory Visit | Attending: Family Medicine | Admitting: Family Medicine

## 2018-11-26 ENCOUNTER — Ambulatory Visit: Payer: Self-pay | Attending: Internal Medicine | Admitting: Internal Medicine

## 2018-11-26 VITALS — BP 130/84 | HR 71 | Temp 97.9°F | Resp 16 | Wt 175.6 lb

## 2018-11-26 DIAGNOSIS — R0789 Other chest pain: Secondary | ICD-10-CM

## 2018-11-26 DIAGNOSIS — Q256 Stenosis of pulmonary artery: Secondary | ICD-10-CM

## 2018-11-26 DIAGNOSIS — G8929 Other chronic pain: Secondary | ICD-10-CM

## 2018-11-26 DIAGNOSIS — Q231 Congenital insufficiency of aortic valve: Secondary | ICD-10-CM

## 2018-11-26 DIAGNOSIS — J449 Chronic obstructive pulmonary disease, unspecified: Secondary | ICD-10-CM

## 2018-11-26 DIAGNOSIS — F1910 Other psychoactive substance abuse, uncomplicated: Secondary | ICD-10-CM

## 2018-11-26 DIAGNOSIS — Z8659 Personal history of other mental and behavioral disorders: Secondary | ICD-10-CM

## 2018-11-26 DIAGNOSIS — M545 Low back pain, unspecified: Secondary | ICD-10-CM

## 2018-11-26 DIAGNOSIS — R079 Chest pain, unspecified: Secondary | ICD-10-CM | POA: Diagnosis present

## 2018-11-26 DIAGNOSIS — R12 Heartburn: Secondary | ICD-10-CM

## 2018-11-26 MED ORDER — ALBUTEROL SULFATE (2.5 MG/3ML) 0.083% IN NEBU: INHALATION_SOLUTION | RESPIRATORY_TRACT | 1 refills | Status: AC

## 2018-11-26 MED ORDER — ESOMEPRAZOLE MAGNESIUM 20 MG PO CPDR: 20.0000 mg | DELAYED_RELEASE_CAPSULE | Freq: Two times a day (BID) | ORAL | 2 refills | Status: DC

## 2018-11-26 MED FILL — !DULERA 100 MCG/5 MCG INH: 100-5 | 30 days supply | Qty: 13 | Fill #2

## 2018-11-26 MED FILL — ?AMLODIPINE BESYLATE 10 MG: 10 | 30 days supply | Qty: 30 | Fill #2

## 2018-11-26 MED FILL — ALBUTEROL SUL 2.5 MG/3 ML S: (2.5 MG/3ML | 12 days supply | Qty: 150 | Fill #0

## 2018-11-26 MED FILL — ?DULoxetine HCL 60 MG CPEP: 60 | 30 days supply | Qty: 30 | Fill #2

## 2018-11-26 MED FILL — $VENTOLIN HFA 18G INHALER: 108 (90 BAS | 28 days supply | Qty: 18 | Fill #1

## 2018-11-26 MED FILL — !ELIQUIS 5MG TABLET: 5 | 30 days supply | Qty: 60 | Fill #2

## 2018-11-26 MED FILL — METHOCARBAMOL 750 MG TABS: 750 | 30 days supply | Qty: 30 | Fill #3

## 2018-11-26 NOTE — Patient Instructions (Signed)
Please give patient an appointment with Dr. Joya Gaskins for follow-up on COPD.  Please get yourself into a treatment program for your substance abuse.  We will not be able to continue prescribing tramadol for you if you continue to use street drugs and drink alcohol in excess.

## 2018-11-26 NOTE — Progress Notes (Signed)
Pt states he forgot all his medications in his friend car so he doesn't have any medications  Pt states 1 month ago he fell because his back went out on him and he hurt his left shoulder  Pt is needing a walker with the chair to where he can sit when he gets out of breathe

## 2018-11-26 NOTE — Progress Notes (Signed)
Patient ID: Jeremy Booker, male    DOB: [birth year]  MRN: [id number]  CC: Follow-up and Chest Pain   Subjective: Jeremy Booker is a 53 y.o. 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  [address] a few times.  Given cane on last visit but he accidentally left it in his friend's car a few days ago.  He is on tramadol and gabapentin. On last visit he told me that he had an appointment with a spine specialist in [facility].  However he tells me that he was unable to keep the appointment because it was  $200 to be seen.  He wants to know if I can refer him to someone here in [place].  He does not have the orange card or cone discount  COPD: Spoke with Dr. Joya Gaskins on a telephone visit back in May.  Plan was for an in person visit but patient did not keep appointment.  He has transportation issues.  Feels breathing worse. Does not have inhalers.  He accidentally left all of his medications in a friend's car earlier this week a day or 2 after he got them refilled.  He is requesting refills on all medications today He tells me he was seen in the ER on 11/21/2018 at [facility] for shortness of breath and was told that he may have had a small heart attack.  He was not admitted.  However I reviewed the ER's note.  Troponin levels were negative.  He had CTA of the chest that was negative for any acute PE.  It did reveal an abnormal appearing main pulmonary outflow track with dilation at the level of the pulmonary valve and stenosis at the level of the proximal main pulmonary trunk.  He had mild enlargement of the heart and moderate upper lobe predominant emphysema.  Incidental finding of 9  mm pulmonary nodule in the right upper lobe.   Urine drug screen was positive for cocaine.  He tells me he has not used street drugs in several years and denies cocaine use.  Blood alcohol level was also elevated.  Patient states that he was going through a lot mentally at that time.  He continues to follow with his behavioral health provider at [facility]  Refer to cardiology on last visit for history of bicuspid aortic valve.  However he still has not applied for the orange card or cone discount.  He thinks he has the cone discount but he does not.  Patient Active Problem List   Diagnosis Date Noted   Idiopathic hypotension 03/31/2018   OSA on CPAP 03/31/2018   Pleuritic chest pain 11/25/2017   Obstructive sleep apnea 01/06/2017   Esophagitis, unspecified 10/08/2016   Lower GI bleed 05/22/2016   S/P right hemicolectomy 02/20/2016   Atrial fibrillation ([place]) 02/18/2016   Current smoker 06/18/2015   Left anterior knee pain 05/04/2015   RVH (right ventricular hypertrophy) 03/08/2015   Chronic low back pain 01/26/2015   History of pulmonary embolism 01/11/2015   Dyspnea 12/04/2014   Solitary pulmonary nodule 07/20/2014   Pulmonary embolus ([place]) 07/03/2014   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 180 mL 1   albuterol (VENTOLIN HFA) 108 (90 Base) MCG/ACT inhaler INHALE 2 PUFFS INTO THE LUNGS EVERY 6 HOURS AS NEEDED FOR WHEEZING OR SHORTNESS OF BREATH. 18 g 3   amLODipine (NORVASC) 10 MG tablet Take  1 tablet (10 mg total) by mouth daily. 30 tablet 5   apixaban (ELIQUIS) 5 MG TABS tablet Take 1 tablet (5 mg total) by mouth 2 (two) times daily. 60 tablet 5   atorvastatin (LIPITOR) 40 MG tablet Take 1 tablet (40 mg total) by mouth daily. 30 tablet 11   diclofenac sodium (VOLTAREN) 1 % GEL Apply 4 g topically 4 (four) times daily. 100 g 2   DULoxetine (CYMBALTA) 60 MG capsule Take 1 capsule (60 mg total) by mouth daily. 30 capsule 5   esomeprazole (NEXIUM) 20 MG capsule Take 1 capsule (20 mg total) by mouth 2 (two) times daily before a meal. 60 capsule 5   gabapentin (NEURONTIN) 300 MG capsule TAKE 2 CAPSULES BY MOUTH 2 TIMES DAILY. 120 capsule 0   methocarbamol (ROBAXIN) 750 MG tablet Take 1 tablet (750 mg total) by mouth at bedtime as needed for muscle spasms. 30 tablet 4   Misc. Devices (WALKER) MISC Use as indicated 1 each 0   mometasone-formoterol (DULERA) 100-5 MCG/ACT AERO Inhale 2 puffs into the lungs 2 (two) times daily. 3 Inhaler 3   nitroGLYCERIN (NITROSTAT) 0.4 MG SL tablet Place 1 tablet (0.4 mg total) under the tongue every 5 (five) minutes as needed for chest pain. Daily max- 3 doses 50 tablet 1   predniSONE (DELTASONE) 20 MG tablet Take 2 tablets by mouth daily for 5 days. 10 tablet 0   traMADol (ULTRAM) 50 MG tablet TAKE 1 TABLET (50 MG TOTAL) BY MOUTH 2 TIMES DAILY AS NEEDED. 60 tablet 0   No current facility-administered medications on file prior to visit.     Allergies  Allergen Reactions   Buprenorphine Hcl Shortness Of Breath   Gadolinium Derivatives Anaphylaxis and Other (See Comments)   Morphine And Related Shortness Of Breath   Baclofen    Dilaudid [Hydromorphone Hcl] Itching   Morphine Palpitations   Naprosyn [Naproxen] Itching and Rash    Patient has taken ibuprofen and tolerates that just fine Patient has taken ibuprofen and tolerates that just fine    Social History   Socioeconomic History   Marital status: Single    Spouse name: Not  on file   Number of children: 3   Years of education: Not on file   Highest education level: Not on file  Occupational History   Occupation: none  Social Designer, fashion/clothing strain: Not on file   Food insecurity    Worry: Not on file    Inability: Not on file   Transportation needs    Medical: Not on file    Non-medical: Not on file  Tobacco Use   Smoking status: Former Smoker    Packs/day: 0.00    Years: 30.00    Pack years: 0.00    Types: Cigarettes  Quit date: 09/29/2015    Years since quitting: 3.1   Smokeless tobacco: Never Used  Substance and Sexual Activity   Alcohol use: No    Alcohol/week: 0.0 standard drinks    Comment: Previously drank heavily on a daily basis.  Now drinks a few bottles of wine 1-2 x /wk (06/2014)   Drug use: No    Types: Cocaine, Marijuana    Comment: last used Jan  2016   Sexual activity: Not on file  Lifestyle   Physical activity    Days per week: Not on file    Minutes per session: Not on file   Stress: Not on file  Relationships   Social connections    Talks on phone: Not on file    Gets together: Not on file    Attends religious service: Not on file    Active member of club or organization: Not on file    Attends meetings of clubs or organizations: Not on file    Relationship status: Not on file   Intimate partner violence    Fear of current or ex partner: Not on file    Emotionally abused: Not on file    Physically abused: Not on file    Forced sexual activity: Not on file  Other Topics Concern   Not on file  Social History Narrative   Was living in [place].  Now living with girlfriend in [place].  Unemployed and possibly pending disability.    Family History  Problem Relation Age of Onset   Other Father        died in late 102's - ? cause   Hypertension Mother        alive   Clotting disorder Mother    Hypertension Maternal Uncle    Heart disease Maternal Uncle    Heart disease Sister     Cancer Brother        type unknown   Cancer Maternal Uncle        x 3, type unknown   Heart attack Neg Hx    Stroke Neg Hx     Past Surgical History:  Procedure Laterality Date   APPENDECTOMY     CARDIAC SURGERY     congenital    TONSILLECTOMY      ROS: Review of Systems Negative except as stated above  PHYSICAL EXAM: BP 130/84    Pulse 71    Temp 97.9 F (36.6 C) (Oral)    Resp 16    Wt 175 lb 9.6 oz (79.7 kg)    SpO2 97%    BMI 24.49 kg/m   Physical Exam  General appearance - alert, well appearing, and in no distress Mental status - normal mood, behavior, speech, dress, motor activity, and thought processes Neck - supple, no significant adenopathy Chest - clear to auscultation, no wheezes, rales or rhonchi, symmetric air entry.  Good air entry. Heart -regular rate rhythm.   Musculoskeletal -he ambulates unassisted today.  Gait is steady.   Extremities -no lower extremity edema  CMP Latest Ref Rng & Units 03/31/2017 11/06/2016 12/21/2014  Glucose 65 - 99 mg/dL 75 85 100(H)  BUN 6 - 24 mg/dL 9 9 6   Creatinine 0.76 - 1.27 mg/dL 0.98 0.92 1.02  Sodium 134 - 144 mmol/L 140 141 139  Potassium 3.5 - 5.2 mmol/L 4.3 4.1 3.7  Chloride 96 - 106 mmol/L 102 100 107  CO2 20 - 29 mmol/L 24 22 25   Calcium 8.7 - 10.2 mg/dL  9.7 10.1 9.3  Total Protein 6.0 - 8.5 g/dL 7.3 7.9 -  Total Bilirubin 0.0 - 1.2 mg/dL 0.7 0.7 -  Alkaline Phos 39 - 117 IU/L 79 95 -  AST 0 - 40 IU/L 21 22 -  ALT 0 - 44 IU/L 24 21 -   Lipid Panel     Component Value Date/Time   CHOL 192 07/03/2014 0130   TRIG 149 07/03/2014 0130   HDL 51 07/03/2014 0130   CHOLHDL 3.8 07/03/2014 0130   VLDL 30 07/03/2014 0130   LDLCALC 111 (H) 07/03/2014 0130    CBC    Component Value Date/Time   WBC 4.3 03/31/2017 1035   WBC 6.0 05/22/2016 1432   RBC 4.46 03/31/2017 1035   RBC 4.49 05/22/2016 1432   HGB 13.4 03/31/2017 1035   HCT 39.6 03/31/2017 1035   PLT 239 03/31/2017 1035   MCV 89 03/31/2017 1035    MCH 30.0 03/31/2017 1035   MCH 30.3 05/22/2016 1432   MCHC 33.8 03/31/2017 1035   MCHC 33.3 05/22/2016 1432   RDW 14.2 03/31/2017 1035   LYMPHSABS 1.5 12/21/2014 1025   MONOABS 0.5 12/21/2014 1025   EOSABS 0.1 12/21/2014 1025   BASOSABS 0.0 12/21/2014 1025    ASSESSMENT AND PLAN: 1. Chronic obstructive pulmonary disease, unspecified COPD type ([place]) -Patient will get his inhalers filled today at the pharmacy. I have asked the front desk to reschedule his appointment with Dr. Joya Gaskins.  2. Polysubstance abuse [facility]) Encourage patient to seek treatment for his substance use.  Chances are slim to none that urine drug screen positive for cocaine was an erroneous reading. Also advised that if he does not get treatment and subsequent random urine drug screen is positive for street drugs, I will no longer prescribe tramadol.  Patient expressed understanding  3. Chronic midline low back pain, unspecified whether sciatica present He will call his friend to try get the cane that was left in the car.  He will continue on gabapentin, Cymbalta and tramadol -Can refer to spine specialist but needs to be approved for the orange card/cone discount  4. Bicuspid aortic valve 5. Pulmonary artery stenosis Need to get him to cardiology.  I stressed the importance of him getting the application for the orange card/cone discount and completing them as soon as possible.  Once approved we can resubmit the referral to cardiology  6. History of recent stressful life event Followed by behavioral health at [facility]   Patient was given the opportunity to ask questions.  Patient verbalized understanding of the plan and was able to repeat key elements of the plan.   No orders of the defined types were placed in this encounter.    Requested Prescriptions    No prescriptions requested or ordered in this encounter    No follow-ups on file.  Karle Plumber, MD, FACP

## 2018-11-27 LAB — DRUG SCREEN 764883 11+OXYCO+ALC+CRT-BUND
Amphetamines, Urine: NEGATIVE ng/mL
BENZODIAZ UR QL: NEGATIVE ng/mL
Barbiturate: NEGATIVE ng/mL
Cannabinoid Quant, Ur: NEGATIVE ng/mL
Creatinine: 169.7 mg/dL (ref 20.0–300.0)
Ethanol: NEGATIVE %
Meperidine: NEGATIVE ng/mL
Methadone Screen, Urine: NEGATIVE ng/mL
OPIATE SCREEN URINE: NEGATIVE ng/mL
Oxycodone/Oxymorphone, Urine: NEGATIVE ng/mL
Phencyclidine: NEGATIVE ng/mL
Propoxyphene: NEGATIVE ng/mL
Tramadol: NEGATIVE ng/mL
pH, Urine: 6.3 (ref 4.5–8.9)

## 2018-11-27 LAB — COCAINE CONF, UR
Benzoylecgonine GC/MS Conf: 317 ng/mL
Cocaine Metab Quant, Ur: POSITIVE — AB

## 2018-11-28 MED ORDER — GABAPENTIN 300 MG PO CAPS: ORAL_CAPSULE | ORAL | 3 refills | Status: DC

## 2018-11-29 ENCOUNTER — Telehealth: Payer: Self-pay

## 2018-11-29 NOTE — Telephone Encounter (Signed)
Contacted pt to go over urine results pt didn't answer was unable to lvm due to vm not being setup

## 2018-12-09 ENCOUNTER — Ambulatory Visit: Payer: Self-pay | Admitting: Internal Medicine

## 2018-12-14 ENCOUNTER — Ambulatory Visit: Payer: Self-pay | Admitting: Critical Care Medicine

## 2018-12-14 NOTE — Progress Notes (Deleted)
Subjective:    Patient ID: Jeremy Booker, male    DOB: [birth year], 53 y.o.   MRN: [id number]  52 y.o.M referred for COPD eval from PCPfrom visit 08/24/18 Pt has been in ED 4 x since 05/2018 This is a telephone visit.    This patient has severe chronic obstructive lung disease and has had recent hospitalizations for acute respiratory failure on a hypoxic basis.  The patient no longer is smoking at this time.  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  Patient has a pre-existing history of sleep apnea and was in the process of obtaining a CPAP machine when he became incarcerated.  He has had a recent sleep study performed and was in the process of getting mask desensitization when he became incarcerated.  He also had a nebulizer machine which he no longer has access.  The patient states his dyspnea is getting much worse over the past several weeks.  He is using his handheld inhaler on a daily basis.  He also was due to receive a walker and cane and has not been able to pick this up yet as well.  Patient has a previous history of alcohol use and cocaine use he states he is no longer using either of these substances.  Note he has had a recent colonoscopy in November 2019 which is a follow-up on previous colon cancer with a cecal mass and right hemicolectomy.  He also has had a history of pulmonary embolus and is on chronic anticoagulation for same.  Patient also has chronic atrial fibrillation and hypertension.  Please review shortness of breath assessment below  Shortness of Breath  This is a chronic problem. The current episode started more than 1 year ago. The problem occurs constantly. The problem has been gradually worsening. Associated symptoms include chest pain, leg swelling, orthopnea, PND, sputum production and wheezing. Pertinent negatives include no fever or hemoptysis. The symptoms are aggravated by lying flat, any activity and exercise. Associated symptoms  comments: Mucus . Risk factors include smoking. He has tried beta agonist inhalers, oral steroids and steroid inhalers for the symptoms. The treatment provided moderate relief. His past medical history is significant for COPD and PE.      Review of Systems  Constitutional: Negative for fever.  Respiratory: Positive for sputum production, shortness of breath and wheezing. Negative for hemoptysis.   Cardiovascular: Positive for chest pain, orthopnea, leg swelling and PND.   Observations/Objective: 08/21/18 CXR: IMPRESSION: Chronic lung changes without evidence of acute cardiopulmonary disease.  Surgical changes of median sternotomy  2016 CT Angio Chest: IMPRESSION: 1. No evidence of acute pulmonary emboli. 2. Interval partial recanalization of the right lower lobe pulmonary artery and the multiple segmental branches which were involved by the prior emboli. Chronic filling defects persist, however. 3. COPD/emphysema. Scattered areas of hyperlucency in both lungs consistent with localized air trapping mass can be seen in patients with asthma. No acute cardiopulmonary disease otherwise. 4. Marked right ventricular enlargement and right ventricular hypertrophy, moderate to marked left ventricular hypertrophy, marked right atrial enlargement, and marked dilation of the pulmonic valve leaflets, unchanged since the prior examinations.  Assessment and Plan: #1 chronic obstructive lung disease with chronic bronchitic and emphysematous components.  Currently the patient is not on supplemental oxygen.  Patient has associated sleep apnea and has yet to obtain a CPAP device  #2 obstructive sleep apnea again has not yet obtained a CPAP device  Plan  It became immediately  clear if this patient needed an in office visit therefore 1 will be scheduled on 09/14/2018 No changes in the patient's medication plan will be made at this time The patient knows to come in on 09/14/2018      Review of  Systems     Objective:   Physical Exam        Assessment & Plan:

## 2019-01-19 ENCOUNTER — Other Ambulatory Visit: Payer: Self-pay | Admitting: Internal Medicine

## 2019-01-19 DIAGNOSIS — M5440 Lumbago with sciatica, unspecified side: Secondary | ICD-10-CM

## 2019-01-19 DIAGNOSIS — Z79899 Other long term (current) drug therapy: Secondary | ICD-10-CM

## 2019-01-19 DIAGNOSIS — G8929 Other chronic pain: Secondary | ICD-10-CM

## 2019-01-19 MED FILL — !ELIQUIS 5MG TABLET: 5 | 30 days supply | Qty: 60 | Fill #3

## 2019-01-19 MED FILL — $Dulera 100 mcg Inhaler: 100-5 | 30 days supply | Qty: 13 | Fill #3

## 2019-01-19 MED FILL — $VENTOLIN HFA 18G INHALER: 108 (90 BAS | 25 days supply | Qty: 18 | Fill #2

## 2019-01-19 MED FILL — ?ATORVASTATIN 40MG TABLET: 40 | 30 days supply | Qty: 30 | Fill #2

## 2019-01-19 MED FILL — GABAPENTIN 300 MG CAPSULE: 300 | 30 days supply | Qty: 120 | Fill #0

## 2019-01-19 MED FILL — METHOCARBAMOL 750 MG TABS: 750 | 30 days supply | Qty: 30 | Fill #4

## 2019-01-19 MED FILL — ?AMLODIPINE BESYLATE 10 MG: 10 | 30 days supply | Qty: 30 | Fill #3

## 2019-01-19 MED FILL — ?ESOMEPRAZOLE MAG DR 20MG C: 20 | 30 days supply | Qty: 60 | Fill #0

## 2019-01-19 MED FILL — ?DULoxetine HCL 60 MG CPEP: 60 | 30 days supply | Qty: 30 | Fill #3

## 2019-01-19 MED FILL — ALBUTEROL SUL 2.5 MG/3 ML S: (2.5 MG/3ML | 12 days supply | Qty: 150 | Fill #1

## 2019-02-22 ENCOUNTER — Other Ambulatory Visit: Payer: Self-pay | Admitting: Internal Medicine

## 2019-02-22 DIAGNOSIS — M5440 Lumbago with sciatica, unspecified side: Secondary | ICD-10-CM

## 2019-02-22 DIAGNOSIS — Z79899 Other long term (current) drug therapy: Secondary | ICD-10-CM

## 2019-02-22 DIAGNOSIS — G8929 Other chronic pain: Secondary | ICD-10-CM

## 2019-02-22 MED FILL — GABAPENTIN 300 MG CAPSULE: 300 | 30 days supply | Qty: 120 | Fill #0

## 2019-02-22 MED FILL — ?ATORVASTATIN 40MG TABLET: 40 | 30 days supply | Qty: 30 | Fill #2

## 2019-02-22 MED FILL — ?ESOMEPRAZOLE MAG DR 20MG C: 20 | 30 days supply | Qty: 60 | Fill #0

## 2019-02-22 MED FILL — $VENTOLIN HFA 18G INHALER: 108 (90 BAS | 25 days supply | Qty: 18 | Fill #2

## 2019-02-22 MED FILL — !ELIQUIS 5MG TABLET: 5 | 30 days supply | Qty: 60 | Fill #3

## 2019-02-22 MED FILL — METHOCARBAMOL 750 MG TABS: 750 | 30 days supply | Qty: 30 | Fill #4

## 2019-02-22 MED FILL — ALBUTEROL SUL 2.5 MG/3 ML S: (2.5 MG/3ML | 12 days supply | Qty: 150 | Fill #1

## 2019-02-22 MED FILL — ?DULoxetine HCL 60 MG CPEP: 60 | 30 days supply | Qty: 30 | Fill #3

## 2019-02-22 MED FILL — ?AMLODIPINE BESYLATE 10 MG: 10 | 30 days supply | Qty: 30 | Fill #3

## 2019-02-22 MED FILL — $Dulera 100 mcg Inhaler: 100-5 | 30 days supply | Qty: 13 | Fill #3

## 2019-02-28 ENCOUNTER — Ambulatory Visit: Payer: Self-pay | Admitting: Internal Medicine

## 2019-03-07 ENCOUNTER — Other Ambulatory Visit: Payer: Self-pay | Admitting: Internal Medicine

## 2019-03-07 DIAGNOSIS — Z79899 Other long term (current) drug therapy: Secondary | ICD-10-CM

## 2019-03-07 DIAGNOSIS — G8929 Other chronic pain: Secondary | ICD-10-CM

## 2019-03-07 MED FILL — $ELIQUIS 5 MG TABLET: 5 | 30 days supply | Qty: 60 | Fill #3

## 2019-03-07 MED FILL — $VENTOLIN HFA 18G INHALER: 108 (90 BAS | 25 days supply | Qty: 18 | Fill #2

## 2019-03-07 MED FILL — ALBUTEROL SUL 2.5 MG/3 ML S: (2.5 MG/3ML | 12 days supply | Qty: 150 | Fill #1

## 2019-03-07 MED FILL — ?ESOMEPRAZOLE MAG DR 20MG C: 20 | 30 days supply | Qty: 60 | Fill #0

## 2019-03-07 MED FILL — ?ATORVASTATIN 40MG TABLET: 40 | 30 days supply | Qty: 30 | Fill #2

## 2019-03-07 MED FILL — GABAPENTIN 300 MG CAPSULE: 300 | 30 days supply | Qty: 120 | Fill #0

## 2019-03-07 MED FILL — ?AMLODIPINE BESYLATE 10 MG: 10 | 30 days supply | Qty: 30 | Fill #3

## 2019-03-07 MED FILL — $Dulera 100 mcg Inhaler: 100-5 | 30 days supply | Qty: 13 | Fill #3

## 2019-03-07 MED FILL — METHOCARBAMOL 750 MG TABS: 750 | 30 days supply | Qty: 30 | Fill #4

## 2019-03-07 MED FILL — ?DULOXETINE HCL 60 MG CPEP: 60 | 30 days supply | Qty: 30 | Fill #3

## 2019-05-16 MED FILL — ?ATORVASTATIN 40MG TABLET: 40 | 30 days supply | Qty: 30 | Fill #3

## 2019-05-16 MED FILL — ?DULOXETINE HCL 60 MG CPEP: 60 | 30 days supply | Qty: 30 | Fill #4

## 2019-05-16 MED FILL — ?AMLODIPINE BESYLATE 10 MG: 10 | 30 days supply | Qty: 30 | Fill #4

## 2019-05-16 MED FILL — $ELIQUIS 5 MG TABLET: 5 | 30 days supply | Qty: 60 | Fill #4

## 2019-05-16 MED FILL — GABAPENTIN 300 MG CAPSULE: 300 | 30 days supply | Qty: 120 | Fill #1

## 2019-06-28 ENCOUNTER — Other Ambulatory Visit: Payer: Self-pay | Admitting: Internal Medicine

## 2019-06-28 DIAGNOSIS — G8929 Other chronic pain: Secondary | ICD-10-CM

## 2019-06-28 MED FILL — ?ESOMEPRAZOLE MAG DR 20MG C: 20 | 30 days supply | Qty: 60 | Fill #1

## 2019-06-28 MED FILL — $Dulera 100 mcg Inhaler: 100-5 | 30 days supply | Qty: 13 | Fill #4

## 2019-06-28 MED FILL — ?ATORVASTATIN 40MG TABL: 40 | 30 days supply | Qty: 30 | Fill #3

## 2019-06-28 MED FILL — $VENTOLIN HFA 18G INHALER: 108 (90 BAS | 25 days supply | Qty: 18 | Fill #3

## 2019-06-28 MED FILL — $ELIQUIS 5 MG TABLET: 5 | 30 days supply | Qty: 60 | Fill #4

## 2019-06-28 MED FILL — ?AMLODIPINE BESYL 10MG TABL: 10 | 30 days supply | Qty: 30 | Fill #4

## 2019-06-28 MED FILL — GABAPENTIN 300 MG CAPSULE: 300 | 30 days supply | Qty: 120 | Fill #1

## 2019-06-28 MED FILL — $CYMBALTA 60 MG CAPSULE: 60 | 30 days supply | Qty: 30 | Fill #4

## 2019-06-29 ENCOUNTER — Ambulatory Visit: Payer: Self-pay

## 2019-06-29 MED FILL — METHOCARBAMOL 750 MG TABS: 750 | 30 days supply | Qty: 30 | Fill #0

## 2020-01-13 ENCOUNTER — Emergency Department (HOSPITAL_COMMUNITY)
Admission: EM | Admit: 2020-01-13 | Discharge: 2020-01-14 | Disposition: A | Discharge status: Home / Self Care | Payer: Medicaid Other | Attending: Emergency Medicine | Admitting: Emergency Medicine

## 2020-01-13 ENCOUNTER — Emergency Department (HOSPITAL_COMMUNITY): Payer: Medicaid Other

## 2020-01-13 ENCOUNTER — Other Ambulatory Visit: Payer: Self-pay

## 2020-01-13 ENCOUNTER — Encounter (HOSPITAL_COMMUNITY): Payer: Self-pay | Admitting: Emergency Medicine

## 2020-01-13 DIAGNOSIS — Z7901 Long term (current) use of anticoagulants: Secondary | ICD-10-CM | POA: Insufficient documentation

## 2020-01-13 DIAGNOSIS — R6883 Chills (without fever): Secondary | ICD-10-CM | POA: Insufficient documentation

## 2020-01-13 DIAGNOSIS — J449 Chronic obstructive pulmonary disease, unspecified: Secondary | ICD-10-CM | POA: Diagnosis not present

## 2020-01-13 DIAGNOSIS — I951 Orthostatic hypotension: Secondary | ICD-10-CM | POA: Insufficient documentation

## 2020-01-13 DIAGNOSIS — R0789 Other chest pain: Secondary | ICD-10-CM | POA: Insufficient documentation

## 2020-01-13 DIAGNOSIS — I2510 Atherosclerotic heart disease of native coronary artery without angina pectoris: Secondary | ICD-10-CM | POA: Insufficient documentation

## 2020-01-13 DIAGNOSIS — R0602 Shortness of breath: Secondary | ICD-10-CM | POA: Insufficient documentation

## 2020-01-13 DIAGNOSIS — R05 Cough: Secondary | ICD-10-CM | POA: Insufficient documentation

## 2020-01-13 DIAGNOSIS — Z20822 Contact with and (suspected) exposure to covid-19: Secondary | ICD-10-CM | POA: Diagnosis not present

## 2020-01-13 DIAGNOSIS — I10 Essential (primary) hypertension: Secondary | ICD-10-CM | POA: Diagnosis not present

## 2020-01-13 DIAGNOSIS — Z87891 Personal history of nicotine dependence: Secondary | ICD-10-CM | POA: Insufficient documentation

## 2020-01-13 DIAGNOSIS — Z79899 Other long term (current) drug therapy: Secondary | ICD-10-CM | POA: Insufficient documentation

## 2020-01-13 LAB — BASIC METABOLIC PANEL
Anion gap: 10 (ref 5–15)
BUN: 9 mg/dL (ref 6–20)
CO2: 26 mmol/L (ref 22–32)
Calcium: 9.5 mg/dL (ref 8.9–10.3)
Chloride: 104 mmol/L (ref 98–111)
Creatinine, Ser: 1.18 mg/dL (ref 0.61–1.24)
GFR calc Af Amer: >60 mL/min (ref >60)
GFR calc non Af Amer: >60 mL/min (ref >60)
Glucose, Bld: 83 mg/dL (ref 70–99)
Potassium: 3.2 mmol/L — ABNORMAL LOW (ref 3.5–5.1)
Sodium: 140 mmol/L (ref 135–145)

## 2020-01-13 LAB — CBC
HCT: 44.1 % (ref 39.0–52.0)
Hemoglobin: 14.6 g/dL (ref 13.0–17.0)
MCH: 31.7 pg (ref 26.0–34.0)
MCHC: 33.1 g/dL (ref 30.0–36.0)
MCV: 95.9 fL (ref 80.0–100.0)
Platelets: 336 10*3/uL (ref 150–400)
RBC: 4.6 MIL/uL (ref 4.22–5.81)
RDW: 13.1 % (ref 11.5–15.5)
WBC: 6.3 10*3/uL (ref 4.0–10.5)
nRBC: 0 % (ref 0.0–0.2)

## 2020-01-13 LAB — TROPONIN I (HIGH SENSITIVITY): Troponin I (High Sensitivity): 4 ng/L (ref <18)

## 2020-01-13 LAB — PROTIME-INR
INR: 0.9 (ref 0.8–1.2)
Prothrombin Time: 12.2 seconds (ref 11.4–15.2)

## 2020-01-13 NOTE — ED Triage Notes (Signed)
Patient reports left chest pain with SOB onset yesterday , denies fever or cough , no emesis or diaphoresis . Cocaine yesterday , pain increases with deep inspiration.

## 2020-01-14 ENCOUNTER — Emergency Department (HOSPITAL_COMMUNITY): Payer: Medicaid Other

## 2020-01-14 LAB — TROPONIN I (HIGH SENSITIVITY): Troponin I (High Sensitivity): 5 ng/L (ref <18)

## 2020-01-14 LAB — SARS CORONAVIRUS 2 BY RT PCR (HOSPITAL ORDER, PERFORMED IN ~~LOC~~ HOSPITAL LAB): SARS Coronavirus 2: NEGATIVE

## 2020-01-14 MED ORDER — IOHEXOL 350 MG/ML SOLN
75.0000 mL | Freq: Once | INTRAVENOUS | Status: AC | PRN
  Administered 2020-01-14: 75 mL via INTRAVENOUS

## 2020-01-14 MED ORDER — SODIUM CHLORIDE 0.9 % IV BOLUS
500.0000 mL | Freq: Once | INTRAVENOUS | Status: AC
  Administered 2020-01-14: 500 mL via INTRAVENOUS

## 2020-01-14 MED ORDER — LIDOCAINE 5 % EX PTCH
1.0000 | MEDICATED_PATCH | CUTANEOUS | Status: DC
  Administered 2020-01-14: 1 via TRANSDERMAL
  Filled 2020-01-14: qty 1

## 2020-01-14 MED ORDER — KETOROLAC TROMETHAMINE 15 MG/ML IJ SOLN
15.0000 mg | Freq: Once | INTRAMUSCULAR | Status: AC
  Administered 2020-01-14: 15 mg via INTRAVENOUS
  Filled 2020-01-14: qty 1

## 2020-01-14 NOTE — ED Notes (Addendum)
Pt given bus pass, dc papers- instructed to re-establish cardiologist and continue taking eliquis

## 2020-01-14 NOTE — ED Notes (Signed)
Pt given bus pass ?

## 2020-01-14 NOTE — ED Provider Notes (Signed)
Patient seen/examined in the Emergency Department in conjunction with Advanced Practice Provider McDonald Patient reports chest pain/SOB Exam : awake/alert, appears uncomfortable.  Repeat blood pressure 104 SBP Plan: pt poor historian but does have h/o PE, will proceed with CT chest    Ripley Fraise, MD 01/14/20 [phone]

## 2020-01-14 NOTE — ED Provider Notes (Signed)
[facility] EMERGENCY DEPARTMENT Provider Note   CSN: [id number] Arrival date & time: 01/13/20  2019     History Chief Complaint  Patient presents with  . Chest Pain    Jeremy Booker is a 54 y.o. male with a history of PE on Eliquis, COPD, cocaine use disorder, marijuana use disorder, alcohol use disorder, CAD, GI bleed, HTN, RBBB, tubular adenoma s/p right hemicolectomy in 2017 who presents to the emergency department with a chief complaint of chest pain.  The patient endorses left-sided, nonradiating, constant chest pain that has been waxing and waning in severity that began yesterday. Pain is characterized as pressure and squeezing. It is pleuritic. He has had associated progressively worsening shortness of breath since yesterday as well as a nonproductive cough and chills over the last few days. He has had a little bit of lightheadedness with standing today. No fever, nausea, vomiting, diarrhea, abdominal pain, back pain, numbness, weakness, dizziness, headache, loss of sense of taste or smell, melena, hematochezia, hematemesis. No treatment prior to arrival. States he has had similar chest pain in the past, but cannot recall what he was diagnosed with when he had the pain. He has not currently established with a cardiologist.  He has a history of PE and is on Eliquis. He has not taken his medication for the last few days. He is unable to state why he has not taken the medication, but states he does not need a refill at this time. He has a history of cocaine use, which he last used yesterday..   The history is provided by the patient and medical records. No language interpreter was used.       Past Medical History:  Diagnosis Date  . Anxiety   . Atrial fibrillation ([place])    a. Dx 01/2016 in setting of PE, cocaine.  . Bicuspid aortic valve   . CAD (coronary artery disease)   . Chest pain    a. 2010 s/p cath in [place], [place] - reportedly nl.  . Chronic chest pain    . Cocaine abuse ([place])   . Congenital heart disease    a. thinks he had "a hole in my heart" s/p surgical correction @ age 32 and then age 80. Per notes, suspected prior aortic interruption and had repair.  Marland Kitchen COPD (chronic obstructive pulmonary disease) ([place])   . Depression   . Drug abuse ([place])    a. 06/2014 Cocaine/Marijuana - last used a few months ago.  Marland Kitchen ETOH abuse    a. 06/2014 currently a few bottles of wine/week.  . Hypertension       . Pulmonary embolism ([place])   . RBBB   . 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 R lung 06/2014. b. multiple RLL PEs 01/2016.  . Tobacco abuse    a. 30 pack years. Quit   . Tubular adenoma    a. s/p right hemicolectomy 01/2016, neg path for malignancy, positive for ischemia.    Patient Active Problem List   Diagnosis Date Noted  . Idiopathic hypotension 03/31/2018  . OSA on CPAP 03/31/2018  . Pleuritic chest pain 11/25/2017  . Obstructive sleep apnea 01/06/2017  . Esophagitis, unspecified 10/08/2016  . Lower GI bleed 05/22/2016  . S/P right hemicolectomy 02/20/2016  . Atrial fibrillation ([place]) 02/18/2016  . Current smoker 06/18/2015  . Left anterior knee pain 05/04/2015  . RVH (right ventricular hypertrophy) 03/08/2015  . Chronic low back pain 01/26/2015  . History of pulmonary embolism  01/11/2015  . Dyspnea 12/04/2014  . Solitary pulmonary nodule 07/20/2014  . Pulmonary embolus ([place]) 07/03/2014  . 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 Drug abuse in remission ([place])   . Hypertension   . COPD (chronic obstructive pulmonary disease) ([place])   . Congenital heart disease   . Gastro-esophageal reflux disease without esophagitis 10/16/2012    Past Surgical History:  Procedure Laterality Date  . APPENDECTOMY    . CARDIAC SURGERY     congenital   . TONSILLECTOMY         Family History  Problem Relation Age of Onset  . Other Father        died in late 34's - ? cause  . Hypertension Mother         alive  . Clotting disorder Mother   . Hypertension Maternal Uncle   . Heart disease Maternal Uncle   . Heart disease Sister   . Cancer Brother        type unknown  . Cancer Maternal Uncle        x 3, type unknown  . Heart attack Neg Hx   . Stroke Neg Hx     Social History   Tobacco Use  . Smoking status: Former Smoker    Packs/day: 0.00    Years: 30.00    Pack years: 0.00    Types: Cigarettes    Quit date: 09/29/2015    Years since quitting: 4.2  . Smokeless tobacco: Never Used  Vaping Use  . Vaping Use: Never used  Substance Use Topics  . Alcohol use: Yes  . Drug use: No    Types: Cocaine, Marijuana    Home Medications Prior to Admission medications   Medication Sig Start Date End Date Taking? Authorizing Provider  albuterol (PROVENTIL) (2.5 MG/3ML) 0.083% nebulizer solution TAKE 3 MLS BY NEBULIZATION EVERY 6 HOURS AS NEEDED FOR WHEEZING OR SHORTNESS OF BREATH. 11/26/18   Ladell Pier, MD  albuterol (VENTOLIN HFA) 108 (90 Base) MCG/ACT inhaler INHALE 2 PUFFS INTO THE LUNGS EVERY 6 HOURS AS NEEDED FOR WHEEZING OR SHORTNESS OF BREATH. 08/24/18   Ladell Pier, MD  amLODipine (NORVASC) 10 MG tablet Take 1 tablet (10 mg total) by mouth daily. 08/24/18   Ladell Pier, MD  apixaban (ELIQUIS) 5 MG TABS tablet Take 1 tablet (5 mg total) by mouth 2 (two) times daily. 08/24/18   Ladell Pier, MD  atorvastatin (LIPITOR) 40 MG tablet Take 1 tablet (40 mg total) by mouth daily. 08/24/18 08/24/19  Ladell Pier, MD  diclofenac sodium (VOLTAREN) 1 % GEL Apply 4 g topically 4 (four) times daily. 11/25/17   Ladell Pier, MD  DULoxetine (CYMBALTA) 60 MG capsule Take 1 capsule (60 mg total) by mouth daily. 08/24/18   Ladell Pier, MD  esomeprazole (NEXIUM) 20 MG capsule Take 1 capsule (20 mg total) by mouth 2 (two) times daily before a meal. 11/26/18   Ladell Pier, MD  gabapentin (NEURONTIN) 300 MG capsule TAKE 2 CAPSULES BY MOUTH 2 TIMES DAILY.  11/28/18   Ladell Pier, MD  methocarbamol (ROBAXIN) 750 MG tablet TAKE 1 TABLET (750 MG TOTAL) BY MOUTH AT BEDTIME AS NEEDED FOR MUSCLE SPASMS. 06/29/19   Argentina Donovan, PA-C  Misc. Devices [facility]) MISC Use as indicated 08/24/18   Ladell Pier, MD  mometasone-formoterol [facility]) 100-5 MCG/ACT AERO Inhale 2 puffs into the lungs 2 (two) times daily. 08/24/18  Ladell Pier, MD  nitroGLYCERIN (NITROSTAT) 0.4 MG SL tablet Place 1 tablet (0.4 mg total) under the tongue every 5 (five) minutes as needed for chest pain. Daily max- 3 doses 11/25/17   Ladell Pier, MD  predniSONE (DELTASONE) 20 MG tablet Take 2 tablets by mouth daily for 5 days. 09/27/18   Ladell Pier, MD  traMADol (ULTRAM) 50 MG tablet TAKE 1 TABLET (50 MG TOTAL) BY MOUTH 2 TIMES DAILY AS NEEDED. 11/16/18   Ladell Pier, MD    Allergies    Buprenorphine hcl, Gadolinium derivatives, Morphine and related, Baclofen, Dilaudid [hydromorphone hcl], Morphine, and Naprosyn [naproxen]  Review of Systems   Review of Systems  Constitutional: Positive for chills. Negative for appetite change, diaphoresis and fever.  HENT: Negative for congestion.   Eyes: Negative for visual disturbance.  Respiratory: Positive for cough and shortness of breath.   Cardiovascular: Positive for chest pain. Negative for palpitations and leg swelling.  Gastrointestinal: Negative for abdominal pain, constipation, diarrhea, nausea and vomiting.  Genitourinary: Negative for dysuria and flank pain.  Musculoskeletal: Negative for back pain, myalgias, neck pain and neck stiffness.  Skin: Negative for rash.  Allergic/Immunologic: Negative for immunocompromised state.  Neurological: Negative for seizures, syncope, weakness, numbness and headaches.  Psychiatric/Behavioral: Negative for confusion.    Physical Exam Updated Vital Signs BP 108/90 (BP Location: Left Arm)   Pulse 70   Temp 98.3 F (36.8 C) (Oral)   Resp 18   Ht 5\' 11"   (1.803 m)   Wt 83 kg   SpO2 98%   BMI 25.52 kg/m   Physical Exam Vitals and nursing note reviewed.  Constitutional:      General: He is not in acute distress.    Appearance: He is well-developed. He is not ill-appearing, toxic-appearing or diaphoretic.     Comments: Clutching his left chest and yelling out him pain.  Pain appears to be distractible as patient is able to have a conversation with myself and nursing staff without difficulty.   HENT:     Head: Normocephalic.  Eyes:     Conjunctiva/sclera: Conjunctivae normal.  Cardiovascular:     Rate and Rhythm: Normal rate and regular rhythm.     Pulses: Normal pulses.     Heart sounds: No murmur heard.  No friction rub. No gallop.   Pulmonary:     Effort: Pulmonary effort is normal. No respiratory distress.     Breath sounds: No stridor. No wheezing, rhonchi or rales.     Comments: Producible tenderness palpation to the left chest wall.  No crepitus noticed or step-offs. Chest:     Chest wall: Tenderness present.  Abdominal:     General: There is no distension.     Palpations: Abdomen is soft. There is no mass.     Tenderness: There is no abdominal tenderness. There is no right CVA tenderness, left CVA tenderness, guarding or rebound.     Hernia: No hernia is present.  Musculoskeletal:     Cervical back: Neck supple.     Right lower leg: No edema.     Left lower leg: No edema.  Skin:    General: Skin is warm and dry.  Neurological:     Mental Status: He is alert.  Psychiatric:        Behavior: Behavior normal.     ED Results / Procedures / Treatments   Labs (all labs ordered are listed, but only abnormal results are displayed) Labs Reviewed  BASIC  METABOLIC PANEL - Abnormal; Notable for the following components:      Result Value   Potassium 3.2 (*)    All other components within normal limits  SARS CORONAVIRUS 2 BY RT PCR (HOSPITAL ORDER, [place] [facility])  CBC  PROTIME-INR  TROPONIN I  (HIGH SENSITIVITY)  TROPONIN I (HIGH SENSITIVITY)    EKG EKG Interpretation  Date/Time:  Friday January 13 2020 23:50:37 EDT Ventricular Rate:  85 PR Interval:  178 QRS Duration: 152 QT Interval:  420 QTC Calculation: 499 R Axis:   -91 Text Interpretation: Normal sinus rhythm Right atrial enlargement Right bundle branch block Anterior infarct , age undetermined Abnormal ECG Confirmed by Ripley Fraise [phone]) on 01/13/2020 11:55:25 PM   Radiology DG Chest 2 View  Result Date: 01/13/2020 CLINICAL DATA:  Chest pain EXAM: CHEST - 2 VIEW COMPARISON:  09/06/2019 FINDINGS: Prior median sternotomy. Heart is borderline in size. Scarring in the lung bases. No acute airspace opacities or effusions. No acute bony abnormality. IMPRESSION: No active disease. Electronically Signed   By: Rolm Baptise M.D.   On: 01/13/2020 21:19   CT Angio Chest PE W and/or Wo Contrast  Result Date: 01/14/2020 CLINICAL DATA:  Chest pain and shortness of breath EXAM: CT ANGIOGRAPHY CHEST WITH CONTRAST TECHNIQUE: Multidetector CT imaging of the chest was performed using the standard protocol during bolus administration of intravenous contrast. Multiplanar CT image reconstructions and MIPs were obtained to evaluate the vascular anatomy. CONTRAST:  6mL OMNIPAQUE IOHEXOL 350 MG/ML SOLN COMPARISON:  November 21, 2018 FINDINGS: Cardiovascular: There is a optimal opacification of the pulmonary arteries. There is no central,segmental, or subsegmental filling defects within the pulmonary arteries. There is unchanged moderate cardiomegaly present. No pericardial effusion or thickening. No evidence right heart strain. There is normal three-vessel brachiocephalic anatomy without proximal stenosis. The aorta is normal in appearance. There is scattered atherosclerosis at the aortic valve. Mediastinum/Nodes: No hilar, mediastinal, or axillary adenopathy. Thyroid gland, trachea, and esophagus demonstrate no significant findings. Lungs/Pleura:  Centrilobular emphysematous changes are seen at both lung apices. Again noted are areas of architectural distortion and scarring at the right lung base. No new airspace consolidation or pleural effusion is seen. Upper Abdomen: No acute abnormalities present in the visualized portions of the upper abdomen. Musculoskeletal: No chest wall abnormality. No acute or significant osseous findings. Review of the MIP images confirms the above findings. Overlying median sternotomy wires are present. IMPRESSION: No central, segmental, or subsegmental pulmonary embolism. Unchanged moderate cardiomegaly Emphysema (ICD10-J43.9). Areas of architectural distortion and scarring at both lung apices. Electronically Signed   By: Prudencio Pair M.D.   On: 01/14/2020 01:19    Procedures Procedures (including critical care time)  Medications Ordered in ED Medications  lidocaine (LIDODERM) 5 % 1 patch (1 patch Transdermal Patch Applied 01/14/20 0327)  iohexol (OMNIPAQUE) 350 MG/ML injection 75 mL (75 mLs Intravenous Contrast Given 01/14/20 0059)  sodium chloride 0.9 % bolus 500 mL (0 mLs Intravenous Stopped 01/14/20 0725)  ketorolac (TORADOL) 15 MG/ML injection 15 mg (15 mg Intravenous Given 01/14/20 0327)    ED Course  I have reviewed the triage vital signs and the nursing notes.  Pertinent labs & imaging results that were available during my care of the patient were reviewed by me and considered in my medical decision making (see chart for details).    MDM Rules/Calculators/A&P  54 year old male with a history of PE on Eliquis, COPD, cocaine use disorder, marijuana use disorder, alcohol use disorder, CAD, GI bleed, HTN, RBBB, tubular adenoma s/p right hemicolectomy in 2017 presenting with left-sided chest pain, shortness of breath, cough, and chills. Patient was seen and independently evaluated by Dr. Christy Gentles, attending physician.  Patient was initially normotensive on arrival to the ER. However, he  progressively became hypotensive while in the waiting room. Noted to be 70/40 prior to being roomed.   EKG with normal sinus rhythm and unchanged from previous. Chest x-ray has been reviewed by me and is unremarkable. Delta troponin is not elevated. No electrolyte derangements aside from very mild hypokalemia. COVID-19 test is negative.  Patient does report that he has missed his last 3 days of Eliquis. Given chest pain or shortness of breath, CT PE study was obtained and was negative.   Per chart review, patient had a very similar ER visit on April 27 with an admission for cardiac echo. Work-up was reassuring and patient improved with lidocaine patch and Toradol.  Patient did not have orthostatic hypotension. He was treated with IV fluids and hypotension resolved. Patient did note that he last used cocaine 3 days ago, and I suspect his symptoms could be related to coronary vasospasm versus musculoskeletal etiology. Doubt ACS.   Following lidocaine and Toradol administration, patient slept in the ER for several hours, and when he awoke his pain had resolved. He reports that he was feeling much better and would like to go home. He was ambulated through the department by me without dizziness or lightheadedness. His blood pressure was noted to be 130s over 80s after ambulating him. Will refer the patient to cardiology as he does not have a current cardiologist. He was encouraged to resume his home Eliquis. All questions answered. He is hemodynamically stable to no acute distress. Safe for discharge to home with outpatient follow-up as indicated.  Final Clinical Impression(s) / ED Diagnoses Final diagnoses:  Atypical chest pain  Orthostatic hypotension    Rx / DC Orders ED Discharge Orders    None       Joanne Gavel, PA-C 01/14/20 1057    Ripley Fraise, MD 01/14/20 2333

## 2020-01-14 NOTE — Discharge Instructions (Signed)
Thank you for allowing me to care for you today in the Emergency Department.   Please restart your Eliquis.  Try not to miss any doses.  You should stop using cocaine.  Return to the emergency department if you develop chest pain with exertion, sweating, if you pass out, if your fingers or lips turn blue, if you develop significant trouble breathing, uncontrollable vomiting, or other new, concerning symptoms.

## 2020-01-24 ENCOUNTER — Institutional Professional Consult (permissible substitution): Payer: Medicaid Other | Admitting: Cardiology

## 2020-01-30 NOTE — Progress Notes (Deleted)
Electrophysiology Office Note:    Date:  01/30/2020   ID:  Jeremy Booker, DOB Aug 20, 1965, MRN [id number]  PCP:  Jeremy Pier, MD  [place] Cardiologist:  No primary care provider on file.  [facility] Electrophysiologist:  None   Referring MD: Jeremy Pier, MD   Chief Complaint: Chest pain  History of Present Illness:    Jeremy Booker is a 54 y.o. male who presents for an evaluation of chest pain at the request of Dr Wynetta Emery. Their medical history includes polysubstance abuse, atrial fibrillation on apixaban.   Past Medical History:  Diagnosis Date  . Anxiety   . Atrial fibrillation ([place])    a. Dx 01/2016 in setting of PE, cocaine.  . Bicuspid aortic valve   . CAD (coronary artery disease)   . Chest pain    a. 2010 s/p cath in [place], [place] - reportedly nl.  . Chronic chest pain   . Cocaine abuse ([place])   . Congenital heart disease    a. thinks he had "a hole in my heart" s/p surgical correction @ age 64 and then age 80. Per notes, suspected prior aortic interruption and had repair.  Marland Kitchen COPD (chronic obstructive pulmonary disease) ([place])   . Depression   . Drug abuse ([place])    a. 06/2014 Cocaine/Marijuana - last used a few months ago.  Marland Kitchen ETOH abuse    a. 06/2014 currently a few bottles of wine/week.  . Hypertension       . Pulmonary embolism ([place])   . RBBB   . 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 R lung 06/2014. b. multiple RLL PEs 01/2016.  . Tobacco abuse    a. 30 pack years. Quit   . Tubular adenoma    a. s/p right hemicolectomy 01/2016, neg path for malignancy, positive for ischemia.    Past Surgical History:  Procedure Laterality Date  . APPENDECTOMY    . CARDIAC SURGERY     congenital   . TONSILLECTOMY      Current Medications: No outpatient medications have been marked as taking for the 01/31/20 encounter (Appointment) with Vickie Epley, MD.     Allergies:   Buprenorphine hcl, Gadolinium derivatives, Morphine and related, Baclofen,  Dilaudid [hydromorphone hcl], Morphine, and Naprosyn [naproxen]   Social History   Socioeconomic History  . Marital status: Single    Spouse name: Not on file  . Number of children: 3  . Years of education: Not on file  . Highest education level: Not on file  Occupational History  . Occupation: none  Tobacco Use  . Smoking status: Former Smoker    Packs/day: 0.00    Years: 30.00    Pack years: 0.00    Types: Cigarettes    Quit date: 09/29/2015    Years since quitting: 4.3  . Smokeless tobacco: Never Used  Vaping Use  . Vaping Use: Never used  Substance and Sexual Activity  . Alcohol use: Yes  . Drug use: No    Types: Cocaine, Marijuana  . Sexual activity: Not on file  Other Topics Concern  . Not on file  Social History Narrative   Was living in [place].  Now living with girlfriend in [place].  Unemployed and possibly pending disability.   Social Determinants of Health   Financial Resource Strain:   . Difficulty of Paying Living Expenses: Not on file  Food Insecurity:   . Worried About Charity fundraiser in the Last  Year: Not on file  . Ran Out of Food in the Last Year: Not on file  Transportation Needs:   . Lack of Transportation (Medical): Not on file  . Lack of Transportation (Non-Medical): Not on file  Physical Activity:   . Days of Exercise per Week: Not on file  . Minutes of Exercise per Session: Not on file  Stress:   . Feeling of Stress : Not on file  Social Connections:   . Frequency of Communication with Friends and Family: Not on file  . Frequency of Social Gatherings with Friends and Family: Not on file  . Attends Religious Services: Not on file  . Active Member of Clubs or Organizations: Not on file  . Attends Archivist Meetings: Not on file  . Marital Status: Not on file     Family History: The patient's family history includes Cancer in his brother and maternal uncle; Clotting disorder in his mother; Heart disease in his maternal uncle  and sister; Hypertension in his maternal uncle and mother; Other in his father. There is no history of Heart attack or Stroke.  ROS:   Please see the history of present illness.    All other systems reviewed and are negative.  EKGs/Labs/Other Studies Reviewed:    The following studies were reviewed today: Echo  11/21/20216 Echo EF normal. Bicuspid aortic valve Trivial MR   EKG:  The ekg ordered today demonstrates ***  Recent Labs: 01/13/2020: BUN 9; Creatinine, Ser 1.18; Hemoglobin 14.6; Platelets 336; Potassium 3.2; Sodium 140  Recent Lipid Panel    Component Value Date/Time   CHOL 192 07/03/2014 0130   TRIG 149 07/03/2014 0130   HDL 51 07/03/2014 0130   CHOLHDL 3.8 07/03/2014 0130   VLDL 30 07/03/2014 0130   LDLCALC 111 (H) 07/03/2014 0130    Physical Exam:    VS:  There were no vitals taken for this visit.    Wt Readings from Last 3 Encounters:  01/13/20 182 lb 15.7 oz (83 kg)  11/26/18 175 lb 9.6 oz (79.7 kg)  08/24/18 181 lb (82.1 kg)     GEN: *** Well nourished, well developed in no acute distress HEENT: Normal NECK: No JVD; No carotid bruits LYMPHATICS: No lymphadenopathy CARDIAC: ***RRR, no murmurs, rubs, gallops RESPIRATORY:  Clear to auscultation without rales, wheezing or rhonchi  ABDOMEN: Soft, non-tender, non-distended MUSCULOSKELETAL:  No edema; No deformity  SKIN: Warm and dry NEUROLOGIC:  Alert and oriented x 3 PSYCHIATRIC:  Normal affect   ASSESSMENT:    No diagnosis found. PLAN:    In order of problems listed above:  1. Chest pain   2. Bicuspid aortic valve      Medication Adjustments/Labs and Tests Ordered: Current medicines are reviewed at length with the patient today.  Concerns regarding medicines are outlined above.  No orders of the defined types were placed in this encounter.  No orders of the defined types were placed in this encounter.    Signed, Lars Mage, MD, [facility]  01/30/2020 8:23 PM     Electrophysiology [place]

## 2020-01-31 ENCOUNTER — Institutional Professional Consult (permissible substitution): Payer: Medicaid Other | Admitting: Cardiology

## 2020-02-09 ENCOUNTER — Encounter: Payer: Self-pay | Admitting: General Practice

## 2020-06-25 ENCOUNTER — Telehealth: Payer: Self-pay

## 2020-06-25 NOTE — Telephone Encounter (Signed)
Transition Care Management Unsuccessful Follow-up Telephone Call  Date of discharge and from where:  06/22/20 from [facility]  Attempts:  1st Attempt  Reason for unsuccessful TCM follow-up call:  Unable to leave message

## 2020-06-26 NOTE — Telephone Encounter (Signed)
Transition Care Management Unsuccessful Follow-up Telephone Call  Date of discharge and from where:  06/22/20 from [facility]  Attempts:  2nd Attempt  Reason for unsuccessful TCM follow-up call:  Unable to reach patient

## 2020-06-27 NOTE — Telephone Encounter (Signed)
Transition Care Management Unsuccessful Follow-up Telephone Call  Date of discharge and from where:  06/22/2020 from [facility]  Attempts:  3rd Attempt  Reason for unsuccessful TCM follow-up call:  Unable to reach patient

## 2020-06-29 ENCOUNTER — Other Ambulatory Visit: Payer: Self-pay | Admitting: Pharmacy Technician

## 2020-12-04 ENCOUNTER — Other Ambulatory Visit: Payer: Self-pay

## 2021-01-30 ENCOUNTER — Other Ambulatory Visit: Payer: Self-pay | Admitting: Pharmacy Technician

## 2021-06-13 ENCOUNTER — Emergency Department (HOSPITAL_COMMUNITY)
Admission: EM | Admit: 2021-06-13 | Discharge: 2021-06-13 | Disposition: A | Discharge status: Home / Self Care | Payer: Medicaid Other | Attending: Emergency Medicine | Admitting: Emergency Medicine

## 2021-06-13 ENCOUNTER — Other Ambulatory Visit: Payer: Self-pay

## 2021-06-13 ENCOUNTER — Emergency Department (HOSPITAL_COMMUNITY): Payer: Medicaid Other

## 2021-06-13 ENCOUNTER — Encounter (HOSPITAL_COMMUNITY): Payer: Self-pay

## 2021-06-13 DIAGNOSIS — S3992XA Unspecified injury of lower back, initial encounter: Secondary | ICD-10-CM | POA: Diagnosis present

## 2021-06-13 DIAGNOSIS — G8929 Other chronic pain: Secondary | ICD-10-CM | POA: Insufficient documentation

## 2021-06-13 DIAGNOSIS — Z87891 Personal history of nicotine dependence: Secondary | ICD-10-CM | POA: Insufficient documentation

## 2021-06-13 DIAGNOSIS — S39012A Strain of muscle, fascia and tendon of lower back, initial encounter: Secondary | ICD-10-CM | POA: Diagnosis not present

## 2021-06-13 DIAGNOSIS — X58XXXA Exposure to other specified factors, initial encounter: Secondary | ICD-10-CM | POA: Insufficient documentation

## 2021-06-13 DIAGNOSIS — I10 Essential (primary) hypertension: Secondary | ICD-10-CM | POA: Insufficient documentation

## 2021-06-13 DIAGNOSIS — R531 Weakness: Secondary | ICD-10-CM | POA: Diagnosis not present

## 2021-06-13 DIAGNOSIS — I2510 Atherosclerotic heart disease of native coronary artery without angina pectoris: Secondary | ICD-10-CM | POA: Diagnosis not present

## 2021-06-13 DIAGNOSIS — J449 Chronic obstructive pulmonary disease, unspecified: Secondary | ICD-10-CM | POA: Insufficient documentation

## 2021-06-13 DIAGNOSIS — M545 Low back pain, unspecified: Secondary | ICD-10-CM

## 2021-06-13 LAB — CBC WITH DIFFERENTIAL/PLATELET
Abs Immature Granulocytes: 0.04 10*3/uL (ref 0.00–0.07)
Basophils Absolute: 0 10*3/uL (ref 0.0–0.1)
Basophils Relative: 0 %
Eosinophils Absolute: 0.1 10*3/uL (ref 0.0–0.5)
Eosinophils Relative: 2 %
HCT: 42.1 % (ref 39.0–52.0)
Hemoglobin: 14.1 g/dL (ref 13.0–17.0)
Immature Granulocytes: 1 %
Lymphocytes Relative: 31 %
Lymphs Abs: 2 10*3/uL (ref 0.7–4.0)
MCH: 31.4 pg (ref 26.0–34.0)
MCHC: 33.5 g/dL (ref 30.0–36.0)
MCV: 93.8 fL (ref 80.0–100.0)
Monocytes Absolute: 1 10*3/uL (ref 0.1–1.0)
Monocytes Relative: 16 %
Neutro Abs: 3.1 10*3/uL (ref 1.7–7.7)
Neutrophils Relative %: 50 %
Platelets: 302 10*3/uL (ref 150–400)
RBC: 4.49 MIL/uL (ref 4.22–5.81)
RDW: 13.6 % (ref 11.5–15.5)
WBC: 6.3 10*3/uL (ref 4.0–10.5)
nRBC: 0 % (ref 0.0–0.2)

## 2021-06-13 LAB — COMPREHENSIVE METABOLIC PANEL
ALT: 96 U/L — ABNORMAL HIGH (ref 0–44)
AST: 110 U/L — ABNORMAL HIGH (ref 15–41)
Albumin: 3.4 g/dL — ABNORMAL LOW (ref 3.5–5.0)
Alkaline Phosphatase: 64 U/L (ref 38–126)
Anion gap: 7 (ref 5–15)
BUN: 12 mg/dL (ref 6–20)
CO2: 25 mmol/L (ref 22–32)
Calcium: 9.4 mg/dL (ref 8.9–10.3)
Chloride: 106 mmol/L (ref 98–111)
Creatinine, Ser: 0.78 mg/dL (ref 0.61–1.24)
GFR, Estimated: >60 mL/min (ref >60)
Glucose, Bld: 90 mg/dL (ref 70–99)
Potassium: 4.1 mmol/L (ref 3.5–5.1)
Sodium: 138 mmol/L (ref 135–145)
Total Bilirubin: 1 mg/dL (ref 0.3–1.2)
Total Protein: 6.2 g/dL — ABNORMAL LOW (ref 6.5–8.1)

## 2021-06-13 MED ORDER — CYCLOBENZAPRINE HCL 10 MG PO TABS: 10.0000 mg | ORAL_TABLET | Freq: Two times a day (BID) | ORAL | 0 refills | Status: DC | PRN

## 2021-06-13 MED ORDER — HYDROCODONE-ACETAMINOPHEN 5-325 MG PO TABS: 1.0000 | ORAL_TABLET | Freq: Once | ORAL | Status: DC

## 2021-06-13 MED ORDER — ORPHENADRINE CITRATE 30 MG/ML IJ SOLN
60.0000 mg | Freq: Once | INTRAMUSCULAR | Status: DC
  Filled 2021-06-13: qty 2

## 2021-06-13 MED ORDER — GABAPENTIN 300 MG PO CAPS: ORAL_CAPSULE | ORAL | 3 refills | Status: DC

## 2021-06-13 MED ORDER — CYCLOBENZAPRINE HCL 10 MG PO TABS
10.0000 mg | ORAL_TABLET | Freq: Once | ORAL | Status: AC
Start: 2021-06-13 — End: 2021-06-13
  Administered 2021-06-13: 10 mg via ORAL
  Filled 2021-06-13: qty 1

## 2021-06-13 MED ORDER — LORAZEPAM 1 MG PO TABS: 1.0000 mg | ORAL_TABLET | Freq: Once | ORAL | Status: DC | PRN

## 2021-06-13 MED ORDER — KETOROLAC TROMETHAMINE 60 MG/2ML IM SOLN
60.0000 mg | Freq: Once | INTRAMUSCULAR | Status: AC
  Administered 2021-06-13: 60 mg via INTRAMUSCULAR
  Filled 2021-06-13: qty 2

## 2021-06-13 NOTE — Discharge Instructions (Signed)
Please follow-up with neurosurgery for management of your back pain.  Please also take the Flexeril and gabapentin as prescribed.  Please use Tylenol and ibuprofen for pain as well.  Please follow-up with your primary care physician for prescriptions for narcotic pain medicines

## 2021-06-13 NOTE — ED Provider Triage Note (Signed)
Emergency Medicine Provider Triage Evaluation Note  Jeremy Booker , a 56 y.o. male  was evaluated in triage.  Pt complains of painful back spasms that started yesterday. He is currently in a drug rehab.    He has been with out his prednisone, tizanidine for two days. these were rx by [facility] per patient.   His pain is in the lower back.  He reports he is unable to walk due to the pain.   He had been previously offered surgery and refused and wants that now.   He reports when he has a spasm he can't feel his toes, but that is not currently present. Spasm is bilateral.   He denies any injection drug use.   Physical Exam  BP 126/81 (BP Location: Right Arm)    Pulse 73    Temp 97.8 F (36.6 C) (Oral)    Resp 18    Ht 5\' 10"  (1.778 m)    Wt 77.1 kg    SpO2 97%    BMI 24.39 kg/m  Gen:   Awake, appears uncomfortable, while speaking he starts breathing heavy and appears in pain, he said it was a spasm.  Resp:  Normal effort  MSK:   Moves extremities without difficulty  Other:    Medical Decision Making  Medically screening exam initiated at 1:00 PM.  Appropriate orders placed.  Jeremy Booker was informed that the remainder of the evaluation will be completed by another provider, this initial triage assessment does not replace that evaluation, and the importance of remaining in the ED until their evaluation is complete.     Lorin Glass, [place] 06/13/21 1306

## 2021-06-13 NOTE — ED Triage Notes (Signed)
Pt BIB [facility] from home c/o lower back pain and muscle spasms from a pinched nerve. Pt states he was suppose to have surgery awhile back but refused and now wants the surgery because the pain is too bad.

## 2021-06-13 NOTE — ED Provider Notes (Signed)
[facility] Emergency Department Provider Note MRN:  [id number]  Arrival date & time: 06/13/21     Chief Complaint   Back Pain   History of Present Illness   Jeremy Booker is a 56 y.o. year-old male with a history of Etoh abuse, Cocaine abuse, HTN, COPD, Jerrye Bushy, A fib presenting to the ED with chief complaint of back pain.  The patient is a resident at a drug rehab facility.  Presents today for evaluation of back pain.  States that he has had chronic back pain and takes a muscle relaxer however he has been out of the muscle relaxer for 2 days.  (Tizanidine) states that during that time he has had severe back pain.  Denies recent trauma.  Denies recent spinal manipulation.  Denies IV drug use.  Denies steroid use.  Reports that he was previously offered surgery and refused at that time but wants it now.  States that he did urinate on himself and feels weak.  Patient states that he was able to control his bowels.  Review of Systems  A thorough review of systems was obtained and all systems are negative except as noted in the HPI and PMH.   Patient's Health History    Past Medical History:  Diagnosis Date   Anxiety    Atrial fibrillation ([place])    a. Dx 01/2016 in setting of PE, cocaine.   Bicuspid aortic valve    CAD (coronary artery disease)    Chest pain    a. 2010 s/p cath in [place], [place] - reportedly nl.   Chronic chest pain    Cocaine abuse (HCC)    Congenital heart disease    a. thinks he had "a hole in my heart" s/p surgical correction @ age 32 and then age 36. Per notes, suspected prior aortic interruption and had repair.   COPD (chronic obstructive pulmonary disease) ([place])    Depression    Drug abuse ([place])    a. 06/2014 Cocaine/Marijuana - last used a few months ago.   ETOH abuse    a. 06/2014 currently a few bottles of wine/week.   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 R lung 06/2014. b.  multiple RLL PEs 01/2016.   Tobacco abuse    a. 30 pack years. Quit    Tubular adenoma    a. s/p right hemicolectomy 01/2016, neg path for malignancy, positive for ischemia.    Past Surgical History:  Procedure Laterality Date   APPENDECTOMY     CARDIAC SURGERY     congenital    TONSILLECTOMY      Family History  Problem Relation Age of Onset   Other Father        died in late 42's - ? cause   Hypertension Mother        alive   Clotting disorder Mother    Hypertension Maternal Uncle    Heart disease Maternal Uncle    Heart disease Sister    Cancer Brother        type unknown   Cancer Maternal Uncle        x 3, type unknown   Heart attack Neg Hx    Stroke Neg Hx     Social History   Socioeconomic History   Marital status: Single    Spouse name: Not on file   Number of children: 3  Years of education: Not on file   Highest education level: Not on file  Occupational History   Occupation: none  Tobacco Use   Smoking status: Former    Packs/day: 0.00    Years: 30.00    Pack years: 0.00    Types: Cigarettes    Quit date: 09/29/2015    Years since quitting: 5.7   Smokeless tobacco: Never  Vaping Use   Vaping Use: Never used  Substance and Sexual Activity   Alcohol use: Yes   Drug use: No    Types: Cocaine, Marijuana   Sexual activity: Not on file  Other Topics Concern   Not on file  Social History Narrative   Was living in [place].  Now living with girlfriend in [place].  Unemployed and possibly pending disability.   Social Determinants of Health   Financial Resource Strain: Not on file  Food Insecurity: Not on file  Transportation Needs: Not on file  Physical Activity: Not on file  Stress: Not on file  Social Connections: Not on file  Intimate Partner Violence: Not on file     Physical Exam   Physical Exam Vitals and nursing note reviewed.  Constitutional:      Appearance: Normal appearance.  Cardiovascular:     Rate and Rhythm: Normal rate and  regular rhythm.  Pulmonary:     Effort: Pulmonary effort is normal.     Breath sounds: Normal breath sounds.  Abdominal:     General: Abdomen is flat.     Palpations: Abdomen is soft.  Musculoskeletal:        General: Tenderness (Lumbar paraspinal) present. No deformity.     Cervical back: No tenderness.  Skin:    Capillary Refill: Capillary refill takes less than 2 seconds.  Neurological:     General: No focal deficit present.     Mental Status: He is alert.     Cranial Nerves: No cranial nerve deficit.     Sensory: No sensory deficit.     Motor: Weakness (4/5 bilateral lower extremity) present.     Gait: Gait normal.     Comments: Able to ambulate without difficulty after return from MRI      Diagnostic and Interventional Summary    Labs Reviewed  COMPREHENSIVE METABOLIC PANEL - Abnormal; Notable for the following components:      Result Value   Total Protein 6.2 (*)    Albumin 3.4 (*)    AST 110 (*)    ALT 96 (*)    All other components within normal limits  CBC WITH DIFFERENTIAL/PLATELET    MR THORACIC SPINE WO CONTRAST  Final Result    MR LUMBAR SPINE WO CONTRAST  Final Result      Medications  LORazepam (ATIVAN) tablet 1 mg (has no administration in time range)  ketorolac (TORADOL) injection 60 mg (60 mg Intramuscular Given 06/13/21 1543)  cyclobenzaprine (FLEXERIL) tablet 10 mg (10 mg Oral Given 06/13/21 1543)     Procedures  /  Critical Care Procedures  ED Course and Medical Decision Making  Initial Impression and Ddx 56 year old male presents for evaluation of back pain.  Differential diagnosis includes benign to the following: Lumbar strain, cauda equina syndrome, epidural hematoma  Most likely diagnosis at this time is lumbar strain however given that the patient had urinary incontinence I am concerned for cauda equina syndrome will obtain MRI.  Past medical/surgical history that increases complexity of ED encounter: History of malingering, drug abuse,  chronic back pain  Interpretation of Diagnostics I personally reviewed the Cardiac Monitor and my interpretation is as follows: Normal sinus rhythm on cardiac monitor.    MRI thoracic and lumbar was obtained did not reveal evidence of acute cauda equina syndrome.  Patient was supplied with Flexeril and toradol injection which partially relieved his symptoms.  Lab work-up obtained in triage was unremarkable.  Patient Reassessment and Ultimate Disposition/Management Given that the patient had no findings consistent with cauda equina syndrome and was able to ambulate feels if he is safe for discharge home.  We will send the patient home with a prescription for Flexeril and encouraged the patient to follow-up with his outpatient spine surgeon.  Patient is in agreement this plan.  Patient management required discussion with the following services or consulting groups:  None  Complexity of Problems Addressed Acute illness or injury that poses threat of life of bodily function  Additional Data Reviewed and Analyzed Further history obtained from: Prior ED visit notes and Recent PCP notes  Factors Impacting ED Encounter Risk Consideration of hospitalization    Final Clinical Impressions(s) / ED Diagnoses     ICD-10-CM   1. Strain of lumbar region, initial encounter  S39.012A     2. Chronic midline low back pain without sciatica  M54.50 gabapentin (NEURONTIN) 300 MG capsule   G89.29       ED Discharge Orders          Ordered    cyclobenzaprine (FLEXERIL) 10 MG tablet  2 times daily PRN        06/13/21 2039    gabapentin (NEURONTIN) 300 MG capsule        06/13/21 2039    Ambulatory referral to Neurosurgery       Comments: Spine Surgery   06/13/21 2039             Discharge Instructions Discussed with and Provided to Patient:     Discharge Instructions      Please follow-up with neurosurgery for management of your back pain.  Please also take the Flexeril and  gabapentin as prescribed.  Please use Tylenol and ibuprofen for pain as well.  Please follow-up with your primary care physician for prescriptions for narcotic pain medicines        Zachery Dakins, MD 06/13/21 2321    Drenda Freeze, MD 06/20/21 [phone]

## 2021-06-13 NOTE — ED Notes (Signed)
Patient transported to MRI 

## 2022-09-01 ENCOUNTER — Ambulatory Visit: Payer: Medicaid Other | Admitting: Physician Assistant

## 2022-09-01 ENCOUNTER — Encounter: Payer: Self-pay | Admitting: Physician Assistant

## 2022-09-01 VITALS — BP 128/85 | HR 83 | Ht 70.0 in | Wt 162.0 lb

## 2022-09-01 DIAGNOSIS — Q249 Congenital malformation of heart, unspecified: Secondary | ICD-10-CM

## 2022-09-01 DIAGNOSIS — Z86711 Personal history of pulmonary embolism: Secondary | ICD-10-CM

## 2022-09-01 DIAGNOSIS — K5900 Constipation, unspecified: Secondary | ICD-10-CM

## 2022-09-01 DIAGNOSIS — K219 Gastro-esophageal reflux disease without esophagitis: Secondary | ICD-10-CM

## 2022-09-01 DIAGNOSIS — J441 Chronic obstructive pulmonary disease with (acute) exacerbation: Secondary | ICD-10-CM

## 2022-09-01 DIAGNOSIS — J449 Chronic obstructive pulmonary disease, unspecified: Secondary | ICD-10-CM

## 2022-09-01 DIAGNOSIS — M5441 Lumbago with sciatica, right side: Secondary | ICD-10-CM

## 2022-09-01 DIAGNOSIS — M545 Other chronic pain: Secondary | ICD-10-CM

## 2022-09-01 DIAGNOSIS — G8929 Other chronic pain: Secondary | ICD-10-CM

## 2022-09-01 DIAGNOSIS — F5104 Psychophysiologic insomnia: Secondary | ICD-10-CM

## 2022-09-01 DIAGNOSIS — F1010 Alcohol abuse, uncomplicated: Secondary | ICD-10-CM

## 2022-09-01 DIAGNOSIS — F1410 Cocaine abuse, uncomplicated: Secondary | ICD-10-CM | POA: Diagnosis not present

## 2022-09-01 DIAGNOSIS — F1210 Cannabis abuse, uncomplicated: Secondary | ICD-10-CM

## 2022-09-01 DIAGNOSIS — I10 Essential (primary) hypertension: Secondary | ICD-10-CM

## 2022-09-01 DIAGNOSIS — R12 Heartburn: Secondary | ICD-10-CM

## 2022-09-01 MED ORDER — HYDROXYZINE HCL 25 MG PO TABS
25.0000 mg | ORAL_TABLET | Freq: Every evening | ORAL | 1 refills | Status: AC | PRN
Start: 2022-09-01 — End: ?

## 2022-09-01 MED ORDER — AMLODIPINE BESYLATE 10 MG PO TABS
10.0000 mg | ORAL_TABLET | Freq: Every day | ORAL | 1 refills | Status: AC
Start: 2022-09-01 — End: ?

## 2022-09-01 MED ORDER — APIXABAN 5 MG PO TABS
5.0000 mg | ORAL_TABLET | Freq: Two times a day (BID) | ORAL | 1 refills | Status: AC
Start: 2022-09-01 — End: ?

## 2022-09-01 MED ORDER — DULERA 100-5 MCG/ACT IN AERO
2.0000 | INHALATION_SPRAY | Freq: Two times a day (BID) | RESPIRATORY_TRACT | 1 refills | Status: AC
Start: 2022-09-01 — End: ?

## 2022-09-01 MED ORDER — DOCUSATE SODIUM 100 MG PO CAPS
100.0000 mg | ORAL_CAPSULE | Freq: Two times a day (BID) | ORAL | 1 refills | Status: AC
Start: 2022-09-01 — End: ?

## 2022-09-01 MED ORDER — GABAPENTIN 100 MG PO CAPS
100.0000 mg | ORAL_CAPSULE | Freq: Three times a day (TID) | ORAL | 1 refills | Status: AC
Start: 2022-09-01 — End: ?

## 2022-09-01 MED ORDER — DULOXETINE HCL 30 MG PO CPEP
60.0000 mg | ORAL_CAPSULE | Freq: Every day | ORAL | 1 refills | Status: AC
Start: 2022-09-01 — End: ?

## 2022-09-01 MED ORDER — ESOMEPRAZOLE MAGNESIUM 20 MG PO CPDR
20.0000 mg | DELAYED_RELEASE_CAPSULE | Freq: Two times a day (BID) | ORAL | 1 refills | Status: AC
Start: 2022-09-01 — End: ?

## 2022-09-01 MED ORDER — ALBUTEROL SULFATE HFA 108 (90 BASE) MCG/ACT IN AERS
INHALATION_SPRAY | RESPIRATORY_TRACT | 1 refills | Status: AC
Start: 2022-09-01 — End: ?

## 2022-09-01 MED ORDER — CYCLOBENZAPRINE HCL 10 MG PO TABS
10.0000 mg | ORAL_TABLET | Freq: Three times a day (TID) | ORAL | 1 refills | Status: AC | PRN
Start: 2022-09-01 — End: ?

## 2022-09-01 NOTE — Progress Notes (Unsigned)
New Patient Office Visit  Subjective    Patient ID: Jeremy Booker, male    DOB: 30-Nov-1965  Age: 56 y.o. MRN: [id number]  CC:  Chief Complaint  Patient presents with   Medication Refill    C/o spasms every morning, Full body-   Hx of COPD, having a hard time breathing  C/o- acid reflux  C/o ache feet, pain is on the bottom, and side     HPI Jeremy Booker states that he is currently being treated for substance abuse at [facility] residential treatment center, states that he arrived August 19, 2022, has not made any aftercare plans as of yet.  States that he takes Eliquis, has for the past 10 years, endorses history of right lung blood clots and history of atrial fibrillation.  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  States that he also has significant history of right lower back spasms, states that these have been ongoing for "years" states recently worse.  States that he has numbness and tingling down his right leg, hard to sit for long periods of time.  States that he has a history of diverticular, states that he had an episode of diverticulitis while incarcerated.  States that he currently has difficulty using the bathroom, states that he has to strain to go, has a bowel movement approximately every other day.  States that he drinks approximately 2 to 3 cups of water, has not tried anything else for relief.  States that his mood has been "on and off, states history of attention deficit along with depression.  States that he has difficulty sleeping both falling asleep and staying asleep.  States that this has been ongoing for many years.  Has previously failed melatonin.  States history of GERD, has not been on his medications and states that he has heartburn on an almost daily basis.        Outpatient Encounter Medications as of 09/01/2022  Medication Sig   docusate sodium (COLACE) 100 MG capsule Take 1 capsule (100 mg total)  by mouth 2 (two) times daily.   hydrOXYzine (ATARAX) 25 MG tablet Take 1 tablet (25 mg total) by mouth at bedtime as needed.   albuterol (PROVENTIL) (2.5 MG/3ML) 0.083% nebulizer solution TAKE 3 MLS BY NEBULIZATION EVERY 6 HOURS AS NEEDED FOR WHEEZING OR SHORTNESS OF BREATH.   albuterol (VENTOLIN HFA) 108 (90 Base) MCG/ACT inhaler INHALE 2 PUFFS INTO THE LUNGS EVERY 6 HOURS AS NEEDED FOR WHEEZING OR SHORTNESS OF BREATH.   amLODipine (NORVASC) 10 MG tablet Take 1 tablet (10 mg total) by mouth daily.   apixaban (ELIQUIS) 5 MG TABS tablet Take 1 tablet (5 mg total) by mouth 2 (two) times daily.   atorvastatin (LIPITOR) 40 MG tablet Take 1 tablet (40 mg total) by mouth daily.   cyclobenzaprine (FLEXERIL) 10 MG tablet Take 1 tablet (10 mg total) by mouth 3 (three) times daily as needed for muscle spasms.   diclofenac sodium (VOLTAREN) 1 % GEL Apply 4 g topically 4 (four) times daily.   DULoxetine (CYMBALTA) 30 MG capsule Take 2 capsules (60 mg total) by mouth daily.   esomeprazole (NEXIUM) 20 MG capsule Take 1 capsule (20 mg total) by mouth 2 (two) times daily before a meal.   gabapentin (NEURONTIN) 100 MG capsule Take 1 capsule (100 mg total) by mouth 3 (three) times daily.   Misc. Devices (WALKER) MISC Use as indicated   mometasone-formoterol (DULERA) 100-5  MCG/ACT AERO Inhale 2 puffs into the lungs 2 (two) times daily.   nitroGLYCERIN (NITROSTAT) 0.4 MG SL tablet Place 1 tablet (0.4 mg total) under the tongue every 5 (five) minutes as needed for chest pain. Daily max- 3 doses   [DISCONTINUED] albuterol (VENTOLIN HFA) 108 (90 Base) MCG/ACT inhaler INHALE 2 PUFFS INTO THE LUNGS EVERY 6 HOURS AS NEEDED FOR WHEEZING OR SHORTNESS OF BREATH.   [DISCONTINUED] amLODipine (NORVASC) 10 MG tablet Take 1 tablet (10 mg total) by mouth daily.   [DISCONTINUED] apixaban (ELIQUIS) 5 MG TABS tablet Take 1 tablet (5 mg total) by mouth 2 (two) times daily.   [DISCONTINUED] cyclobenzaprine (FLEXERIL) 10 MG tablet Take 1  tablet (10 mg total) by mouth 2 (two) times daily as needed for muscle spasms.   [DISCONTINUED] DULoxetine (CYMBALTA) 60 MG capsule Take 1 capsule (60 mg total) by mouth daily.   [DISCONTINUED] esomeprazole (NEXIUM) 20 MG capsule Take 1 capsule (20 mg total) by mouth 2 (two) times daily before a meal.   [DISCONTINUED] gabapentin (NEURONTIN) 300 MG capsule TAKE 2 CAPSULES BY MOUTH 2 TIMES DAILY.   [DISCONTINUED] mometasone-formoterol (DULERA) 100-5 MCG/ACT AERO Inhale 2 puffs into the lungs 2 (two) times daily.   [DISCONTINUED] predniSONE (DELTASONE) 20 MG tablet Take 2 tablets by mouth daily for 5 days.   [DISCONTINUED] traMADol (ULTRAM) 50 MG tablet TAKE 1 TABLET (50 MG TOTAL) BY MOUTH 2 TIMES DAILY AS NEEDED.   No facility-administered encounter medications on file as of 09/01/2022.    Past Medical History:  Diagnosis Date   Anxiety    Atrial fibrillation    a. Dx 01/2016 in setting of PE, cocaine.   Bicuspid aortic valve    CAD (coronary artery disease)    Chest pain    a. 2010 s/p cath in [place], [place] - reportedly nl.   Chronic chest pain    Cocaine abuse    Congenital heart disease    a. thinks he had "a hole in my heart" s/p surgical correction @ age 34 and then age 68. Per notes, suspected prior aortic interruption and had repair.   COPD (chronic obstructive pulmonary disease)    Depression    Drug abuse    a. 06/2014 Cocaine/Marijuana - last used a few months ago.   ETOH abuse    a. 06/2014 currently a few bottles of wine/week.   Hypertension        Pulmonary embolism    RBBB    Recurrent pulmonary emboli    a. R lung 06/2014. b. multiple RLL PEs 01/2016.   Tobacco abuse    a. 30 pack years. Quit    Tubular adenoma    a. s/p right hemicolectomy 01/2016, neg path for malignancy, positive for ischemia.    Past Surgical History:  Procedure Laterality Date   APPENDECTOMY     CARDIAC SURGERY     congenital    TONSILLECTOMY      Family History  Problem Relation Age of  Onset   Other Father        died in late 75's - ? cause   Hypertension Mother        alive   Clotting disorder Mother    Hypertension Maternal Uncle    Heart disease Maternal Uncle    Heart disease Sister    Cancer Brother        type unknown   Cancer Maternal Uncle        x 3, type unknown  Heart attack Neg Hx    Stroke Neg Hx     Social History   Socioeconomic History   Marital status: Single    Spouse name: Not on file   Number of children: 3   Years of education: Not on file   Highest education level: Not on file  Occupational History   Occupation: none  Tobacco Use   Smoking status: Former    Packs/day: 0.00    Years: 30.00    Additional pack years: 0.00    Total pack years: 0.00    Types: Cigarettes    Quit date: 09/29/2015    Years since quitting: 6.9   Smokeless tobacco: Never  Vaping Use   Vaping Use: Never used  Substance and Sexual Activity   Alcohol use: Yes   Drug use: No    Types: Cocaine, Marijuana   Sexual activity: Not on file  Other Topics Concern   Not on file  Social History Narrative   Was living in [place].  Now living with girlfriend in [place].  Unemployed and possibly pending disability.   Social Determinants of Health   Financial Resource Strain: Not on file  Food Insecurity: Not on file  Transportation Needs: Not on file  Physical Activity: Not on file  Stress: Not on file  Social Connections: Not on file  Intimate Partner Violence: Not on file    Review of Systems  Constitutional:  Negative for chills and fever.  HENT: Negative.    Eyes: Negative.   Respiratory:  Negative for shortness of breath.   Cardiovascular:  Negative for chest pain.  Gastrointestinal:  Positive for constipation and heartburn. Negative for abdominal pain, nausea and vomiting.  Genitourinary:  Negative for dysuria.  Musculoskeletal:  Positive for back pain.  Skin: Negative.   Neurological: Negative.   Endo/Heme/Allergies: Negative.    Psychiatric/Behavioral:  Negative for depression. The patient has insomnia. The patient is not nervous/anxious.         Objective    BP 128/85 (BP Location: Right Arm, Patient Position: Sitting, Cuff Size: Large)   Pulse 83   Ht  (1.778 m)   Wt 162 lb (73.5 kg)   BMI 23.24 kg/m   Physical Exam Vitals and nursing note reviewed.  Constitutional:      Appearance: Normal appearance.  HENT:     Head: Normocephalic and atraumatic.     Right Ear: External ear normal.     Left Ear: External ear normal.     Nose: Nose normal.     Mouth/Throat:     Mouth: Mucous membranes are moist.     Pharynx: Oropharynx is clear.  Eyes:     Extraocular Movements: Extraocular movements intact.     Conjunctiva/sclera: Conjunctivae normal.     Pupils: Pupils are equal, round, and reactive to light.  Cardiovascular:     Rate and Rhythm: Normal rate and regular rhythm.     Pulses: Normal pulses.          Dorsalis pedis pulses are 2+ on the right side and 2+ on the left side.       Posterior tibial pulses are 2+ on the right side and 2+ on the left side.     Heart sounds: Normal heart sounds.  Pulmonary:     Effort: Pulmonary effort is normal.     Breath sounds: Normal breath sounds.  Musculoskeletal:     Cervical back: Normal, normal range of motion and neck supple.  Thoracic back: Tenderness present. Decreased range of motion.     Lumbar back: Tenderness present. Decreased range of motion.     Comments: Pain elicited with range of motion testing  Skin:    General: Skin is warm and dry.  Neurological:     General: No focal deficit present.     Mental Status: He is alert and oriented to person, place, and time.  Psychiatric:        Mood and Affect: Mood normal.        Behavior: Behavior normal.        Thought Content: Thought content normal.        Judgment: Judgment normal.         Assessment & Plan:   Problem List Items Addressed This Visit       Cardiovascular and  Mediastinum   Essential hypertension - Primary   Relevant Medications   apixaban (ELIQUIS) 5 MG TABS tablet   amLODipine (NORVASC) 10 MG tablet   Other Relevant Orders   CBC with Differential/Platelet (Completed)   Comp. Metabolic Panel (12) (Completed)     Respiratory   COPD (chronic obstructive pulmonary disease) (Chronic)   Relevant Medications   mometasone-formoterol (DULERA) 100-5 MCG/ACT AERO   albuterol (VENTOLIN HFA) 108 (90 Base) MCG/ACT inhaler     Digestive   Gastro-esophageal reflux disease without esophagitis   Relevant Medications   esomeprazole (NEXIUM) 20 MG capsule   docusate sodium (COLACE) 100 MG capsule     Other   Chronic low back pain (Chronic)   Relevant Medications   gabapentin (NEURONTIN) 100 MG capsule   DULoxetine (CYMBALTA) 30 MG capsule   cyclobenzaprine (FLEXERIL) 10 MG tablet   Cocaine abuse (HCC) (Chronic)   Alcohol abuse   History of pulmonary embolism   Relevant Medications   apixaban (ELIQUIS) 5 MG TABS tablet   Psychophysiological insomnia   Relevant Medications   hydrOXYzine (ATARAX) 25 MG tablet   Marijuana abuse   Other Visit Diagnoses     Constipation, unspecified constipation type       Relevant Medications   docusate sodium (COLACE) 100 MG capsule      1. Essential hypertension Continue current regimen.  Patient encouraged to check blood pressure at home, keep a written log and have available for all office visits.  Patient to follow-up with mobile unit in 2 weeks. - CBC with Differential/Platelet - Comp. Metabolic Panel (12) - amLODipine (NORVASC) 10 MG tablet; Take 1 tablet (10 mg total) by mouth daily.  Dispense: 30 tablet; Refill: 1  2. Psychophysiological insomnia Trial hydroxyzine. - hydrOXYzine (ATARAX) 25 MG tablet; Take 1 tablet (25 mg total) by mouth at bedtime as needed.  Dispense: 30 tablet; Refill: 1  3. Chronic obstructive pulmonary disease, unspecified COPD type Continue current regimen -  mometasone-formoterol (DULERA) 100-5 MCG/ACT AERO; Inhale 2 puffs into the lungs 2 (two) times daily.  Dispense: 1 each; Refill: 1 - albuterol (VENTOLIN HFA) 108 (90 Base) MCG/ACT inhaler; INHALE 2 PUFFS INTO THE LUNGS EVERY 6 HOURS AS NEEDED FOR WHEEZING OR SHORTNESS OF BREATH.  Dispense: 18 g; Refill: 1  4. Chronic right-sided low back pain with right-sided sciatica Continue current regimen.  Patient will increase Flexeril to 3 times daily as needed. - gabapentin (NEURONTIN) 100 MG capsule; Take 1 capsule (100 mg total) by mouth 3 (three) times daily.  Dispense: 90 capsule; Refill: 1 - DULoxetine (CYMBALTA) 30 MG capsule; Take 2 capsules (60 mg total) by mouth daily.  Dispense: 30  capsule; Refill: 1 - cyclobenzaprine (FLEXERIL) 10 MG tablet; Take 1 tablet (10 mg total) by mouth 3 (three) times daily as needed for muscle spasms.  Dispense: 60 tablet; Refill: 1  5. Gastro-esophageal reflux disease without esophagitis Resume Nexium - esomeprazole (NEXIUM) 20 MG capsule; Take 1 capsule (20 mg total) by mouth 2 (two) times daily before a meal.  Dispense: 60 capsule; Refill: 1  6. Constipation, unspecified constipation type Trial Colace - docusate sodium (COLACE) 100 MG capsule; Take 1 capsule (100 mg total) by mouth 2 (two) times daily.  Dispense: 60 capsule; Refill: 1  7. History of pulmonary embolism Continue current regimen - apixaban (ELIQUIS) 5 MG TABS tablet; Take 1 tablet (5 mg total) by mouth 2 (two) times daily.  Dispense: 60 tablet; Refill: 1  8. Alcohol abuse Currently in substance abuse treatment program  9. Marijuana abuse   10. Cocaine abuse    I have reviewed the patient's medical history (PMH, PSH, Social History, Family History, Medications, and allergies) , and have been updated if relevant. I spent 40 minutes reviewing chart and  face to face time with patient.  No AVS was created, patient does not have access to MyChart, no printer available on screening van today.   Patient education given using teach back method   Return in about 2 weeks (around 09/15/2022) for with [facility].   Kasandra Knudsen Mayers, PA-C

## 2022-09-02 ENCOUNTER — Encounter: Payer: Self-pay | Admitting: Physician Assistant

## 2022-09-02 DIAGNOSIS — F1210 Cannabis abuse, uncomplicated: Secondary | ICD-10-CM | POA: Insufficient documentation

## 2022-09-02 DIAGNOSIS — F5104 Psychophysiologic insomnia: Secondary | ICD-10-CM | POA: Insufficient documentation

## 2022-09-02 LAB — COMP. METABOLIC PANEL (12)
AST: 69 IU/L — ABNORMAL HIGH (ref 0–40)
Albumin/Globulin Ratio: 1.5 (ref 1.2–2.2)
Albumin: 4.7 g/dL (ref 3.8–4.9)
Alkaline Phosphatase: 145 IU/L — ABNORMAL HIGH (ref 44–121)
BUN/Creatinine Ratio: 11 (ref 9–20)
BUN: 9 mg/dL (ref 6–24)
Bilirubin Total: 0.3 mg/dL (ref 0.0–1.2)
Calcium: 10.2 mg/dL (ref 8.7–10.2)
Chloride: 99 mmol/L (ref 96–106)
Creatinine, Ser: 0.83 mg/dL (ref 0.76–1.27)
Globulin, Total: 3.1 g/dL (ref 1.5–4.5)
Glucose: 59 mg/dL — ABNORMAL LOW (ref 70–99)
Potassium: 5.1 mmol/L (ref 3.5–5.2)
Sodium: 140 mmol/L (ref 134–144)
Total Protein: 7.8 g/dL (ref 6.0–8.5)
eGFR: 103 mL/min/{1.73_m2} (ref >59)

## 2022-09-02 LAB — CBC WITH DIFFERENTIAL/PLATELET
Basophils Absolute: 0 10*3/uL (ref 0.0–0.2)
Basos: 1 %
EOS (ABSOLUTE): 0.1 10*3/uL (ref 0.0–0.4)
Eos: 3 %
Hematocrit: 48.5 % (ref 37.5–51.0)
Hemoglobin: 15.9 g/dL (ref 13.0–17.7)
Immature Grans (Abs): 0 10*3/uL (ref 0.0–0.1)
Immature Granulocytes: 0 %
Lymphocytes Absolute: 1.4 10*3/uL (ref 0.7–3.1)
Lymphs: 31 %
MCH: 31.3 pg (ref 26.6–33.0)
MCHC: 32.8 g/dL (ref 31.5–35.7)
MCV: 96 fL (ref 79–97)
Monocytes Absolute: 0.7 10*3/uL (ref 0.1–0.9)
Monocytes: 15 %
Neutrophils Absolute: 2.3 10*3/uL (ref 1.4–7.0)
Neutrophils: 50 %
Platelets: 288 10*3/uL (ref 150–450)
RBC: 5.08 x10E6/uL (ref 4.14–5.80)
RDW: 13.1 % (ref 11.6–15.4)
WBC: 4.6 10*3/uL (ref 3.4–10.8)
